# Patient Record
Sex: Female | Born: 1935 | Race: White | Hispanic: No | Smoking: Never smoker
Health system: Southern US, Community
[De-identification: ages and names within clinical notes are randomized; demographics above are authoritative.]

## PROBLEM LIST (undated history)

## (undated) DIAGNOSIS — R1310 Dysphagia, unspecified: Secondary | ICD-10-CM

## (undated) DIAGNOSIS — I10 Essential (primary) hypertension: Secondary | ICD-10-CM

## (undated) DIAGNOSIS — E785 Hyperlipidemia, unspecified: Secondary | ICD-10-CM

## (undated) DIAGNOSIS — M1990 Unspecified osteoarthritis, unspecified site: Secondary | ICD-10-CM

## (undated) DIAGNOSIS — I2510 Atherosclerotic heart disease of native coronary artery without angina pectoris: Secondary | ICD-10-CM

## (undated) DIAGNOSIS — I517 Cardiomegaly: Secondary | ICD-10-CM

## (undated) DIAGNOSIS — T7840XA Allergy, unspecified, initial encounter: Secondary | ICD-10-CM

## (undated) DIAGNOSIS — R0600 Dyspnea, unspecified: Secondary | ICD-10-CM

## (undated) DIAGNOSIS — D72820 Lymphocytosis (symptomatic): Secondary | ICD-10-CM

## (undated) DIAGNOSIS — I272 Pulmonary hypertension, unspecified: Secondary | ICD-10-CM

## (undated) DIAGNOSIS — E871 Hypo-osmolality and hyponatremia: Secondary | ICD-10-CM

## (undated) DIAGNOSIS — M810 Age-related osteoporosis without current pathological fracture: Secondary | ICD-10-CM

## (undated) DIAGNOSIS — E781 Pure hyperglyceridemia: Secondary | ICD-10-CM

## (undated) DIAGNOSIS — N289 Disorder of kidney and ureter, unspecified: Secondary | ICD-10-CM

## (undated) DIAGNOSIS — R809 Proteinuria, unspecified: Secondary | ICD-10-CM

## (undated) DIAGNOSIS — F419 Anxiety disorder, unspecified: Secondary | ICD-10-CM

## (undated) DIAGNOSIS — D72821 Monocytosis (symptomatic): Secondary | ICD-10-CM

## (undated) DIAGNOSIS — I509 Heart failure, unspecified: Secondary | ICD-10-CM

## (undated) DIAGNOSIS — J449 Chronic obstructive pulmonary disease, unspecified: Secondary | ICD-10-CM

## (undated) HISTORY — DX: Atherosclerotic heart disease of native coronary artery without angina pectoris: I25.10

## (undated) HISTORY — DX: Dysphagia, unspecified: R13.10

## (undated) HISTORY — PX: TUBAL LIGATION: SHX77

## (undated) HISTORY — DX: Pulmonary hypertension, unspecified: I27.20

## (undated) HISTORY — PX: OTHER SURGICAL HISTORY: SHX169

## (undated) HISTORY — DX: Allergy, unspecified, initial encounter: T78.40XA

## (undated) HISTORY — DX: Anxiety disorder, unspecified: F41.9

## (undated) HISTORY — DX: Pure hyperglyceridemia: E78.1

## (undated) HISTORY — DX: Chronic obstructive pulmonary disease, unspecified: J44.9

## (undated) HISTORY — DX: Lymphocytosis (symptomatic): D72.820

## (undated) HISTORY — DX: Hypo-osmolality and hyponatremia: E87.1

## (undated) HISTORY — PX: APPENDECTOMY: SHX54

## (undated) HISTORY — DX: Proteinuria, unspecified: R80.9

## (undated) HISTORY — PX: TONSILLECTOMY: SUR1361

## (undated) HISTORY — DX: Age-related osteoporosis without current pathological fracture: M81.0

## (undated) HISTORY — DX: Dyspnea, unspecified: R06.00

## (undated) HISTORY — DX: Monocytosis (symptomatic): D72.821

## (undated) HISTORY — DX: Hyperlipidemia, unspecified: E78.5

## (undated) HISTORY — DX: Cardiomegaly: I51.7

---

## 2003-06-06 ENCOUNTER — Inpatient Hospital Stay (HOSPITAL_COMMUNITY): Admission: EM | Admit: 2003-06-06 | Discharge: 2003-06-07 | Payer: Self-pay | Admitting: Cardiovascular Disease

## 2004-04-18 ENCOUNTER — Other Ambulatory Visit: Payer: Self-pay

## 2004-04-22 ENCOUNTER — Other Ambulatory Visit: Payer: Self-pay

## 2004-04-23 ENCOUNTER — Other Ambulatory Visit: Payer: Self-pay

## 2004-05-09 ENCOUNTER — Other Ambulatory Visit: Payer: Self-pay

## 2004-05-10 ENCOUNTER — Other Ambulatory Visit: Payer: Self-pay

## 2005-08-27 ENCOUNTER — Ambulatory Visit: Payer: Self-pay | Admitting: Cardiovascular Disease

## 2005-09-01 ENCOUNTER — Ambulatory Visit: Payer: Self-pay | Admitting: Cardiovascular Disease

## 2006-05-18 ENCOUNTER — Ambulatory Visit: Payer: Self-pay | Admitting: Ophthalmology

## 2006-05-25 ENCOUNTER — Ambulatory Visit: Payer: Self-pay | Admitting: Ophthalmology

## 2007-08-18 ENCOUNTER — Ambulatory Visit: Payer: Self-pay | Admitting: Specialist

## 2007-11-07 ENCOUNTER — Inpatient Hospital Stay: Payer: Self-pay | Admitting: Cardiovascular Disease

## 2007-11-07 ENCOUNTER — Other Ambulatory Visit: Payer: Self-pay

## 2008-01-31 ENCOUNTER — Ambulatory Visit: Payer: Self-pay | Admitting: Gastroenterology

## 2009-05-14 ENCOUNTER — Ambulatory Visit: Payer: Self-pay | Admitting: Gastroenterology

## 2009-05-26 ENCOUNTER — Ambulatory Visit: Payer: Self-pay | Admitting: Gastroenterology

## 2009-09-11 ENCOUNTER — Inpatient Hospital Stay: Payer: Self-pay | Admitting: Internal Medicine

## 2009-09-11 ENCOUNTER — Ambulatory Visit: Payer: Self-pay | Admitting: Internal Medicine

## 2010-03-05 ENCOUNTER — Emergency Department: Payer: Self-pay | Admitting: Emergency Medicine

## 2010-06-28 ENCOUNTER — Emergency Department: Payer: Self-pay | Admitting: Emergency Medicine

## 2010-12-14 ENCOUNTER — Ambulatory Visit: Payer: Self-pay

## 2011-02-08 ENCOUNTER — Ambulatory Visit: Payer: Self-pay | Admitting: Nephrology

## 2011-04-28 ENCOUNTER — Ambulatory Visit: Payer: Self-pay | Admitting: Cardiovascular Disease

## 2011-10-07 DIAGNOSIS — R0789 Other chest pain: Secondary | ICD-10-CM | POA: Diagnosis not present

## 2011-10-07 DIAGNOSIS — I2510 Atherosclerotic heart disease of native coronary artery without angina pectoris: Secondary | ICD-10-CM | POA: Diagnosis not present

## 2011-10-07 DIAGNOSIS — E785 Hyperlipidemia, unspecified: Secondary | ICD-10-CM | POA: Diagnosis not present

## 2011-10-07 DIAGNOSIS — I10 Essential (primary) hypertension: Secondary | ICD-10-CM | POA: Diagnosis not present

## 2011-12-06 DIAGNOSIS — J019 Acute sinusitis, unspecified: Secondary | ICD-10-CM | POA: Diagnosis not present

## 2011-12-23 DIAGNOSIS — R079 Chest pain, unspecified: Secondary | ICD-10-CM | POA: Diagnosis not present

## 2011-12-23 DIAGNOSIS — E785 Hyperlipidemia, unspecified: Secondary | ICD-10-CM | POA: Diagnosis not present

## 2011-12-23 DIAGNOSIS — I2510 Atherosclerotic heart disease of native coronary artery without angina pectoris: Secondary | ICD-10-CM | POA: Diagnosis not present

## 2011-12-23 DIAGNOSIS — I10 Essential (primary) hypertension: Secondary | ICD-10-CM | POA: Diagnosis not present

## 2011-12-28 DIAGNOSIS — R55 Syncope and collapse: Secondary | ICD-10-CM | POA: Diagnosis not present

## 2012-01-06 DIAGNOSIS — I359 Nonrheumatic aortic valve disorder, unspecified: Secondary | ICD-10-CM | POA: Diagnosis not present

## 2012-01-06 DIAGNOSIS — N179 Acute kidney failure, unspecified: Secondary | ICD-10-CM | POA: Diagnosis not present

## 2012-01-12 DIAGNOSIS — I2510 Atherosclerotic heart disease of native coronary artery without angina pectoris: Secondary | ICD-10-CM | POA: Diagnosis not present

## 2012-01-12 DIAGNOSIS — I10 Essential (primary) hypertension: Secondary | ICD-10-CM | POA: Diagnosis not present

## 2012-01-12 DIAGNOSIS — E785 Hyperlipidemia, unspecified: Secondary | ICD-10-CM | POA: Diagnosis not present

## 2012-01-12 DIAGNOSIS — R5383 Other fatigue: Secondary | ICD-10-CM | POA: Diagnosis not present

## 2012-01-12 DIAGNOSIS — Z0000 Encounter for general adult medical examination without abnormal findings: Secondary | ICD-10-CM | POA: Diagnosis not present

## 2012-01-12 DIAGNOSIS — R5381 Other malaise: Secondary | ICD-10-CM | POA: Diagnosis not present

## 2012-01-12 DIAGNOSIS — H6120 Impacted cerumen, unspecified ear: Secondary | ICD-10-CM | POA: Diagnosis not present

## 2012-02-10 DIAGNOSIS — I2510 Atherosclerotic heart disease of native coronary artery without angina pectoris: Secondary | ICD-10-CM | POA: Diagnosis not present

## 2012-02-10 DIAGNOSIS — R011 Cardiac murmur, unspecified: Secondary | ICD-10-CM | POA: Diagnosis not present

## 2012-02-10 DIAGNOSIS — I10 Essential (primary) hypertension: Secondary | ICD-10-CM | POA: Diagnosis not present

## 2012-02-10 DIAGNOSIS — E785 Hyperlipidemia, unspecified: Secondary | ICD-10-CM | POA: Diagnosis not present

## 2012-02-10 DIAGNOSIS — R9431 Abnormal electrocardiogram [ECG] [EKG]: Secondary | ICD-10-CM | POA: Diagnosis not present

## 2012-02-17 DIAGNOSIS — R002 Palpitations: Secondary | ICD-10-CM | POA: Diagnosis not present

## 2012-02-17 DIAGNOSIS — R42 Dizziness and giddiness: Secondary | ICD-10-CM | POA: Diagnosis not present

## 2012-02-24 ENCOUNTER — Ambulatory Visit: Payer: Self-pay | Admitting: Family Medicine

## 2012-02-24 DIAGNOSIS — I10 Essential (primary) hypertension: Secondary | ICD-10-CM | POA: Diagnosis not present

## 2012-02-24 DIAGNOSIS — R609 Edema, unspecified: Secondary | ICD-10-CM | POA: Diagnosis not present

## 2012-02-24 DIAGNOSIS — M7989 Other specified soft tissue disorders: Secondary | ICD-10-CM | POA: Diagnosis not present

## 2012-02-24 DIAGNOSIS — M259 Joint disorder, unspecified: Secondary | ICD-10-CM | POA: Diagnosis not present

## 2012-02-24 DIAGNOSIS — M25579 Pain in unspecified ankle and joints of unspecified foot: Secondary | ICD-10-CM | POA: Diagnosis not present

## 2012-02-24 DIAGNOSIS — G8929 Other chronic pain: Secondary | ICD-10-CM | POA: Diagnosis not present

## 2012-02-24 DIAGNOSIS — R937 Abnormal findings on diagnostic imaging of other parts of musculoskeletal system: Secondary | ICD-10-CM | POA: Diagnosis not present

## 2012-03-17 DIAGNOSIS — R002 Palpitations: Secondary | ICD-10-CM | POA: Diagnosis not present

## 2012-03-17 DIAGNOSIS — R0602 Shortness of breath: Secondary | ICD-10-CM | POA: Diagnosis not present

## 2012-03-17 DIAGNOSIS — I2510 Atherosclerotic heart disease of native coronary artery without angina pectoris: Secondary | ICD-10-CM | POA: Diagnosis not present

## 2012-03-17 DIAGNOSIS — I10 Essential (primary) hypertension: Secondary | ICD-10-CM | POA: Diagnosis not present

## 2012-03-20 DIAGNOSIS — R002 Palpitations: Secondary | ICD-10-CM | POA: Diagnosis not present

## 2012-03-28 DIAGNOSIS — I10 Essential (primary) hypertension: Secondary | ICD-10-CM | POA: Diagnosis not present

## 2012-03-28 DIAGNOSIS — E785 Hyperlipidemia, unspecified: Secondary | ICD-10-CM | POA: Diagnosis not present

## 2012-03-28 DIAGNOSIS — I2510 Atherosclerotic heart disease of native coronary artery without angina pectoris: Secondary | ICD-10-CM | POA: Diagnosis not present

## 2012-03-29 DIAGNOSIS — R05 Cough: Secondary | ICD-10-CM | POA: Diagnosis not present

## 2012-04-27 DIAGNOSIS — R5381 Other malaise: Secondary | ICD-10-CM | POA: Diagnosis not present

## 2012-04-27 DIAGNOSIS — E785 Hyperlipidemia, unspecified: Secondary | ICD-10-CM | POA: Diagnosis not present

## 2012-04-27 DIAGNOSIS — I2510 Atherosclerotic heart disease of native coronary artery without angina pectoris: Secondary | ICD-10-CM | POA: Diagnosis not present

## 2012-04-27 DIAGNOSIS — I10 Essential (primary) hypertension: Secondary | ICD-10-CM | POA: Diagnosis not present

## 2012-04-27 DIAGNOSIS — IMO0001 Reserved for inherently not codable concepts without codable children: Secondary | ICD-10-CM | POA: Diagnosis not present

## 2012-05-25 DIAGNOSIS — G4710 Hypersomnia, unspecified: Secondary | ICD-10-CM | POA: Diagnosis not present

## 2012-07-04 DIAGNOSIS — I10 Essential (primary) hypertension: Secondary | ICD-10-CM | POA: Diagnosis not present

## 2012-07-04 DIAGNOSIS — E785 Hyperlipidemia, unspecified: Secondary | ICD-10-CM | POA: Diagnosis not present

## 2012-07-06 DIAGNOSIS — E785 Hyperlipidemia, unspecified: Secondary | ICD-10-CM | POA: Diagnosis not present

## 2012-07-06 DIAGNOSIS — I10 Essential (primary) hypertension: Secondary | ICD-10-CM | POA: Diagnosis not present

## 2012-07-06 DIAGNOSIS — I2510 Atherosclerotic heart disease of native coronary artery without angina pectoris: Secondary | ICD-10-CM | POA: Diagnosis not present

## 2012-07-06 DIAGNOSIS — Z23 Encounter for immunization: Secondary | ICD-10-CM | POA: Diagnosis not present

## 2012-07-20 DIAGNOSIS — G4761 Periodic limb movement disorder: Secondary | ICD-10-CM | POA: Diagnosis not present

## 2012-07-20 DIAGNOSIS — G4733 Obstructive sleep apnea (adult) (pediatric): Secondary | ICD-10-CM | POA: Diagnosis not present

## 2012-07-31 DIAGNOSIS — D239 Other benign neoplasm of skin, unspecified: Secondary | ICD-10-CM | POA: Diagnosis not present

## 2012-07-31 DIAGNOSIS — L821 Other seborrheic keratosis: Secondary | ICD-10-CM | POA: Diagnosis not present

## 2012-07-31 DIAGNOSIS — L570 Actinic keratosis: Secondary | ICD-10-CM | POA: Diagnosis not present

## 2012-07-31 DIAGNOSIS — D692 Other nonthrombocytopenic purpura: Secondary | ICD-10-CM | POA: Diagnosis not present

## 2012-07-31 DIAGNOSIS — D1800 Hemangioma unspecified site: Secondary | ICD-10-CM | POA: Diagnosis not present

## 2012-07-31 DIAGNOSIS — L723 Sebaceous cyst: Secondary | ICD-10-CM | POA: Diagnosis not present

## 2012-07-31 DIAGNOSIS — L820 Inflamed seborrheic keratosis: Secondary | ICD-10-CM | POA: Diagnosis not present

## 2012-07-31 DIAGNOSIS — I8310 Varicose veins of unspecified lower extremity with inflammation: Secondary | ICD-10-CM | POA: Diagnosis not present

## 2012-08-10 DIAGNOSIS — J019 Acute sinusitis, unspecified: Secondary | ICD-10-CM | POA: Diagnosis not present

## 2012-08-11 ENCOUNTER — Ambulatory Visit: Payer: Self-pay | Admitting: Family Medicine

## 2012-08-11 DIAGNOSIS — J3489 Other specified disorders of nose and nasal sinuses: Secondary | ICD-10-CM | POA: Diagnosis not present

## 2012-08-11 DIAGNOSIS — R059 Cough, unspecified: Secondary | ICD-10-CM | POA: Diagnosis not present

## 2012-08-11 DIAGNOSIS — R05 Cough: Secondary | ICD-10-CM | POA: Diagnosis not present

## 2012-08-11 DIAGNOSIS — R0989 Other specified symptoms and signs involving the circulatory and respiratory systems: Secondary | ICD-10-CM | POA: Diagnosis not present

## 2012-08-11 DIAGNOSIS — R5381 Other malaise: Secondary | ICD-10-CM | POA: Diagnosis not present

## 2012-08-16 DIAGNOSIS — G4733 Obstructive sleep apnea (adult) (pediatric): Secondary | ICD-10-CM | POA: Diagnosis not present

## 2012-08-18 DIAGNOSIS — H04129 Dry eye syndrome of unspecified lacrimal gland: Secondary | ICD-10-CM | POA: Diagnosis not present

## 2012-08-24 DIAGNOSIS — I2510 Atherosclerotic heart disease of native coronary artery without angina pectoris: Secondary | ICD-10-CM | POA: Diagnosis not present

## 2012-08-24 DIAGNOSIS — E785 Hyperlipidemia, unspecified: Secondary | ICD-10-CM | POA: Diagnosis not present

## 2012-08-24 DIAGNOSIS — I10 Essential (primary) hypertension: Secondary | ICD-10-CM | POA: Diagnosis not present

## 2012-09-08 DIAGNOSIS — G4700 Insomnia, unspecified: Secondary | ICD-10-CM | POA: Diagnosis not present

## 2012-09-08 DIAGNOSIS — J019 Acute sinusitis, unspecified: Secondary | ICD-10-CM | POA: Diagnosis not present

## 2012-09-26 DIAGNOSIS — J019 Acute sinusitis, unspecified: Secondary | ICD-10-CM | POA: Diagnosis not present

## 2012-09-26 DIAGNOSIS — R5383 Other fatigue: Secondary | ICD-10-CM | POA: Diagnosis not present

## 2012-09-26 DIAGNOSIS — R5381 Other malaise: Secondary | ICD-10-CM | POA: Diagnosis not present

## 2012-10-11 DIAGNOSIS — R209 Unspecified disturbances of skin sensation: Secondary | ICD-10-CM | POA: Diagnosis not present

## 2012-10-11 DIAGNOSIS — I2510 Atherosclerotic heart disease of native coronary artery without angina pectoris: Secondary | ICD-10-CM | POA: Diagnosis not present

## 2012-10-11 DIAGNOSIS — R002 Palpitations: Secondary | ICD-10-CM | POA: Diagnosis not present

## 2012-10-11 DIAGNOSIS — I10 Essential (primary) hypertension: Secondary | ICD-10-CM | POA: Diagnosis not present

## 2012-10-12 DIAGNOSIS — R51 Headache: Secondary | ICD-10-CM | POA: Diagnosis not present

## 2012-10-12 DIAGNOSIS — J329 Chronic sinusitis, unspecified: Secondary | ICD-10-CM | POA: Diagnosis not present

## 2012-10-13 DIAGNOSIS — E785 Hyperlipidemia, unspecified: Secondary | ICD-10-CM | POA: Diagnosis not present

## 2012-10-13 DIAGNOSIS — G4730 Sleep apnea, unspecified: Secondary | ICD-10-CM | POA: Diagnosis not present

## 2012-10-13 DIAGNOSIS — I10 Essential (primary) hypertension: Secondary | ICD-10-CM | POA: Diagnosis not present

## 2012-10-13 DIAGNOSIS — I2510 Atherosclerotic heart disease of native coronary artery without angina pectoris: Secondary | ICD-10-CM | POA: Diagnosis not present

## 2012-10-17 DIAGNOSIS — G4733 Obstructive sleep apnea (adult) (pediatric): Secondary | ICD-10-CM | POA: Diagnosis not present

## 2012-10-17 DIAGNOSIS — R0982 Postnasal drip: Secondary | ICD-10-CM | POA: Diagnosis not present

## 2012-10-17 DIAGNOSIS — H6120 Impacted cerumen, unspecified ear: Secondary | ICD-10-CM | POA: Diagnosis not present

## 2012-10-17 DIAGNOSIS — J310 Chronic rhinitis: Secondary | ICD-10-CM | POA: Diagnosis not present

## 2012-10-30 DIAGNOSIS — I2510 Atherosclerotic heart disease of native coronary artery without angina pectoris: Secondary | ICD-10-CM | POA: Diagnosis not present

## 2012-10-30 DIAGNOSIS — G4730 Sleep apnea, unspecified: Secondary | ICD-10-CM | POA: Diagnosis not present

## 2012-10-30 DIAGNOSIS — I10 Essential (primary) hypertension: Secondary | ICD-10-CM | POA: Diagnosis not present

## 2012-10-30 DIAGNOSIS — E785 Hyperlipidemia, unspecified: Secondary | ICD-10-CM | POA: Diagnosis not present

## 2012-12-12 DIAGNOSIS — G4730 Sleep apnea, unspecified: Secondary | ICD-10-CM | POA: Diagnosis not present

## 2012-12-12 DIAGNOSIS — I2510 Atherosclerotic heart disease of native coronary artery without angina pectoris: Secondary | ICD-10-CM | POA: Diagnosis not present

## 2012-12-12 DIAGNOSIS — I10 Essential (primary) hypertension: Secondary | ICD-10-CM | POA: Diagnosis not present

## 2012-12-12 DIAGNOSIS — Z79899 Other long term (current) drug therapy: Secondary | ICD-10-CM | POA: Diagnosis not present

## 2012-12-12 DIAGNOSIS — E785 Hyperlipidemia, unspecified: Secondary | ICD-10-CM | POA: Diagnosis not present

## 2012-12-12 DIAGNOSIS — R0602 Shortness of breath: Secondary | ICD-10-CM | POA: Diagnosis not present

## 2012-12-21 DIAGNOSIS — R072 Precordial pain: Secondary | ICD-10-CM | POA: Diagnosis not present

## 2012-12-28 DIAGNOSIS — I2510 Atherosclerotic heart disease of native coronary artery without angina pectoris: Secondary | ICD-10-CM | POA: Diagnosis not present

## 2012-12-28 DIAGNOSIS — G4730 Sleep apnea, unspecified: Secondary | ICD-10-CM | POA: Diagnosis not present

## 2012-12-28 DIAGNOSIS — E785 Hyperlipidemia, unspecified: Secondary | ICD-10-CM | POA: Diagnosis not present

## 2012-12-28 DIAGNOSIS — I10 Essential (primary) hypertension: Secondary | ICD-10-CM | POA: Diagnosis not present

## 2013-01-29 DIAGNOSIS — L820 Inflamed seborrheic keratosis: Secondary | ICD-10-CM | POA: Diagnosis not present

## 2013-01-29 DIAGNOSIS — I8310 Varicose veins of unspecified lower extremity with inflammation: Secondary | ICD-10-CM | POA: Diagnosis not present

## 2013-01-29 DIAGNOSIS — D239 Other benign neoplasm of skin, unspecified: Secondary | ICD-10-CM | POA: Diagnosis not present

## 2013-01-29 DIAGNOSIS — L570 Actinic keratosis: Secondary | ICD-10-CM | POA: Diagnosis not present

## 2013-01-29 DIAGNOSIS — L821 Other seborrheic keratosis: Secondary | ICD-10-CM | POA: Diagnosis not present

## 2013-01-29 DIAGNOSIS — Z85828 Personal history of other malignant neoplasm of skin: Secondary | ICD-10-CM | POA: Diagnosis not present

## 2013-01-30 DIAGNOSIS — G4730 Sleep apnea, unspecified: Secondary | ICD-10-CM | POA: Diagnosis not present

## 2013-01-30 DIAGNOSIS — I2510 Atherosclerotic heart disease of native coronary artery without angina pectoris: Secondary | ICD-10-CM | POA: Diagnosis not present

## 2013-01-30 DIAGNOSIS — I10 Essential (primary) hypertension: Secondary | ICD-10-CM | POA: Diagnosis not present

## 2013-01-30 DIAGNOSIS — E785 Hyperlipidemia, unspecified: Secondary | ICD-10-CM | POA: Diagnosis not present

## 2013-02-02 DIAGNOSIS — R9430 Abnormal result of cardiovascular function study, unspecified: Secondary | ICD-10-CM | POA: Diagnosis not present

## 2013-02-08 DIAGNOSIS — I2510 Atherosclerotic heart disease of native coronary artery without angina pectoris: Secondary | ICD-10-CM | POA: Diagnosis not present

## 2013-02-08 DIAGNOSIS — I10 Essential (primary) hypertension: Secondary | ICD-10-CM | POA: Diagnosis not present

## 2013-02-08 DIAGNOSIS — E785 Hyperlipidemia, unspecified: Secondary | ICD-10-CM | POA: Diagnosis not present

## 2013-02-08 DIAGNOSIS — G4730 Sleep apnea, unspecified: Secondary | ICD-10-CM | POA: Diagnosis not present

## 2013-02-12 DIAGNOSIS — C8177 Other classical Hodgkin lymphoma, spleen: Secondary | ICD-10-CM | POA: Diagnosis not present

## 2013-05-14 DIAGNOSIS — R0602 Shortness of breath: Secondary | ICD-10-CM | POA: Diagnosis not present

## 2013-05-14 DIAGNOSIS — E785 Hyperlipidemia, unspecified: Secondary | ICD-10-CM | POA: Diagnosis not present

## 2013-05-14 DIAGNOSIS — I2510 Atherosclerotic heart disease of native coronary artery without angina pectoris: Secondary | ICD-10-CM | POA: Diagnosis not present

## 2013-05-14 DIAGNOSIS — I10 Essential (primary) hypertension: Secondary | ICD-10-CM | POA: Diagnosis not present

## 2013-06-08 DIAGNOSIS — Z0000 Encounter for general adult medical examination without abnormal findings: Secondary | ICD-10-CM | POA: Diagnosis not present

## 2013-06-08 DIAGNOSIS — M810 Age-related osteoporosis without current pathological fracture: Secondary | ICD-10-CM | POA: Diagnosis not present

## 2013-06-08 DIAGNOSIS — I10 Essential (primary) hypertension: Secondary | ICD-10-CM | POA: Diagnosis not present

## 2013-06-08 DIAGNOSIS — I2510 Atherosclerotic heart disease of native coronary artery without angina pectoris: Secondary | ICD-10-CM | POA: Diagnosis not present

## 2013-06-08 DIAGNOSIS — E785 Hyperlipidemia, unspecified: Secondary | ICD-10-CM | POA: Diagnosis not present

## 2013-06-08 DIAGNOSIS — F411 Generalized anxiety disorder: Secondary | ICD-10-CM | POA: Diagnosis not present

## 2013-06-12 DIAGNOSIS — Z23 Encounter for immunization: Secondary | ICD-10-CM | POA: Diagnosis not present

## 2013-06-12 DIAGNOSIS — I2510 Atherosclerotic heart disease of native coronary artery without angina pectoris: Secondary | ICD-10-CM | POA: Diagnosis not present

## 2013-06-12 DIAGNOSIS — Z0000 Encounter for general adult medical examination without abnormal findings: Secondary | ICD-10-CM | POA: Diagnosis not present

## 2013-06-21 ENCOUNTER — Ambulatory Visit: Payer: Self-pay | Admitting: Family Medicine

## 2013-06-21 DIAGNOSIS — M810 Age-related osteoporosis without current pathological fracture: Secondary | ICD-10-CM | POA: Diagnosis not present

## 2013-07-13 DIAGNOSIS — B9789 Other viral agents as the cause of diseases classified elsewhere: Secondary | ICD-10-CM | POA: Diagnosis not present

## 2013-07-13 DIAGNOSIS — H6120 Impacted cerumen, unspecified ear: Secondary | ICD-10-CM | POA: Diagnosis not present

## 2013-07-18 DIAGNOSIS — J069 Acute upper respiratory infection, unspecified: Secondary | ICD-10-CM | POA: Diagnosis not present

## 2013-07-18 DIAGNOSIS — R0602 Shortness of breath: Secondary | ICD-10-CM | POA: Diagnosis not present

## 2013-07-31 DIAGNOSIS — D485 Neoplasm of uncertain behavior of skin: Secondary | ICD-10-CM | POA: Diagnosis not present

## 2013-07-31 DIAGNOSIS — C44519 Basal cell carcinoma of skin of other part of trunk: Secondary | ICD-10-CM | POA: Diagnosis not present

## 2013-07-31 DIAGNOSIS — L570 Actinic keratosis: Secondary | ICD-10-CM | POA: Diagnosis not present

## 2013-07-31 DIAGNOSIS — L821 Other seborrheic keratosis: Secondary | ICD-10-CM | POA: Diagnosis not present

## 2013-07-31 DIAGNOSIS — Z85828 Personal history of other malignant neoplasm of skin: Secondary | ICD-10-CM | POA: Diagnosis not present

## 2013-07-31 DIAGNOSIS — D239 Other benign neoplasm of skin, unspecified: Secondary | ICD-10-CM | POA: Diagnosis not present

## 2013-09-10 DIAGNOSIS — D239 Other benign neoplasm of skin, unspecified: Secondary | ICD-10-CM | POA: Diagnosis not present

## 2013-09-10 DIAGNOSIS — C44319 Basal cell carcinoma of skin of other parts of face: Secondary | ICD-10-CM | POA: Diagnosis not present

## 2013-09-10 DIAGNOSIS — Z85828 Personal history of other malignant neoplasm of skin: Secondary | ICD-10-CM | POA: Diagnosis not present

## 2013-09-13 DIAGNOSIS — E785 Hyperlipidemia, unspecified: Secondary | ICD-10-CM | POA: Diagnosis not present

## 2013-09-13 DIAGNOSIS — I2510 Atherosclerotic heart disease of native coronary artery without angina pectoris: Secondary | ICD-10-CM | POA: Diagnosis not present

## 2013-09-13 DIAGNOSIS — I10 Essential (primary) hypertension: Secondary | ICD-10-CM | POA: Diagnosis not present

## 2013-09-13 DIAGNOSIS — K3189 Other diseases of stomach and duodenum: Secondary | ICD-10-CM | POA: Diagnosis not present

## 2013-09-13 DIAGNOSIS — R072 Precordial pain: Secondary | ICD-10-CM | POA: Diagnosis not present

## 2013-10-16 DIAGNOSIS — J449 Chronic obstructive pulmonary disease, unspecified: Secondary | ICD-10-CM | POA: Diagnosis not present

## 2013-10-22 DIAGNOSIS — J449 Chronic obstructive pulmonary disease, unspecified: Secondary | ICD-10-CM | POA: Diagnosis not present

## 2013-10-22 DIAGNOSIS — I10 Essential (primary) hypertension: Secondary | ICD-10-CM | POA: Diagnosis not present

## 2013-10-22 DIAGNOSIS — E559 Vitamin D deficiency, unspecified: Secondary | ICD-10-CM | POA: Diagnosis not present

## 2013-10-22 DIAGNOSIS — R0602 Shortness of breath: Secondary | ICD-10-CM | POA: Diagnosis not present

## 2013-10-22 DIAGNOSIS — E785 Hyperlipidemia, unspecified: Secondary | ICD-10-CM | POA: Diagnosis not present

## 2013-10-22 DIAGNOSIS — D518 Other vitamin B12 deficiency anemias: Secondary | ICD-10-CM | POA: Diagnosis not present

## 2013-10-25 ENCOUNTER — Ambulatory Visit: Payer: Self-pay | Admitting: Family Medicine

## 2013-10-25 DIAGNOSIS — K449 Diaphragmatic hernia without obstruction or gangrene: Secondary | ICD-10-CM | POA: Diagnosis not present

## 2013-10-25 DIAGNOSIS — R0609 Other forms of dyspnea: Secondary | ICD-10-CM | POA: Diagnosis not present

## 2013-10-25 DIAGNOSIS — E785 Hyperlipidemia, unspecified: Secondary | ICD-10-CM | POA: Diagnosis not present

## 2013-10-25 DIAGNOSIS — Q619 Cystic kidney disease, unspecified: Secondary | ICD-10-CM | POA: Diagnosis not present

## 2013-10-25 DIAGNOSIS — G4730 Sleep apnea, unspecified: Secondary | ICD-10-CM | POA: Diagnosis not present

## 2013-10-25 DIAGNOSIS — K7689 Other specified diseases of liver: Secondary | ICD-10-CM | POA: Diagnosis not present

## 2013-10-25 DIAGNOSIS — I10 Essential (primary) hypertension: Secondary | ICD-10-CM | POA: Diagnosis not present

## 2013-10-25 DIAGNOSIS — I2510 Atherosclerotic heart disease of native coronary artery without angina pectoris: Secondary | ICD-10-CM | POA: Diagnosis not present

## 2013-10-25 DIAGNOSIS — R0602 Shortness of breath: Secondary | ICD-10-CM | POA: Diagnosis not present

## 2013-10-25 DIAGNOSIS — R1310 Dysphagia, unspecified: Secondary | ICD-10-CM | POA: Diagnosis not present

## 2013-10-25 DIAGNOSIS — I517 Cardiomegaly: Secondary | ICD-10-CM | POA: Diagnosis not present

## 2013-10-29 DIAGNOSIS — E236 Other disorders of pituitary gland: Secondary | ICD-10-CM | POA: Diagnosis not present

## 2013-10-29 DIAGNOSIS — R0609 Other forms of dyspnea: Secondary | ICD-10-CM | POA: Diagnosis not present

## 2013-10-29 DIAGNOSIS — I10 Essential (primary) hypertension: Secondary | ICD-10-CM | POA: Diagnosis not present

## 2013-10-29 DIAGNOSIS — E871 Hypo-osmolality and hyponatremia: Secondary | ICD-10-CM | POA: Diagnosis not present

## 2013-10-29 DIAGNOSIS — R809 Proteinuria, unspecified: Secondary | ICD-10-CM | POA: Diagnosis not present

## 2013-10-30 DIAGNOSIS — R5381 Other malaise: Secondary | ICD-10-CM | POA: Diagnosis not present

## 2013-10-30 DIAGNOSIS — J449 Chronic obstructive pulmonary disease, unspecified: Secondary | ICD-10-CM | POA: Diagnosis not present

## 2013-10-30 DIAGNOSIS — R5383 Other fatigue: Secondary | ICD-10-CM | POA: Diagnosis not present

## 2013-10-30 DIAGNOSIS — G4733 Obstructive sleep apnea (adult) (pediatric): Secondary | ICD-10-CM | POA: Diagnosis not present

## 2013-10-30 DIAGNOSIS — R0609 Other forms of dyspnea: Secondary | ICD-10-CM | POA: Diagnosis not present

## 2013-11-01 DIAGNOSIS — R809 Proteinuria, unspecified: Secondary | ICD-10-CM | POA: Diagnosis not present

## 2013-11-01 DIAGNOSIS — R0609 Other forms of dyspnea: Secondary | ICD-10-CM | POA: Diagnosis not present

## 2013-11-01 DIAGNOSIS — I10 Essential (primary) hypertension: Secondary | ICD-10-CM | POA: Diagnosis not present

## 2013-11-06 DIAGNOSIS — J449 Chronic obstructive pulmonary disease, unspecified: Secondary | ICD-10-CM | POA: Diagnosis not present

## 2013-11-06 DIAGNOSIS — R809 Proteinuria, unspecified: Secondary | ICD-10-CM | POA: Diagnosis not present

## 2013-11-06 DIAGNOSIS — D72820 Lymphocytosis (symptomatic): Secondary | ICD-10-CM | POA: Diagnosis not present

## 2013-11-14 ENCOUNTER — Ambulatory Visit: Payer: Self-pay | Admitting: Hematology and Oncology

## 2013-11-14 DIAGNOSIS — J449 Chronic obstructive pulmonary disease, unspecified: Secondary | ICD-10-CM | POA: Diagnosis not present

## 2013-11-14 DIAGNOSIS — Z79899 Other long term (current) drug therapy: Secondary | ICD-10-CM | POA: Diagnosis not present

## 2013-11-14 DIAGNOSIS — Z7982 Long term (current) use of aspirin: Secondary | ICD-10-CM | POA: Diagnosis not present

## 2013-11-14 DIAGNOSIS — Z7902 Long term (current) use of antithrombotics/antiplatelets: Secondary | ICD-10-CM | POA: Diagnosis not present

## 2013-11-14 DIAGNOSIS — E119 Type 2 diabetes mellitus without complications: Secondary | ICD-10-CM | POA: Diagnosis not present

## 2013-11-14 DIAGNOSIS — I10 Essential (primary) hypertension: Secondary | ICD-10-CM | POA: Diagnosis not present

## 2013-11-14 DIAGNOSIS — G2581 Restless legs syndrome: Secondary | ICD-10-CM | POA: Diagnosis not present

## 2013-11-14 DIAGNOSIS — D72829 Elevated white blood cell count, unspecified: Secondary | ICD-10-CM | POA: Diagnosis not present

## 2013-11-15 DIAGNOSIS — D72829 Elevated white blood cell count, unspecified: Secondary | ICD-10-CM | POA: Diagnosis not present

## 2013-11-15 LAB — CBC CANCER CENTER
BASOS ABS: 0.1 x10 3/mm (ref 0.0–0.1)
BASOS PCT: 0.5 %
EOS ABS: 0.1 x10 3/mm (ref 0.0–0.7)
Eosinophil %: 0.7 %
HCT: 35.9 % (ref 35.0–47.0)
HGB: 11.9 g/dL — ABNORMAL LOW (ref 12.0–16.0)
Lymphocyte #: 3.7 x10 3/mm — ABNORMAL HIGH (ref 1.0–3.6)
Lymphocyte %: 35.8 %
MCH: 30.1 pg (ref 26.0–34.0)
MCHC: 33 g/dL (ref 32.0–36.0)
MCV: 91 fL (ref 80–100)
Monocyte #: 0.9 x10 3/mm (ref 0.2–0.9)
Monocyte %: 8.7 %
Neutrophil #: 5.6 x10 3/mm (ref 1.4–6.5)
Neutrophil %: 54.3 %
Platelet: 269 x10 3/mm (ref 150–440)
RBC: 3.95 10*6/uL (ref 3.80–5.20)
RDW: 13.4 % (ref 11.5–14.5)
WBC: 10.3 x10 3/mm (ref 3.6–11.0)

## 2013-12-02 ENCOUNTER — Ambulatory Visit: Payer: Self-pay | Admitting: Hematology and Oncology

## 2013-12-03 DIAGNOSIS — R809 Proteinuria, unspecified: Secondary | ICD-10-CM | POA: Diagnosis not present

## 2013-12-03 DIAGNOSIS — E871 Hypo-osmolality and hyponatremia: Secondary | ICD-10-CM | POA: Diagnosis not present

## 2013-12-03 DIAGNOSIS — I10 Essential (primary) hypertension: Secondary | ICD-10-CM | POA: Diagnosis not present

## 2013-12-07 DIAGNOSIS — J449 Chronic obstructive pulmonary disease, unspecified: Secondary | ICD-10-CM | POA: Diagnosis not present

## 2013-12-07 DIAGNOSIS — G4733 Obstructive sleep apnea (adult) (pediatric): Secondary | ICD-10-CM | POA: Diagnosis not present

## 2013-12-14 DIAGNOSIS — J040 Acute laryngitis: Secondary | ICD-10-CM | POA: Diagnosis not present

## 2013-12-14 DIAGNOSIS — J069 Acute upper respiratory infection, unspecified: Secondary | ICD-10-CM | POA: Diagnosis not present

## 2013-12-24 DIAGNOSIS — E785 Hyperlipidemia, unspecified: Secondary | ICD-10-CM | POA: Diagnosis not present

## 2013-12-24 DIAGNOSIS — I2510 Atherosclerotic heart disease of native coronary artery without angina pectoris: Secondary | ICD-10-CM | POA: Diagnosis not present

## 2013-12-24 DIAGNOSIS — G4730 Sleep apnea, unspecified: Secondary | ICD-10-CM | POA: Diagnosis not present

## 2013-12-24 DIAGNOSIS — I10 Essential (primary) hypertension: Secondary | ICD-10-CM | POA: Diagnosis not present

## 2013-12-24 DIAGNOSIS — J449 Chronic obstructive pulmonary disease, unspecified: Secondary | ICD-10-CM | POA: Diagnosis not present

## 2014-01-29 DIAGNOSIS — L723 Sebaceous cyst: Secondary | ICD-10-CM | POA: Diagnosis not present

## 2014-01-29 DIAGNOSIS — L820 Inflamed seborrheic keratosis: Secondary | ICD-10-CM | POA: Diagnosis not present

## 2014-01-29 DIAGNOSIS — D239 Other benign neoplasm of skin, unspecified: Secondary | ICD-10-CM | POA: Diagnosis not present

## 2014-01-29 DIAGNOSIS — L821 Other seborrheic keratosis: Secondary | ICD-10-CM | POA: Diagnosis not present

## 2014-01-29 DIAGNOSIS — Z85828 Personal history of other malignant neoplasm of skin: Secondary | ICD-10-CM | POA: Diagnosis not present

## 2014-02-06 DIAGNOSIS — J069 Acute upper respiratory infection, unspecified: Secondary | ICD-10-CM | POA: Diagnosis not present

## 2014-02-06 DIAGNOSIS — J040 Acute laryngitis: Secondary | ICD-10-CM | POA: Diagnosis not present

## 2014-02-28 DIAGNOSIS — H04129 Dry eye syndrome of unspecified lacrimal gland: Secondary | ICD-10-CM | POA: Diagnosis not present

## 2014-03-12 DIAGNOSIS — J449 Chronic obstructive pulmonary disease, unspecified: Secondary | ICD-10-CM | POA: Diagnosis not present

## 2014-03-12 DIAGNOSIS — G4733 Obstructive sleep apnea (adult) (pediatric): Secondary | ICD-10-CM | POA: Diagnosis not present

## 2014-03-12 DIAGNOSIS — J471 Bronchiectasis with (acute) exacerbation: Secondary | ICD-10-CM | POA: Diagnosis not present

## 2014-03-15 DIAGNOSIS — L678 Other hair color and hair shaft abnormalities: Secondary | ICD-10-CM | POA: Diagnosis not present

## 2014-03-15 DIAGNOSIS — L738 Other specified follicular disorders: Secondary | ICD-10-CM | POA: Diagnosis not present

## 2014-03-19 DIAGNOSIS — B029 Zoster without complications: Secondary | ICD-10-CM | POA: Diagnosis not present

## 2014-03-19 DIAGNOSIS — M810 Age-related osteoporosis without current pathological fracture: Secondary | ICD-10-CM | POA: Diagnosis not present

## 2014-03-26 DIAGNOSIS — E785 Hyperlipidemia, unspecified: Secondary | ICD-10-CM | POA: Diagnosis not present

## 2014-03-26 DIAGNOSIS — J449 Chronic obstructive pulmonary disease, unspecified: Secondary | ICD-10-CM | POA: Diagnosis not present

## 2014-03-26 DIAGNOSIS — I059 Rheumatic mitral valve disease, unspecified: Secondary | ICD-10-CM | POA: Diagnosis not present

## 2014-03-26 DIAGNOSIS — I10 Essential (primary) hypertension: Secondary | ICD-10-CM | POA: Diagnosis not present

## 2014-03-26 DIAGNOSIS — I2510 Atherosclerotic heart disease of native coronary artery without angina pectoris: Secondary | ICD-10-CM | POA: Diagnosis not present

## 2014-03-26 DIAGNOSIS — G4730 Sleep apnea, unspecified: Secondary | ICD-10-CM | POA: Diagnosis not present

## 2014-05-15 DIAGNOSIS — I2510 Atherosclerotic heart disease of native coronary artery without angina pectoris: Secondary | ICD-10-CM | POA: Diagnosis not present

## 2014-05-15 DIAGNOSIS — M810 Age-related osteoporosis without current pathological fracture: Secondary | ICD-10-CM | POA: Diagnosis not present

## 2014-05-15 DIAGNOSIS — D72820 Lymphocytosis (symptomatic): Secondary | ICD-10-CM | POA: Diagnosis not present

## 2014-05-15 DIAGNOSIS — E785 Hyperlipidemia, unspecified: Secondary | ICD-10-CM | POA: Diagnosis not present

## 2014-05-21 DIAGNOSIS — R269 Unspecified abnormalities of gait and mobility: Secondary | ICD-10-CM | POA: Diagnosis not present

## 2014-05-21 DIAGNOSIS — Z9181 History of falling: Secondary | ICD-10-CM | POA: Diagnosis not present

## 2014-05-24 DIAGNOSIS — R609 Edema, unspecified: Secondary | ICD-10-CM | POA: Diagnosis not present

## 2014-05-24 DIAGNOSIS — R809 Proteinuria, unspecified: Secondary | ICD-10-CM | POA: Diagnosis not present

## 2014-05-24 DIAGNOSIS — Z9181 History of falling: Secondary | ICD-10-CM | POA: Diagnosis not present

## 2014-05-24 DIAGNOSIS — I10 Essential (primary) hypertension: Secondary | ICD-10-CM | POA: Diagnosis not present

## 2014-05-24 DIAGNOSIS — R7309 Other abnormal glucose: Secondary | ICD-10-CM | POA: Diagnosis not present

## 2014-05-24 DIAGNOSIS — E1129 Type 2 diabetes mellitus with other diabetic kidney complication: Secondary | ICD-10-CM | POA: Diagnosis not present

## 2014-05-24 DIAGNOSIS — R269 Unspecified abnormalities of gait and mobility: Secondary | ICD-10-CM | POA: Diagnosis not present

## 2014-05-28 DIAGNOSIS — R269 Unspecified abnormalities of gait and mobility: Secondary | ICD-10-CM | POA: Diagnosis not present

## 2014-05-28 DIAGNOSIS — Z9181 History of falling: Secondary | ICD-10-CM | POA: Diagnosis not present

## 2014-05-31 DIAGNOSIS — Z9181 History of falling: Secondary | ICD-10-CM | POA: Diagnosis not present

## 2014-05-31 DIAGNOSIS — R269 Unspecified abnormalities of gait and mobility: Secondary | ICD-10-CM | POA: Diagnosis not present

## 2014-06-04 DIAGNOSIS — Z9181 History of falling: Secondary | ICD-10-CM | POA: Diagnosis not present

## 2014-06-04 DIAGNOSIS — R269 Unspecified abnormalities of gait and mobility: Secondary | ICD-10-CM | POA: Diagnosis not present

## 2014-06-21 DIAGNOSIS — I2510 Atherosclerotic heart disease of native coronary artery without angina pectoris: Secondary | ICD-10-CM | POA: Diagnosis not present

## 2014-06-21 DIAGNOSIS — R0789 Other chest pain: Secondary | ICD-10-CM | POA: Diagnosis not present

## 2014-06-21 DIAGNOSIS — R51 Headache: Secondary | ICD-10-CM | POA: Diagnosis not present

## 2014-06-21 DIAGNOSIS — I10 Essential (primary) hypertension: Secondary | ICD-10-CM | POA: Diagnosis not present

## 2014-06-21 DIAGNOSIS — G4733 Obstructive sleep apnea (adult) (pediatric): Secondary | ICD-10-CM | POA: Diagnosis not present

## 2014-06-21 DIAGNOSIS — E785 Hyperlipidemia, unspecified: Secondary | ICD-10-CM | POA: Diagnosis not present

## 2014-06-21 DIAGNOSIS — I059 Rheumatic mitral valve disease, unspecified: Secondary | ICD-10-CM | POA: Diagnosis not present

## 2014-06-21 DIAGNOSIS — R0602 Shortness of breath: Secondary | ICD-10-CM | POA: Diagnosis not present

## 2014-06-24 DIAGNOSIS — I10 Essential (primary) hypertension: Secondary | ICD-10-CM | POA: Diagnosis not present

## 2014-06-24 DIAGNOSIS — E785 Hyperlipidemia, unspecified: Secondary | ICD-10-CM | POA: Diagnosis not present

## 2014-06-24 DIAGNOSIS — R0602 Shortness of breath: Secondary | ICD-10-CM | POA: Diagnosis not present

## 2014-06-24 DIAGNOSIS — G4733 Obstructive sleep apnea (adult) (pediatric): Secondary | ICD-10-CM | POA: Diagnosis not present

## 2014-06-24 DIAGNOSIS — I2510 Atherosclerotic heart disease of native coronary artery without angina pectoris: Secondary | ICD-10-CM | POA: Diagnosis not present

## 2014-06-24 DIAGNOSIS — R51 Headache: Secondary | ICD-10-CM | POA: Diagnosis not present

## 2014-06-24 DIAGNOSIS — R0789 Other chest pain: Secondary | ICD-10-CM | POA: Diagnosis not present

## 2014-06-24 DIAGNOSIS — I059 Rheumatic mitral valve disease, unspecified: Secondary | ICD-10-CM | POA: Diagnosis not present

## 2014-06-28 DIAGNOSIS — I2510 Atherosclerotic heart disease of native coronary artery without angina pectoris: Secondary | ICD-10-CM | POA: Diagnosis not present

## 2014-06-28 DIAGNOSIS — E785 Hyperlipidemia, unspecified: Secondary | ICD-10-CM | POA: Diagnosis not present

## 2014-06-28 DIAGNOSIS — R0602 Shortness of breath: Secondary | ICD-10-CM | POA: Diagnosis not present

## 2014-06-28 DIAGNOSIS — I10 Essential (primary) hypertension: Secondary | ICD-10-CM | POA: Diagnosis not present

## 2014-06-28 DIAGNOSIS — G4733 Obstructive sleep apnea (adult) (pediatric): Secondary | ICD-10-CM | POA: Diagnosis not present

## 2014-06-28 DIAGNOSIS — R55 Syncope and collapse: Secondary | ICD-10-CM | POA: Diagnosis not present

## 2014-06-28 DIAGNOSIS — I059 Rheumatic mitral valve disease, unspecified: Secondary | ICD-10-CM | POA: Diagnosis not present

## 2014-07-04 DIAGNOSIS — Z9181 History of falling: Secondary | ICD-10-CM | POA: Diagnosis not present

## 2014-07-04 DIAGNOSIS — R262 Difficulty in walking, not elsewhere classified: Secondary | ICD-10-CM | POA: Diagnosis not present

## 2014-07-09 DIAGNOSIS — Z9181 History of falling: Secondary | ICD-10-CM | POA: Diagnosis not present

## 2014-07-09 DIAGNOSIS — R262 Difficulty in walking, not elsewhere classified: Secondary | ICD-10-CM | POA: Diagnosis not present

## 2014-07-18 ENCOUNTER — Ambulatory Visit: Payer: Self-pay | Admitting: Family Medicine

## 2014-07-18 DIAGNOSIS — R079 Chest pain, unspecified: Secondary | ICD-10-CM | POA: Diagnosis not present

## 2014-07-18 DIAGNOSIS — J180 Bronchopneumonia, unspecified organism: Secondary | ICD-10-CM | POA: Diagnosis not present

## 2014-07-18 DIAGNOSIS — R05 Cough: Secondary | ICD-10-CM | POA: Diagnosis not present

## 2014-07-23 DIAGNOSIS — R262 Difficulty in walking, not elsewhere classified: Secondary | ICD-10-CM | POA: Diagnosis not present

## 2014-07-23 DIAGNOSIS — Z9181 History of falling: Secondary | ICD-10-CM | POA: Diagnosis not present

## 2014-07-25 DIAGNOSIS — Z9181 History of falling: Secondary | ICD-10-CM | POA: Diagnosis not present

## 2014-07-25 DIAGNOSIS — R262 Difficulty in walking, not elsewhere classified: Secondary | ICD-10-CM | POA: Diagnosis not present

## 2014-07-30 DIAGNOSIS — D229 Melanocytic nevi, unspecified: Secondary | ICD-10-CM | POA: Diagnosis not present

## 2014-07-30 DIAGNOSIS — Z9181 History of falling: Secondary | ICD-10-CM | POA: Diagnosis not present

## 2014-07-30 DIAGNOSIS — R262 Difficulty in walking, not elsewhere classified: Secondary | ICD-10-CM | POA: Diagnosis not present

## 2014-07-30 DIAGNOSIS — D1800 Hemangioma unspecified site: Secondary | ICD-10-CM | POA: Diagnosis not present

## 2014-07-30 DIAGNOSIS — L570 Actinic keratosis: Secondary | ICD-10-CM | POA: Diagnosis not present

## 2014-07-30 DIAGNOSIS — L720 Epidermal cyst: Secondary | ICD-10-CM | POA: Diagnosis not present

## 2014-07-30 DIAGNOSIS — L814 Other melanin hyperpigmentation: Secondary | ICD-10-CM | POA: Diagnosis not present

## 2014-07-30 DIAGNOSIS — L820 Inflamed seborrheic keratosis: Secondary | ICD-10-CM | POA: Diagnosis not present

## 2014-07-30 DIAGNOSIS — Z1283 Encounter for screening for malignant neoplasm of skin: Secondary | ICD-10-CM | POA: Diagnosis not present

## 2014-07-30 DIAGNOSIS — Z85828 Personal history of other malignant neoplasm of skin: Secondary | ICD-10-CM | POA: Diagnosis not present

## 2014-08-01 DIAGNOSIS — R262 Difficulty in walking, not elsewhere classified: Secondary | ICD-10-CM | POA: Diagnosis not present

## 2014-08-01 DIAGNOSIS — Z9181 History of falling: Secondary | ICD-10-CM | POA: Diagnosis not present

## 2014-08-04 DIAGNOSIS — Z9181 History of falling: Secondary | ICD-10-CM | POA: Diagnosis not present

## 2014-08-04 DIAGNOSIS — R262 Difficulty in walking, not elsewhere classified: Secondary | ICD-10-CM | POA: Diagnosis not present

## 2014-08-14 DIAGNOSIS — Z23 Encounter for immunization: Secondary | ICD-10-CM | POA: Diagnosis not present

## 2014-08-22 DIAGNOSIS — I10 Essential (primary) hypertension: Secondary | ICD-10-CM | POA: Diagnosis not present

## 2014-08-22 DIAGNOSIS — R809 Proteinuria, unspecified: Secondary | ICD-10-CM | POA: Diagnosis not present

## 2014-08-22 DIAGNOSIS — R609 Edema, unspecified: Secondary | ICD-10-CM | POA: Diagnosis not present

## 2014-08-22 DIAGNOSIS — N183 Chronic kidney disease, stage 3 (moderate): Secondary | ICD-10-CM | POA: Diagnosis not present

## 2014-09-10 DIAGNOSIS — J449 Chronic obstructive pulmonary disease, unspecified: Secondary | ICD-10-CM | POA: Diagnosis not present

## 2014-09-10 DIAGNOSIS — R0602 Shortness of breath: Secondary | ICD-10-CM | POA: Diagnosis not present

## 2014-09-10 DIAGNOSIS — E6609 Other obesity due to excess calories: Secondary | ICD-10-CM | POA: Diagnosis not present

## 2014-09-10 DIAGNOSIS — J479 Bronchiectasis, uncomplicated: Secondary | ICD-10-CM | POA: Diagnosis not present

## 2014-11-05 DIAGNOSIS — E785 Hyperlipidemia, unspecified: Secondary | ICD-10-CM | POA: Diagnosis not present

## 2014-11-05 DIAGNOSIS — G4733 Obstructive sleep apnea (adult) (pediatric): Secondary | ICD-10-CM | POA: Diagnosis not present

## 2014-11-05 DIAGNOSIS — I340 Nonrheumatic mitral (valve) insufficiency: Secondary | ICD-10-CM | POA: Diagnosis not present

## 2014-11-05 DIAGNOSIS — I2510 Atherosclerotic heart disease of native coronary artery without angina pectoris: Secondary | ICD-10-CM | POA: Diagnosis not present

## 2014-11-05 DIAGNOSIS — I10 Essential (primary) hypertension: Secondary | ICD-10-CM | POA: Diagnosis not present

## 2014-11-11 DIAGNOSIS — I10 Essential (primary) hypertension: Secondary | ICD-10-CM | POA: Diagnosis not present

## 2014-11-11 DIAGNOSIS — D72820 Lymphocytosis (symptomatic): Secondary | ICD-10-CM | POA: Diagnosis not present

## 2014-11-11 DIAGNOSIS — I2510 Atherosclerotic heart disease of native coronary artery without angina pectoris: Secondary | ICD-10-CM | POA: Diagnosis not present

## 2014-11-11 DIAGNOSIS — E781 Pure hyperglyceridemia: Secondary | ICD-10-CM | POA: Diagnosis not present

## 2014-11-11 DIAGNOSIS — J449 Chronic obstructive pulmonary disease, unspecified: Secondary | ICD-10-CM | POA: Diagnosis not present

## 2014-11-11 DIAGNOSIS — D72821 Monocytosis (symptomatic): Secondary | ICD-10-CM | POA: Diagnosis not present

## 2014-11-21 DIAGNOSIS — I10 Essential (primary) hypertension: Secondary | ICD-10-CM | POA: Diagnosis not present

## 2014-11-21 DIAGNOSIS — N183 Chronic kidney disease, stage 3 (moderate): Secondary | ICD-10-CM | POA: Diagnosis not present

## 2014-11-21 DIAGNOSIS — R809 Proteinuria, unspecified: Secondary | ICD-10-CM | POA: Diagnosis not present

## 2014-11-29 DIAGNOSIS — I10 Essential (primary) hypertension: Secondary | ICD-10-CM | POA: Diagnosis not present

## 2014-11-29 DIAGNOSIS — J441 Chronic obstructive pulmonary disease with (acute) exacerbation: Secondary | ICD-10-CM | POA: Diagnosis not present

## 2014-11-29 DIAGNOSIS — R05 Cough: Secondary | ICD-10-CM | POA: Diagnosis not present

## 2015-01-22 ENCOUNTER — Ambulatory Visit
Admit: 2015-01-22 | Disposition: A | Discharge status: Home / Self Care | Payer: Self-pay | Attending: Nephrology | Admitting: Nephrology

## 2015-01-22 DIAGNOSIS — Z1231 Encounter for screening mammogram for malignant neoplasm of breast: Secondary | ICD-10-CM | POA: Diagnosis not present

## 2015-02-20 DIAGNOSIS — I10 Essential (primary) hypertension: Secondary | ICD-10-CM | POA: Diagnosis not present

## 2015-02-20 DIAGNOSIS — R809 Proteinuria, unspecified: Secondary | ICD-10-CM | POA: Diagnosis not present

## 2015-03-07 DIAGNOSIS — L570 Actinic keratosis: Secondary | ICD-10-CM | POA: Diagnosis not present

## 2015-03-07 DIAGNOSIS — Z85828 Personal history of other malignant neoplasm of skin: Secondary | ICD-10-CM | POA: Diagnosis not present

## 2015-03-07 DIAGNOSIS — L853 Xerosis cutis: Secondary | ICD-10-CM | POA: Diagnosis not present

## 2015-03-12 DIAGNOSIS — J479 Bronchiectasis, uncomplicated: Secondary | ICD-10-CM | POA: Diagnosis not present

## 2015-03-12 DIAGNOSIS — R0902 Hypoxemia: Secondary | ICD-10-CM | POA: Diagnosis not present

## 2015-03-12 DIAGNOSIS — J449 Chronic obstructive pulmonary disease, unspecified: Secondary | ICD-10-CM | POA: Diagnosis not present

## 2015-03-18 ENCOUNTER — Other Ambulatory Visit: Payer: Self-pay

## 2015-03-18 MED ORDER — MECLIZINE HCL 25 MG PO TABS: 25.0000 mg | ORAL_TABLET | Freq: Every day | ORAL | Status: DC | PRN

## 2015-03-18 NOTE — Telephone Encounter (Signed)
We got a refill request from the pharmacy for her Meclizine. I checked PP and it had just been filled on 01/21/15 for 90 pills. I called the patient and she states that she is not out of it yet, but they are getting her enrolled in a "ready fill" program and need a new refill. They will not ship it until she is due for her next refill in July.

## 2015-03-25 DIAGNOSIS — H3531 Nonexudative age-related macular degeneration: Secondary | ICD-10-CM | POA: Diagnosis not present

## 2015-03-31 DIAGNOSIS — I2510 Atherosclerotic heart disease of native coronary artery without angina pectoris: Secondary | ICD-10-CM | POA: Diagnosis not present

## 2015-03-31 DIAGNOSIS — G4733 Obstructive sleep apnea (adult) (pediatric): Secondary | ICD-10-CM | POA: Diagnosis not present

## 2015-03-31 DIAGNOSIS — I340 Nonrheumatic mitral (valve) insufficiency: Secondary | ICD-10-CM | POA: Diagnosis not present

## 2015-03-31 DIAGNOSIS — I10 Essential (primary) hypertension: Secondary | ICD-10-CM | POA: Diagnosis not present

## 2015-03-31 DIAGNOSIS — E785 Hyperlipidemia, unspecified: Secondary | ICD-10-CM | POA: Diagnosis not present

## 2015-03-31 DIAGNOSIS — R0602 Shortness of breath: Secondary | ICD-10-CM | POA: Diagnosis not present

## 2015-04-02 DIAGNOSIS — R079 Chest pain, unspecified: Secondary | ICD-10-CM | POA: Diagnosis not present

## 2015-04-03 IMAGING — CR DG CHEST 2V
1 series · 2 of 2 positions shown · non-contrast
Comparison: CT chest 10/25/2013 and chest radiograph 08/11/2012.

CLINICAL DATA: Productive cough for 2 weeks with associated upper
chest pain and shoulder pain.

EXAM:
CHEST  2 VIEW

[Series 1: pa · 0.17mm/px · 2 of 2 slices shown]
[im 1/2]
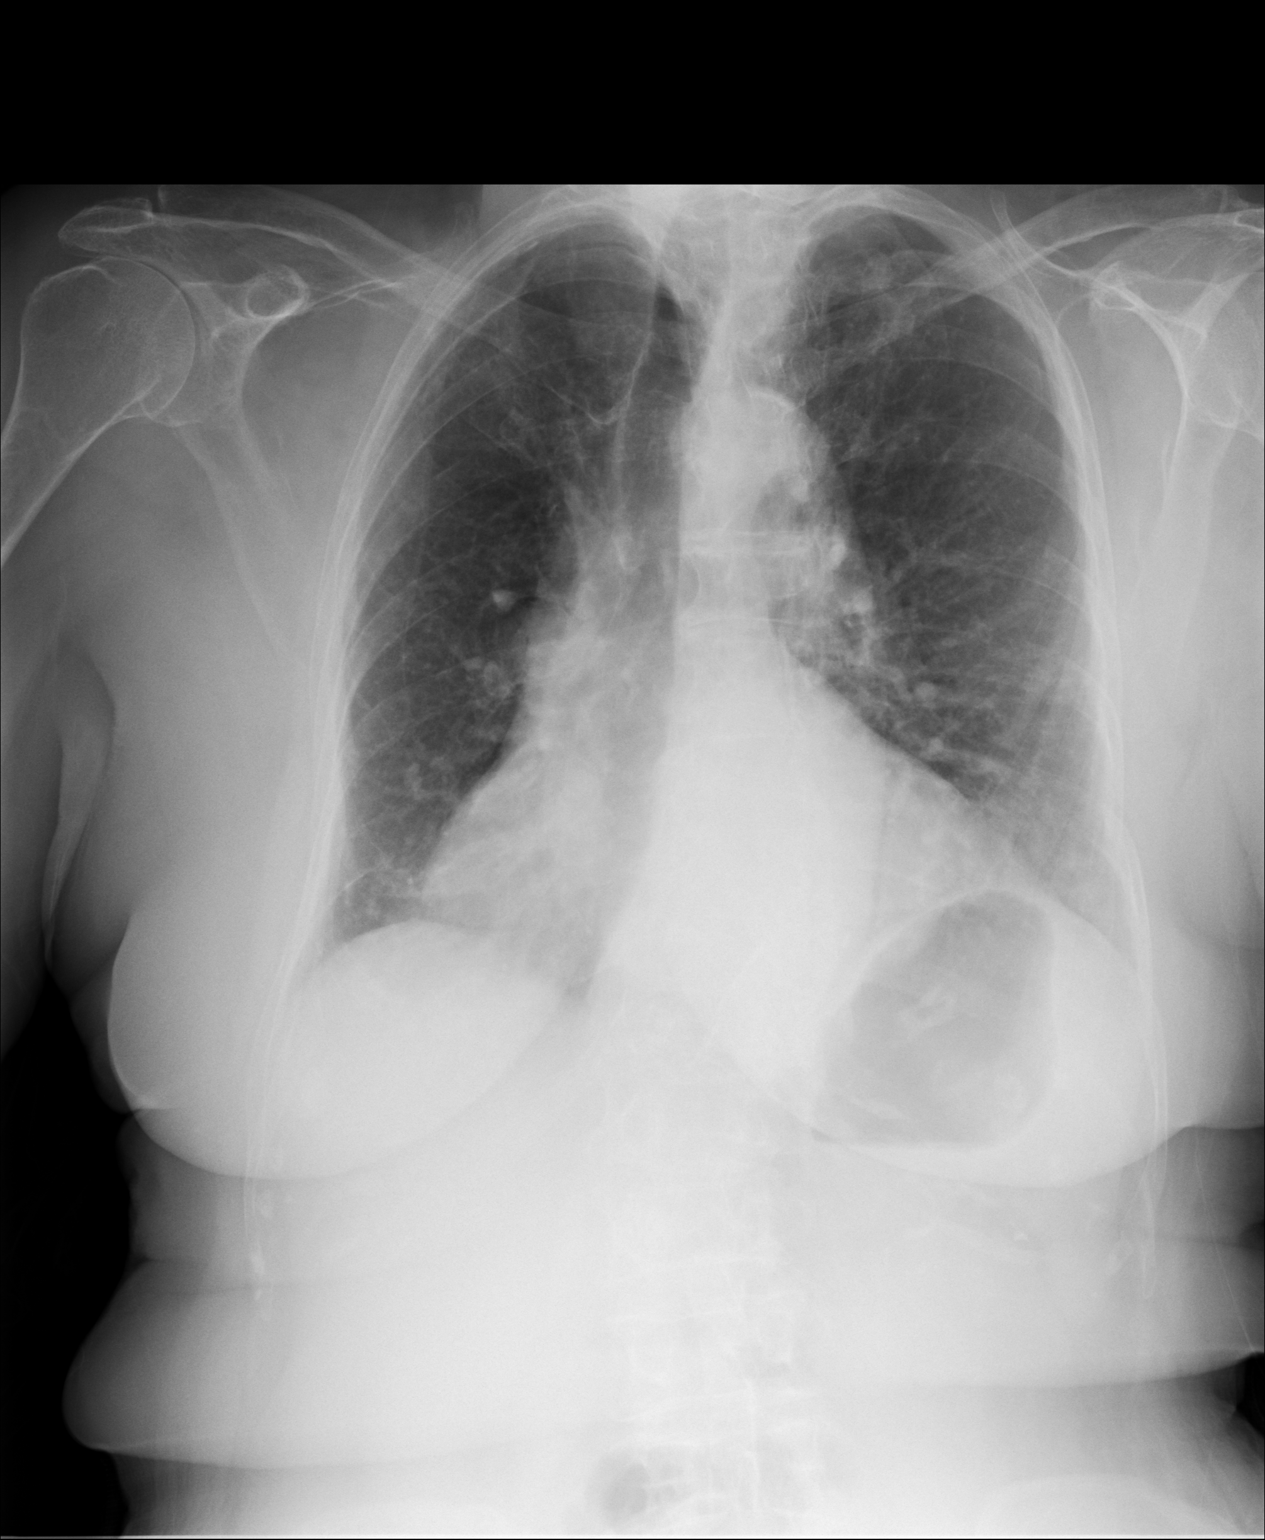
[im 2/2]
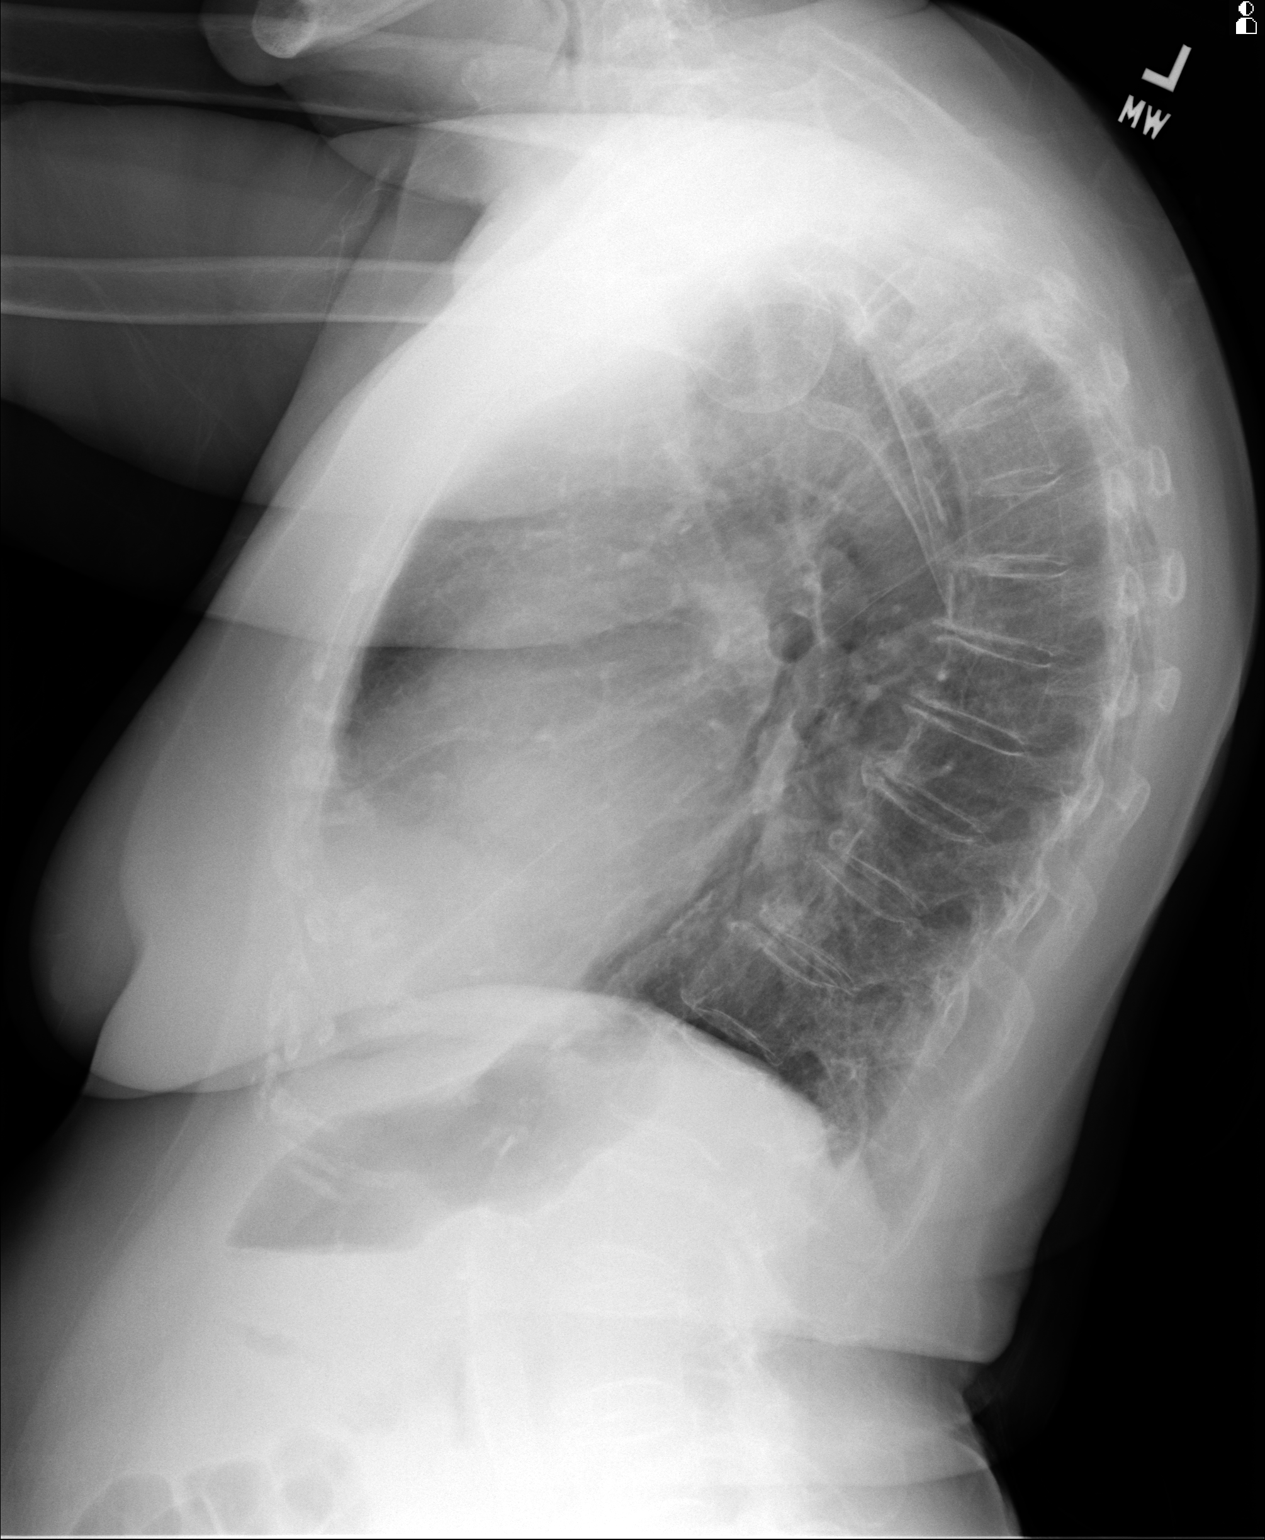

[2 of 2 positions shown; findings below may reference images not displayed]

FINDINGS: Trachea is midline, given patient rotation. Heart is enlarged,
stable. Thoracic aorta is calcified. Lungs are clear.
IMPRESSION: No acute findings.

## 2015-04-07 ENCOUNTER — Other Ambulatory Visit: Payer: Self-pay

## 2015-04-07 ENCOUNTER — Emergency Department
Admission: EM | Admit: 2015-04-07 | Discharge: 2015-04-07 | Disposition: A | Discharge status: Home / Self Care | Payer: Medicare Other | Attending: Emergency Medicine | Admitting: Emergency Medicine

## 2015-04-07 DIAGNOSIS — Z7982 Long term (current) use of aspirin: Secondary | ICD-10-CM | POA: Diagnosis not present

## 2015-04-07 DIAGNOSIS — I10 Essential (primary) hypertension: Secondary | ICD-10-CM | POA: Diagnosis not present

## 2015-04-07 DIAGNOSIS — I479 Paroxysmal tachycardia, unspecified: Secondary | ICD-10-CM | POA: Diagnosis not present

## 2015-04-07 DIAGNOSIS — I499 Cardiac arrhythmia, unspecified: Secondary | ICD-10-CM | POA: Diagnosis not present

## 2015-04-07 DIAGNOSIS — R000 Tachycardia, unspecified: Secondary | ICD-10-CM

## 2015-04-07 DIAGNOSIS — Z79899 Other long term (current) drug therapy: Secondary | ICD-10-CM | POA: Insufficient documentation

## 2015-04-07 HISTORY — DX: Heart failure, unspecified: I50.9

## 2015-04-07 HISTORY — DX: Essential (primary) hypertension: I10

## 2015-04-07 HISTORY — DX: Unspecified osteoarthritis, unspecified site: M19.90

## 2015-04-07 HISTORY — DX: Disorder of kidney and ureter, unspecified: N28.9

## 2015-04-07 LAB — URINALYSIS COMPLETE WITH MICROSCOPIC (ARMC ONLY)
Bacteria, UA: NONE SEEN
Bilirubin Urine: NEGATIVE
GLUCOSE, UA: NEGATIVE mg/dL
Hgb urine dipstick: NEGATIVE
Ketones, ur: NEGATIVE mg/dL
LEUKOCYTES UA: NEGATIVE
Nitrite: NEGATIVE
Protein, ur: 100 mg/dL — AB
RBC / HPF: NONE SEEN RBC/hpf (ref 0–5)
SPECIFIC GRAVITY, URINE: 1.006 (ref 1.005–1.030)
Squamous Epithelial / LPF: NONE SEEN
pH: 8 (ref 5.0–8.0)

## 2015-04-07 LAB — TROPONIN I

## 2015-04-07 LAB — BASIC METABOLIC PANEL
Anion gap: 11 (ref 5–15)
BUN: 26 mg/dL — ABNORMAL HIGH (ref 6–20)
CALCIUM: 9.1 mg/dL (ref 8.9–10.3)
CO2: 25 mmol/L (ref 22–32)
Chloride: 97 mmol/L — ABNORMAL LOW (ref 101–111)
Creatinine, Ser: 0.98 mg/dL (ref 0.44–1.00)
GFR calc Af Amer: 57 mL/min — ABNORMAL LOW (ref >60)
GFR calc non Af Amer: 49 mL/min — ABNORMAL LOW (ref >60)
Glucose, Bld: 128 mg/dL — ABNORMAL HIGH (ref 65–99)
Potassium: 3.9 mmol/L (ref 3.5–5.1)
Sodium: 133 mmol/L — ABNORMAL LOW (ref 135–145)

## 2015-04-07 LAB — CBC
HEMATOCRIT: 35.6 % (ref 35.0–47.0)
HEMOGLOBIN: 12.3 g/dL (ref 12.0–16.0)
MCH: 31 pg (ref 26.0–34.0)
MCHC: 34.6 g/dL (ref 32.0–36.0)
MCV: 89.6 fL (ref 80.0–100.0)
Platelets: 188 10*3/uL (ref 150–440)
RBC: 3.97 MIL/uL (ref 3.80–5.20)
RDW: 12.9 % (ref 11.5–14.5)
WBC: 10 10*3/uL (ref 3.6–11.0)

## 2015-04-07 NOTE — Discharge Instructions (Signed)
See a cardiologist tomorrow morning for the echocardiogram is planned. Return to the emergency department if you have any further episodes of a fast heart rate, if you have chest pain, shortness of breath, or if you have other urgent concerns.  Nonspecific Tachycardia Tachycardia is a faster than normal heartbeat (more than 100 beats per minute). In adults, the heart normally beats between 60 and 100 times a minute. A fast heartbeat may be a normal response to exercise or stress. It does not necessarily mean that something is wrong. However, sometimes when your heart beats too fast it may not be able to pump enough blood to the rest of your body. This can result in chest pain, shortness of breath, dizziness, and even fainting. Nonspecific tachycardia means that the specific cause or pattern of your tachycardia is unknown. CAUSES  Tachycardia may be harmless or it may be due to a more serious underlying cause. Possible causes of tachycardia include:  Exercise or exertion.  Fever.  Pain or injury.  Infection.  Loss of body fluids (dehydration).  Overactive thyroid.  Lack of red blood cells (anemia).  Anxiety and stress.  Alcohol.  Caffeine.  Tobacco products.  Diet pills.  Illegal drugs.  Heart disease. SYMPTOMS  Rapid or irregular heartbeat (palpitations).  Suddenly feeling your heart beating (cardiac awareness).  Dizziness.  Tiredness (fatigue).  Shortness of breath.  Chest pain.  Nausea.  Fainting. DIAGNOSIS  Your caregiver will perform a physical exam and take your medical history. In some cases, a heart specialist (cardiologist) may be consulted. Your caregiver may also order:  Blood tests.  Electrocardiography. This test records the electrical activity of your heart.  A heart monitoring test. TREATMENT  Treatment will depend on the likely cause of your tachycardia. The goal is to treat the underlying cause of your tachycardia. Treatment methods may  include:  Replacement of fluids or blood through an intravenous (IV) tube for moderate to severe dehydration or anemia.  New medicines or changes in your current medicines.  Diet and lifestyle changes.  Treatment for certain infections.  Stress relief or relaxation methods. HOME CARE INSTRUCTIONS   Rest.  Drink enough fluids to keep your urine clear or pale yellow.  Do not smoke.  Avoid:  Caffeine.  Tobacco.  Alcohol.  Chocolate.  Stimulants such as over-the-counter diet pills or pills that help you stay awake.  Situations that cause anxiety or stress.  Illegal drugs such as marijuana, phencyclidine (PCP), and cocaine.  Only take medicine as directed by your caregiver.  Keep all follow-up appointments as directed by your caregiver. SEEK IMMEDIATE MEDICAL CARE IF:   You have pain in your chest, upper arms, jaw, or neck.  You become weak, dizzy, or feel faint.  You have palpitations that will not go away.  You vomit, have diarrhea, or pass blood in your stool.  Your skin is cool, pale, and wet.  You have a fever that will not go away with rest, fluids, and medicine. MAKE SURE YOU:   Understand these instructions.  Will watch your condition.  Will get help right away if you are not doing well or get worse. Document Released: 10/28/2004 Document Revised: 12/13/2011 Document Reviewed: 08/31/2011 [facility] Patient Information 2015 [place], [place]. This information is not intended to replace advice given to you by your health care provider. Make sure you discuss any questions you have with your health care provider.

## 2015-04-07 NOTE — ED Provider Notes (Signed)
[facility] Emergency Department Provider Note  ____________________________________________  Time seen: 41  I have reviewed the triage vital signs and the nursing notes.   HISTORY  Chief Complaint Tachycardia palpitations    HPI Marisa Hicks is a 81 y.o. female with COPD. She has a pulse oximeter meter at home which also measured her pulse. Today when she use that she noted that the heart rate was 150 and above. This occurred this afternoon, approximately 1 to 1-1/2 hours prior to arrival. She took her usual dose of carvedilol and called 911. EMS arrived and found the patient's heart rate had improved. The patient reports that it eased down from 150 into the 140s and then back into the 80s.  The patient reports that her heart rate is usually in the 60s and 70s.  The patient reports she has been feeling a little bit weak lately, which she clarifies this and says she always feels weak. She felt weak this morning in her usual manner with no acute distress.   When the palpitations began, they were mild. The rate was mostly notable through her pulse oximeter meter. She does report she felt the pulse up into her head to some degree. She denies any chest pain or shortness of breath.  She has now improved and feels better but not perfect.   Past Medical History  Diagnosis Date  . Renal disorder   . Hypertension   . Arthritis   . Osteoarthritis   . CHF (congestive heart failure)     There are no active problems to display for this patient.   Past Surgical History  Procedure Laterality Date  . Tubal ligation    . Tonsillectomy    . Right knee surgery      Current Outpatient Rx  Name  Route  Sig  Dispense  Refill  . ALPRAZolam (XANAX) 0.25 MG tablet   Oral   Take 0.25 mg by mouth 2 (two) times daily.         Marland Kitchen aspirin EC 81 MG tablet   Oral   Take 81 mg by mouth daily.         . carvedilol (COREG) 6.25 MG tablet   Oral   Take 6.25 mg by  mouth 2 (two) times daily with a meal.         . meclizine (ANTIVERT) 25 MG tablet   Oral   Take 1 tablet (25 mg total) by mouth daily as needed for dizziness.   90 tablet   1     Allergies Ace inhibitors; Altace; Hydralazine hcl; Labetalol hcl; and Tribenzor  History reviewed. No pertinent family history.  Social History History  Substance Use Topics  . Smoking status: Never Smoker   . Smokeless tobacco: Not on file  . Alcohol Use: No    Review of Systems  Constitutional: Negative for fever. ENT: Negative for sore throat. Cardiovascular: positive for palpitations and tachycardia. See history of present illness. Respiratory: Negative for shortness of breath. Gastrointestinal: Negative for abdominal pain, vomiting and diarrhea. Genitourinary: Negative for dysuria. Musculoskeletal: No myalgias or injuries. Skin: Negative for rash. Neurological: Negative for headaches   10-point ROS otherwise negative.  ____________________________________________   PHYSICAL EXAM:  VITAL SIGNS: ED Triage Vitals  Enc Vitals Group     BP 04/07/15 1913 145/82 mmHg     Pulse Rate 04/07/15 1913 88     Resp 04/07/15 1913 17     Temp --  Temp src --      SpO2 04/07/15 1913 97 %     Weight 04/07/15 1913 129 lb (58.514 kg)     Height 04/07/15 1913 4\' 11"  (1.499 m)     Head Cir --      Peak Flow --      Pain Score --      Pain Loc --      Pain Edu? --      Excl. in [place]? --     Constitutional: Alert and oriented. Well appearing and in no distress. ENT   Head: Normocephalic and atraumatic.   Nose: No congestion/rhinnorhea. Cardiovascular: Normal rate at 86, regular rhythm, there is a mild 1 to 2/6 systolic ejection murmur Respiratory:  Normal respiratory effort, no tachypnea.    Breath sounds are clear and equal bilaterally.  Gastrointestinal: Soft and nontender. No distention.  Back: No muscle spasm, no tenderness, no CVA tenderness. Musculoskeletal: No deformity  noted. Nontender with normal range of motion in all extremities.  No noted edema. Neurologic:  Normal speech and language. No gross focal neurologic deficits are appreciated.  Skin:  Skin is warm, dry. No rash noted. Psychiatric: Mood and affect are normal. Speech and behavior are normal.  ____________________________________________    LABS (pertinent positives/negatives)  Labs Reviewed  BASIC METABOLIC PANEL - Abnormal; Notable for the following:    Sodium 133 (*)    Chloride 97 (*)    Glucose, Bld 128 (*)    BUN 26 (*)    GFR calc non Af Amer 49 (*)    GFR calc Af Amer 57 (*)    All other components within normal limits  URINALYSIS COMPLETEWITH MICROSCOPIC ([facility] ONLY) - Abnormal; Notable for the following:    Color, Urine STRAW (*)    APPearance CLEAR (*)    Protein, ur 100 (*)    All other components within normal limits  CBC  TROPONIN I     ____________________________________________   EKG  ED ECG REPORT I, Dayshaun Whobrey W, the attending physician, personally viewed and interpreted this ECG.   Date: 04/07/2015  EKG Time: 1910  Rate: 88  Rhythm:  sinus rhythm with first-degree block  Axis: Normal  Intervals: PR of 232, QTc 467  ST&T Change: None noted  No comparison available ____________________________________________   ____________________________________________   INITIAL IMPRESSION / ASSESSMENT AND PLAN / ED COURSE  Pertinent labs & imaging results that were available during my care of the patient were reviewed by me and considered in my medical decision making (see chart for details).  On initial evaluation, this pleasant 81 year old female appears to be in no acute distress with normal heart rate and a EKG which shows sinus rhythm with a first-degree block.  ----------------------------------------- 8:54 PM on 04/07/2015 -----------------------------------------  The patient's son and her daughter-in-law are now in the room. On reexamination,  the patient's heart rate is in the 70s. She feels comfortable and is in no acute distress.  Upon further discussion, she reports that she is under ongoing evaluation by Dr. Stark Klein. She had a stress test on Thursday and she has an echocardiogram scheduled for tomorrow morning.   ----------------------------------------- 10:19 PM on 04/07/2015 -----------------------------------------  Urinalysis is negative. We'll discharge patient home. She is a follow-up appointment tomorrow morning with cardiology for an echocardiogram.  ____________________________________________   FINAL CLINICAL IMPRESSION(S) / ED DIAGNOSES  Final diagnoses:  Tachycardia  Paroxysmal tachycardia      Ahmed Prima, MD 04/07/15 2221

## 2015-04-07 NOTE — ED Notes (Signed)
Pt to ED via [facility] from home d/t rapid HR. Pt owns a pulse ox and found her HR to be in the 140's. Pt reports taking 6.25 mg of Carvedilol PO prior to transport. Pt reports feeling better upon arrival to the ED. Pt uses home O2 PRN. Pt is A&O, in NAD, speaking clearly and in complete sentences.

## 2015-04-08 DIAGNOSIS — R002 Palpitations: Secondary | ICD-10-CM | POA: Diagnosis not present

## 2015-04-08 DIAGNOSIS — I2510 Atherosclerotic heart disease of native coronary artery without angina pectoris: Secondary | ICD-10-CM | POA: Diagnosis not present

## 2015-04-08 DIAGNOSIS — G4733 Obstructive sleep apnea (adult) (pediatric): Secondary | ICD-10-CM | POA: Diagnosis not present

## 2015-04-08 DIAGNOSIS — I340 Nonrheumatic mitral (valve) insufficiency: Secondary | ICD-10-CM | POA: Diagnosis not present

## 2015-04-08 DIAGNOSIS — I10 Essential (primary) hypertension: Secondary | ICD-10-CM | POA: Diagnosis not present

## 2015-05-02 DIAGNOSIS — R9430 Abnormal result of cardiovascular function study, unspecified: Secondary | ICD-10-CM | POA: Diagnosis not present

## 2015-05-02 DIAGNOSIS — I2510 Atherosclerotic heart disease of native coronary artery without angina pectoris: Secondary | ICD-10-CM | POA: Diagnosis not present

## 2015-05-05 DIAGNOSIS — E785 Hyperlipidemia, unspecified: Secondary | ICD-10-CM | POA: Diagnosis not present

## 2015-05-05 DIAGNOSIS — I10 Essential (primary) hypertension: Secondary | ICD-10-CM | POA: Diagnosis not present

## 2015-05-05 DIAGNOSIS — I2510 Atherosclerotic heart disease of native coronary artery without angina pectoris: Secondary | ICD-10-CM | POA: Diagnosis not present

## 2015-05-05 DIAGNOSIS — I471 Supraventricular tachycardia: Secondary | ICD-10-CM | POA: Diagnosis not present

## 2015-05-09 ENCOUNTER — Other Ambulatory Visit: Payer: Self-pay | Admitting: Family Medicine

## 2015-05-09 NOTE — Telephone Encounter (Signed)
Routing to provider  

## 2015-05-14 ENCOUNTER — Encounter: Payer: Self-pay | Admitting: Family Medicine

## 2015-05-14 ENCOUNTER — Telehealth: Payer: Self-pay

## 2015-05-14 ENCOUNTER — Ambulatory Visit (INDEPENDENT_AMBULATORY_CARE_PROVIDER_SITE_OTHER): Payer: Medicare Other | Admitting: Family Medicine

## 2015-05-14 VITALS — BP 128/69 | HR 79 | Temp 100.2°F | Wt 128.0 lb

## 2015-05-14 DIAGNOSIS — E785 Hyperlipidemia, unspecified: Secondary | ICD-10-CM | POA: Insufficient documentation

## 2015-05-14 DIAGNOSIS — R0600 Dyspnea, unspecified: Secondary | ICD-10-CM | POA: Insufficient documentation

## 2015-05-14 DIAGNOSIS — J189 Pneumonia, unspecified organism: Secondary | ICD-10-CM | POA: Diagnosis not present

## 2015-05-14 DIAGNOSIS — I517 Cardiomegaly: Secondary | ICD-10-CM | POA: Insufficient documentation

## 2015-05-14 DIAGNOSIS — D72820 Lymphocytosis (symptomatic): Secondary | ICD-10-CM | POA: Insufficient documentation

## 2015-05-14 DIAGNOSIS — M810 Age-related osteoporosis without current pathological fracture: Secondary | ICD-10-CM | POA: Insufficient documentation

## 2015-05-14 DIAGNOSIS — H6123 Impacted cerumen, bilateral: Secondary | ICD-10-CM

## 2015-05-14 DIAGNOSIS — D72821 Monocytosis (symptomatic): Secondary | ICD-10-CM | POA: Insufficient documentation

## 2015-05-14 DIAGNOSIS — R059 Cough, unspecified: Secondary | ICD-10-CM

## 2015-05-14 DIAGNOSIS — R1310 Dysphagia, unspecified: Secondary | ICD-10-CM | POA: Insufficient documentation

## 2015-05-14 DIAGNOSIS — E781 Pure hyperglyceridemia: Secondary | ICD-10-CM | POA: Insufficient documentation

## 2015-05-14 DIAGNOSIS — I10 Essential (primary) hypertension: Secondary | ICD-10-CM | POA: Insufficient documentation

## 2015-05-14 DIAGNOSIS — R05 Cough: Secondary | ICD-10-CM | POA: Diagnosis not present

## 2015-05-14 DIAGNOSIS — I272 Pulmonary hypertension, unspecified: Secondary | ICD-10-CM | POA: Insufficient documentation

## 2015-05-14 DIAGNOSIS — F419 Anxiety disorder, unspecified: Secondary | ICD-10-CM | POA: Insufficient documentation

## 2015-05-14 DIAGNOSIS — R809 Proteinuria, unspecified: Secondary | ICD-10-CM | POA: Insufficient documentation

## 2015-05-14 DIAGNOSIS — T7840XA Allergy, unspecified, initial encounter: Secondary | ICD-10-CM | POA: Insufficient documentation

## 2015-05-14 DIAGNOSIS — I2510 Atherosclerotic heart disease of native coronary artery without angina pectoris: Secondary | ICD-10-CM | POA: Insufficient documentation

## 2015-05-14 DIAGNOSIS — J449 Chronic obstructive pulmonary disease, unspecified: Secondary | ICD-10-CM | POA: Insufficient documentation

## 2015-05-14 DIAGNOSIS — E871 Hypo-osmolality and hyponatremia: Secondary | ICD-10-CM | POA: Insufficient documentation

## 2015-05-14 LAB — CBC WITH DIFFERENTIAL/PLATELET
Hematocrit: 33 % — ABNORMAL LOW (ref 34.0–46.6)
Hemoglobin: 11.1 g/dL (ref 11.1–15.9)
LYMPHS ABS: 3.1 10*3/uL (ref 0.7–3.1)
LYMPHS: 30 %
MCH: 31.2 pg (ref 26.6–33.0)
MCHC: 33.6 g/dL (ref 31.5–35.7)
MCV: 93 fL (ref 79–97)
MID (Absolute): 1.4 10*3/uL (ref 0.1–1.6)
MID: 14 %
Neutrophils Absolute: 5.9 10*3/uL (ref 1.4–7.0)
Neutrophils: 56 %
Platelets: 186 10*3/uL (ref 150–379)
RBC: 3.56 x10E6/uL — ABNORMAL LOW (ref 3.77–5.28)
RDW: 13 % (ref 12.3–15.4)
WBC: 10.4 10*3/uL (ref 3.4–10.8)

## 2015-05-14 MED ORDER — DOXYCYCLINE HYCLATE 100 MG PO TABS: 100.0000 mg | ORAL_TABLET | Freq: Two times a day (BID) | ORAL | Status: DC

## 2015-05-14 NOTE — Telephone Encounter (Signed)
Yes, please do work her in, okay to double book at 1:00 pm today

## 2015-05-14 NOTE — Assessment & Plan Note (Signed)
As for pneumonia; she has underlying COPD, suspect this is bacterial; appreciate assistance of pulmonary colleague, Dr. Raul Del

## 2015-05-14 NOTE — Telephone Encounter (Signed)
She called with complaints of a cold and cough x 3 days. States she is coughing up yellow stuff. No fever. We have no openings today but wants to know if you could call her in something or work her in. She is worried that the coughing will cause her heart palpitations to be worse.

## 2015-05-14 NOTE — Progress Notes (Signed)
BP 128/69 mmHg  Pulse 79  Temp(Src) 100.2 F (37.9 C)  Wt 128 lb (58.06 kg)  SpO2 95%   Subjective:    Patient ID: Marisa Hicks, female    DOB: May 18, 1922, 81 y.o.   MRN: [id number]  HPI: Marisa Hicks is a 81 y.o. female  Chief Complaint  Patient presents with  . URI    x 3 days   She started to get sick on Sunday Getting worse over the last 2-3 days Her daughter had come home from visiting family who were sick and she thinks she picked it up from her She is coughing, rattling, coughing up phlegm, yellow in color Now up in her sinuses, all stopped up in her head She doesn't feel feverish; no shaking chills Having shortness of breath, not new; sees pulmonologist No body aches Allergic to HCTZ, multiple other drugs; list reviewed  Sees Dr. Humphrey Rolls for new heart problem; heart speeds up really fast, was in the ER with a heart rate of 140 in July; went up at home to 160 bpm and rescue took her to the ER; had another spell of heart rate of 140 bpm on Monday I reviewed her EKG; PR interval 232, QTc 467 from [facility] ER records; I was not able to access any of Dr. Laurelyn Sickle records through Epic  Relevant past medical, surgical, family and social history reviewed and updated as indicated. Interim medical history since our last visit reviewed. Allergies and medications reviewed and updated.  Review of Systems Per HPI unless specifically indicated above     Objective:    BP 128/69 mmHg  Pulse 79  Temp(Src) 100.2 F (37.9 C)  Wt 128 lb (58.06 kg)  SpO2 95%  Wt Readings from Last 3 Encounters:  05/14/15 128 lb (58.06 kg)  04/07/15 129 lb (58.514 kg)    Physical Exam  Constitutional: She appears well-developed and well-nourished. No distress.  Appears rather tired, elderly, but nontoxic  HENT:  Head: Normocephalic and atraumatic.  Right Ear: Tympanic membrane is not injected, not erythematous, not retracted and not bulging. A middle ear effusion (scant) is present.  Left Ear:  Tympanic membrane is not injected, not erythematous, not retracted and not bulging.  Nose: Septal deviation present.  Mouth/Throat: Mucous membranes are not pale and dry (with slightly scabbing of lower lip). No posterior oropharyngeal edema or posterior oropharyngeal erythema.  Slight irritation and hyperemia along left side septum (septum deviates to left), suggestive of irritation from nasal cannula; no frank bleeding or purulenct Bilateral cerumen; removed in toto with curette by MD from left and right canals, patient tolerated well  Eyes: EOM are normal. No scleral icterus.  Neck: No thyromegaly present.  Cardiovascular: Normal rate, regular rhythm and normal heart sounds.   No murmur heard. Pulmonary/Chest: Effort normal. No accessory muscle usage. No tachypnea. No respiratory distress (no distress, no tachypnea, rate about 20). She has decreased breath sounds in the right middle field and the left upper field. She has no wheezes. She has no rhonchi. She has no rales.  Abdominal: Soft. Bowel sounds are normal. She exhibits no distension.  Musculoskeletal: Normal range of motion. She exhibits no edema.  Lymphadenopathy:    She has no cervical adenopathy.       Right: No supraclavicular adenopathy present.       Left: No supraclavicular adenopathy present.  Neurological: She is alert. She exhibits normal muscle tone.  Skin: Skin is warm and dry. No ecchymosis and no rash  noted. She is not diaphoretic. No pallor.  Psychiatric: She has a normal mood and affect. Her behavior is normal. Judgment and thought content normal.      Assessment & Plan:   Problem List Items Addressed This Visit      Respiratory   Pneumonia, organism unspecified    Suspect pneumonia clinically; she would not agree to getting a chest xray; CBC done here in house, WBC only 10.4 without left shift (normal diff); I called her pulmonologist, as I am concerned about using levaquin or azithro plus ceph given her recent  cardiac events; he agrees with doxy, Rx for 100 mg BID x 10 days with f/u CXR; patient was instructed to rest, stay hydrated, take vitamin C; pneumonia can be dangerous, so go to the hospital if getting worse, don't let this get out of hand, etc.; she agrees; she has oxygen; will see her back in 2 days for f/u; I am the doctor on-call this week and she was encouraged to call me if any needs or concerns      Relevant Medications   fexofenadine (ALLEGRA) 180 MG tablet   Umeclidinium-Vilanterol (ANORO ELLIPTA) 62.5-25 MCG/INH AEPB   mometasone (NASONEX) 50 MCG/ACT nasal spray   doxycycline (VIBRA-TABS) 100 MG tablet     Other   Cough - Primary    As for pneumonia; she has underlying COPD, suspect this is bacterial; appreciate assistance of pulmonary colleague, Dr. Raul Del      Relevant Orders   CBC With Differential/Platelet    Other Visit Diagnoses    Impacted cerumen, bilateral        both ear drums cleaned of impacted cerumen manually by MD with curette under direct visualization       Follow up plan: Return in about 2 days (around 05/16/2015) for pneumonia.  An after-visit summary was printed and given to the patient at [place].  Please see the patient instructions which may contain other information and recommendations beyond what is mentioned above in the assessment and plan.

## 2015-05-14 NOTE — Telephone Encounter (Signed)
Patient came in for appointment.  

## 2015-05-14 NOTE — Patient Instructions (Addendum)
Try vitamin C (orange juice if not diabetic or vitamin C tablets) and drink green tea to help your immune system during your illness Get plenty of rest and hydration Start the antibiotic right away and continue every 12 hours for 10 days Return in 2 days for follow-up If you get worse, please do go to the hospital; there are resistant bugs and infections can become overwhelming if they get out of hand, so don't wait too long if you are getting worse If you need to talk to me, call the main number here at [facility] after hours and the nurse can have me page [phone]

## 2015-05-14 NOTE — Assessment & Plan Note (Signed)
Suspect pneumonia clinically; she would not agree to getting a chest xray; CBC done here in house, WBC only 10.4 without left shift (normal diff); I called her pulmonologist, as I am concerned about using levaquin or azithro plus ceph given her recent cardiac events; he agrees with doxy, Rx for 100 mg BID x 10 days with f/u CXR; patient was instructed to rest, stay hydrated, take vitamin C; pneumonia can be dangerous, so go to the hospital if getting worse, don't let this get out of hand, etc.; she agrees; she has oxygen; will see her back in 2 days for f/u; I am the doctor on-call this week and she was encouraged to call me if any needs or concerns

## 2015-05-15 ENCOUNTER — Ambulatory Visit
Admission: RE | Admit: 2015-05-15 | Discharge: 2015-05-15 | Disposition: A | Discharge status: Home / Self Care | Payer: Medicare Other | Source: Ambulatory Visit | Attending: Family Medicine | Admitting: Family Medicine

## 2015-05-15 ENCOUNTER — Telehealth: Payer: Self-pay

## 2015-05-15 DIAGNOSIS — J42 Unspecified chronic bronchitis: Secondary | ICD-10-CM

## 2015-05-15 DIAGNOSIS — J189 Pneumonia, unspecified organism: Secondary | ICD-10-CM

## 2015-05-15 DIAGNOSIS — I517 Cardiomegaly: Secondary | ICD-10-CM | POA: Insufficient documentation

## 2015-05-15 DIAGNOSIS — R05 Cough: Secondary | ICD-10-CM | POA: Diagnosis not present

## 2015-05-15 NOTE — Telephone Encounter (Signed)
She states that she couldn't go for the chest xray yesterday because she had no one to take her. She has someone that can drive her today and wants to know if you'd like for her to still get it done?

## 2015-05-15 NOTE — Telephone Encounter (Signed)
Yes, yes, please do have her go I entered "future" order for chest xray, expected today for [employer] location

## 2015-05-15 NOTE — Assessment & Plan Note (Signed)
Pt able to go for CXR today; order entered

## 2015-05-15 NOTE — Telephone Encounter (Signed)
Patent notified !

## 2015-05-16 ENCOUNTER — Encounter: Payer: Self-pay | Admitting: Family Medicine

## 2015-05-16 ENCOUNTER — Ambulatory Visit (INDEPENDENT_AMBULATORY_CARE_PROVIDER_SITE_OTHER): Payer: Medicare Other | Admitting: Family Medicine

## 2015-05-16 VITALS — BP 150/73 | HR 84 | Temp 98.1°F | Wt 129.2 lb

## 2015-05-16 DIAGNOSIS — J209 Acute bronchitis, unspecified: Secondary | ICD-10-CM

## 2015-05-16 DIAGNOSIS — R059 Cough, unspecified: Secondary | ICD-10-CM

## 2015-05-16 DIAGNOSIS — R05 Cough: Secondary | ICD-10-CM | POA: Diagnosis not present

## 2015-05-16 DIAGNOSIS — J441 Chronic obstructive pulmonary disease with (acute) exacerbation: Secondary | ICD-10-CM

## 2015-05-16 MED ORDER — PREDNISONE 10 MG PO TABS: ORAL_TABLET | ORAL | Status: DC

## 2015-05-16 MED ORDER — ALBUTEROL SULFATE (2.5 MG/3ML) 0.083% IN NEBU: 2.5000 mg | INHALATION_SOLUTION | RESPIRATORY_TRACT | Status: DC | PRN

## 2015-05-16 MED ORDER — HYDROCOD POLST-CPM POLST ER 10-8 MG/5ML PO SUER: 5.0000 mL | Freq: Two times a day (BID) | ORAL | Status: DC | PRN

## 2015-05-16 NOTE — Progress Notes (Signed)
BP 150/73 mmHg  Pulse 84  Temp(Src) 98.1 F (36.7 C)  Wt 129 lb 3.2 oz (58.605 kg)  SpO2 96%   Subjective:    Patient ID: Marisa Hicks, female    DOB: 1922-09-21, 81 y.o.   MRN: [id number]  HPI: Marisa Hicks is a 81 y.o. female  Chief Complaint  Patient presents with  . Pneumonia   She was seen two days ago Had CXR and it did not show pneumonia Coughs an awful lot though, bringing up yellow stuff Tossed and turned the other night, just coughed and coughed She last used her rescue inhaler about 6 hours ago; only has the hand-held inhaler; does not have a nebulizer She went by the drugstore and forgot to get mucinex Using rescue inhaler 2-3 x a day  She says that Dr. Humphrey Rolls found something on her spleen and wants her primary to take over; I have not received any note about this and cannot access any imaging through Epic to support this  Relevant past medical, surgical, family and social history reviewed and updated as indicated. Interim medical history since our last visit reviewed. Allergies and medications reviewed and updated.  Review of Systems   Per HPI unless specifically indicated above     Objective:    BP 150/73 mmHg  Pulse 84  Temp(Src) 98.1 F (36.7 C)  Wt 129 lb 3.2 oz (58.605 kg)  SpO2 96%  Wt Readings from Last 3 Encounters:  05/16/15 129 lb 3.2 oz (58.605 kg)  05/14/15 128 lb (58.06 kg)  04/07/15 129 lb (58.514 kg)    Physical Exam  Constitutional: She appears well-developed and well-nourished. No distress.  HENT:  Head: Normocephalic and atraumatic.  Eyes: EOM are normal. No scleral icterus.  Cardiovascular: Normal rate, regular rhythm and normal heart sounds.   No murmur heard. Pulmonary/Chest: Effort normal. No respiratory distress. She has wheezes. She has no rales.  Diffuse wheezing throughout; nebulizer treatment given (albuterol) with a little more wheezing and better airflow appreciated after the treatment  Abdominal: Soft. She  exhibits no distension.  Musculoskeletal: Normal range of motion. She exhibits no edema.  Lymphadenopathy:    She has no cervical adenopathy.  Neurological: She is alert. She exhibits normal muscle tone.  Skin: Skin is warm and dry. She is not diaphoretic. No pallor.  Psychiatric: She has a normal mood and affect. Her behavior is normal. Judgment and thought content normal.    Results for orders placed or performed in visit on 05/14/15  CBC With Differential/Platelet  Result Value Ref Range   WBC 10.4 3.4 - 10.8 x10E3/uL   RBC 3.56 (L) 3.77 - 5.28 x10E6/uL   Hemoglobin 11.1 11.1 - 15.9 g/dL   Hematocrit 33.0 (L) 34.0 - 46.6 %   MCV 93 79 - 97 fL   MCH 31.2 26.6 - 33.0 pg   MCHC 33.6 31.5 - 35.7 g/dL   RDW 13.0 12.3 - 15.4 %   Platelets 186 150 - 379 x10E3/uL   Neutrophils 56 %   Lymphs 30 %   MID 14 %   Neutrophils Absolute 5.9 1.4 - 7.0 x10E3/uL   Lymphocytes Absolute 3.1 0.7 - 3.1 x10E3/uL   MID (Absolute) 1.4 0.1 - 1.6 X10E3/uL      Assessment & Plan:   Problem List Items Addressed This Visit      Respiratory   COPD exacerbation - Primary   Relevant Medications   predniSONE (DELTASONE) 10 MG tablet   albuterol (PROVENTIL) (  2.5 MG/3ML) 0.083% nebulizer solution   chlorpheniramine-HYDROcodone (TUSSIONEX PENNKINETIC ER) 10-8 MG/5ML SUER   Other Relevant Orders   DME Nebulizer machine   Bronchitis, acute, with bronchospasm     Other   Cough    She has used tussionex in the past; briefly discussed, do not mix with alcohol         Follow up plan: Return if symptoms worsen or fail to improve.  Orders Placed This Encounter  Procedures  . DME Nebulizer machine   Meds ordered this encounter  Medications  . predniSONE (DELTASONE) 10 MG tablet    Sig: Five pills by mouth today, then 4 on Sat, 3 on Sun, 2 on Monday, and 1 on Tuesday    Dispense:  15 tablet    Refill:  0  . albuterol (PROVENTIL) (2.5 MG/3ML) 0.083% nebulizer solution    Sig: Take 3 mLs (2.5 mg  total) by nebulization every 4 (four) hours as needed for wheezing or shortness of breath. Do not use with your rescue inhaler; just one or the other    Dispense:  50 mL    Refill:  1  . chlorpheniramine-HYDROcodone (TUSSIONEX PENNKINETIC ER) 10-8 MG/5ML SUER    Sig: Take 5 mLs by mouth every 12 (twelve) hours as needed for cough.    Dispense:  115 mL    Refill:  0

## 2015-05-16 NOTE — Assessment & Plan Note (Signed)
She has used tussionex in the past; briefly discussed, do not mix with alcohol

## 2015-05-16 NOTE — Patient Instructions (Addendum)
Continue your doxycycline Start prednisone taper today; take with food If you develop any abdominal pain or blood in your stool or dark stools, go immediately to the emergency department Try vitamin C (orange juice if not diabetic or vitamin C tablets) and drink green tea to help your immune system during your illness Get plenty of rest and hydration Use the albuterol nebulizer if you feel tight, have wheezing, or increased cough; do NOT use the albuterol nebulizer PLUS the albuterol inhaler; just use one or the other Call over the weekend if needed [phone] If you get worse over the weekend, go to the emergency department

## 2015-05-18 ENCOUNTER — Encounter: Payer: Self-pay | Admitting: Emergency Medicine

## 2015-05-18 ENCOUNTER — Telehealth: Payer: Self-pay | Admitting: Family Medicine

## 2015-05-18 ENCOUNTER — Emergency Department: Payer: Medicare Other

## 2015-05-18 ENCOUNTER — Inpatient Hospital Stay
Admission: EM | Admit: 2015-05-18 | Discharge: 2015-05-23 | DRG: 189 | Disposition: A | Discharge status: Home / Self Care | Payer: Medicare Other | Attending: Internal Medicine | Admitting: Internal Medicine

## 2015-05-18 DIAGNOSIS — I5032 Chronic diastolic (congestive) heart failure: Secondary | ICD-10-CM | POA: Diagnosis not present

## 2015-05-18 DIAGNOSIS — Z9981 Dependence on supplemental oxygen: Secondary | ICD-10-CM

## 2015-05-18 DIAGNOSIS — Z9049 Acquired absence of other specified parts of digestive tract: Secondary | ICD-10-CM | POA: Diagnosis present

## 2015-05-18 DIAGNOSIS — J42 Unspecified chronic bronchitis: Secondary | ICD-10-CM

## 2015-05-18 DIAGNOSIS — J9620 Acute and chronic respiratory failure, unspecified whether with hypoxia or hypercapnia: Principal | ICD-10-CM | POA: Diagnosis present

## 2015-05-18 DIAGNOSIS — Z823 Family history of stroke: Secondary | ICD-10-CM

## 2015-05-18 DIAGNOSIS — J40 Bronchitis, not specified as acute or chronic: Secondary | ICD-10-CM | POA: Diagnosis not present

## 2015-05-18 DIAGNOSIS — Z79899 Other long term (current) drug therapy: Secondary | ICD-10-CM

## 2015-05-18 DIAGNOSIS — Z825 Family history of asthma and other chronic lower respiratory diseases: Secondary | ICD-10-CM

## 2015-05-18 DIAGNOSIS — J441 Chronic obstructive pulmonary disease with (acute) exacerbation: Secondary | ICD-10-CM | POA: Diagnosis present

## 2015-05-18 DIAGNOSIS — J449 Chronic obstructive pulmonary disease, unspecified: Secondary | ICD-10-CM | POA: Diagnosis not present

## 2015-05-18 DIAGNOSIS — I10 Essential (primary) hypertension: Secondary | ICD-10-CM | POA: Diagnosis present

## 2015-05-18 DIAGNOSIS — Z888 Allergy status to other drugs, medicaments and biological substances status: Secondary | ICD-10-CM

## 2015-05-18 DIAGNOSIS — J479 Bronchiectasis, uncomplicated: Secondary | ICD-10-CM | POA: Diagnosis present

## 2015-05-18 DIAGNOSIS — Z7951 Long term (current) use of inhaled steroids: Secondary | ICD-10-CM

## 2015-05-18 DIAGNOSIS — E785 Hyperlipidemia, unspecified: Secondary | ICD-10-CM | POA: Diagnosis not present

## 2015-05-18 DIAGNOSIS — Z9851 Tubal ligation status: Secondary | ICD-10-CM

## 2015-05-18 DIAGNOSIS — I2510 Atherosclerotic heart disease of native coronary artery without angina pectoris: Secondary | ICD-10-CM | POA: Diagnosis present

## 2015-05-18 DIAGNOSIS — Z809 Family history of malignant neoplasm, unspecified: Secondary | ICD-10-CM

## 2015-05-18 DIAGNOSIS — M1990 Unspecified osteoarthritis, unspecified site: Secondary | ICD-10-CM | POA: Diagnosis present

## 2015-05-18 DIAGNOSIS — R0602 Shortness of breath: Secondary | ICD-10-CM | POA: Diagnosis not present

## 2015-05-18 DIAGNOSIS — R062 Wheezing: Secondary | ICD-10-CM

## 2015-05-18 DIAGNOSIS — Z955 Presence of coronary angioplasty implant and graft: Secondary | ICD-10-CM

## 2015-05-18 DIAGNOSIS — G4733 Obstructive sleep apnea (adult) (pediatric): Secondary | ICD-10-CM | POA: Diagnosis present

## 2015-05-18 DIAGNOSIS — I471 Supraventricular tachycardia: Secondary | ICD-10-CM | POA: Diagnosis present

## 2015-05-18 DIAGNOSIS — Z8249 Family history of ischemic heart disease and other diseases of the circulatory system: Secondary | ICD-10-CM

## 2015-05-18 DIAGNOSIS — Z9889 Other specified postprocedural states: Secondary | ICD-10-CM

## 2015-05-18 LAB — CBC
HEMATOCRIT: 34.4 % — AB (ref 35.0–47.0)
Hemoglobin: 11.5 g/dL — ABNORMAL LOW (ref 12.0–16.0)
MCH: 30.4 pg (ref 26.0–34.0)
MCHC: 33.5 g/dL (ref 32.0–36.0)
MCV: 90.9 fL (ref 80.0–100.0)
Platelets: 268 10*3/uL (ref 150–440)
RBC: 3.78 MIL/uL — AB (ref 3.80–5.20)
RDW: 12.9 % (ref 11.5–14.5)
WBC: 17.8 10*3/uL — AB (ref 3.6–11.0)

## 2015-05-18 LAB — BASIC METABOLIC PANEL
Anion gap: 10 (ref 5–15)
BUN: 38 mg/dL — ABNORMAL HIGH (ref 6–20)
CALCIUM: 9.2 mg/dL (ref 8.9–10.3)
CO2: 23 mmol/L (ref 22–32)
Chloride: 104 mmol/L (ref 101–111)
Creatinine, Ser: 1.12 mg/dL — ABNORMAL HIGH (ref 0.44–1.00)
GFR calc Af Amer: 48 mL/min — ABNORMAL LOW (ref >60)
GFR calc non Af Amer: 42 mL/min — ABNORMAL LOW (ref >60)
Glucose, Bld: 180 mg/dL — ABNORMAL HIGH (ref 65–99)
POTASSIUM: 4.4 mmol/L (ref 3.5–5.1)
SODIUM: 137 mmol/L (ref 135–145)

## 2015-05-18 LAB — TROPONIN I: Troponin I: <0.03 ng/mL (ref <0.031)

## 2015-05-18 NOTE — ED Notes (Signed)
Pt. States difficulty breathing for 1 week.  Pt. States she is prn 3L O2 at home.  Pt. States using more this week.  Pt. States hx of COPD and bronchitis.

## 2015-05-18 NOTE — Telephone Encounter (Signed)
On-call triage nurse contacted me Patient short of breath; was told to go to ER I called patient, she had a few questions to ask me; asked if okay to wait to go tomorrow morning She sounded quite SHOB, I instructed her to go now for likely admission, do not wait until morning, and I'll give report to [facility] ER I gave report to [place] in ER, explained CXR, doxy, steroids, albuterol, failing outpatient treatment Dr. Raul Del is her pulmonologist

## 2015-05-18 NOTE — ED Notes (Signed)
Patient has been diagnosed with bronchitis and has been seen by her pcp twice this week. Patient with increase in shortness of breath that started today. Patient with a history of copd.

## 2015-05-19 ENCOUNTER — Inpatient Hospital Stay
Admit: 2015-05-19 | Discharge: 2015-05-19 | Disposition: A | Discharge status: Home / Self Care | Payer: Medicare Other | Attending: Cardiovascular Disease | Admitting: Cardiovascular Disease

## 2015-05-19 ENCOUNTER — Encounter: Payer: Self-pay | Admitting: Nurse Practitioner

## 2015-05-19 DIAGNOSIS — Z79899 Other long term (current) drug therapy: Secondary | ICD-10-CM | POA: Diagnosis not present

## 2015-05-19 DIAGNOSIS — J441 Chronic obstructive pulmonary disease with (acute) exacerbation: Secondary | ICD-10-CM | POA: Diagnosis present

## 2015-05-19 DIAGNOSIS — Z809 Family history of malignant neoplasm, unspecified: Secondary | ICD-10-CM | POA: Diagnosis not present

## 2015-05-19 DIAGNOSIS — E785 Hyperlipidemia, unspecified: Secondary | ICD-10-CM | POA: Diagnosis present

## 2015-05-19 DIAGNOSIS — G4733 Obstructive sleep apnea (adult) (pediatric): Secondary | ICD-10-CM | POA: Diagnosis present

## 2015-05-19 DIAGNOSIS — M1990 Unspecified osteoarthritis, unspecified site: Secondary | ICD-10-CM | POA: Diagnosis present

## 2015-05-19 DIAGNOSIS — J9620 Acute and chronic respiratory failure, unspecified whether with hypoxia or hypercapnia: Secondary | ICD-10-CM | POA: Diagnosis present

## 2015-05-19 DIAGNOSIS — J479 Bronchiectasis, uncomplicated: Secondary | ICD-10-CM | POA: Diagnosis present

## 2015-05-19 DIAGNOSIS — Z9981 Dependence on supplemental oxygen: Secondary | ICD-10-CM | POA: Diagnosis not present

## 2015-05-19 DIAGNOSIS — I471 Supraventricular tachycardia: Secondary | ICD-10-CM | POA: Diagnosis present

## 2015-05-19 DIAGNOSIS — Z9851 Tubal ligation status: Secondary | ICD-10-CM | POA: Diagnosis not present

## 2015-05-19 DIAGNOSIS — J449 Chronic obstructive pulmonary disease, unspecified: Secondary | ICD-10-CM | POA: Diagnosis not present

## 2015-05-19 DIAGNOSIS — Z7951 Long term (current) use of inhaled steroids: Secondary | ICD-10-CM | POA: Diagnosis not present

## 2015-05-19 DIAGNOSIS — J9621 Acute and chronic respiratory failure with hypoxia: Secondary | ICD-10-CM | POA: Diagnosis not present

## 2015-05-19 DIAGNOSIS — Z955 Presence of coronary angioplasty implant and graft: Secondary | ICD-10-CM | POA: Diagnosis not present

## 2015-05-19 DIAGNOSIS — Z9889 Other specified postprocedural states: Secondary | ICD-10-CM | POA: Diagnosis not present

## 2015-05-19 DIAGNOSIS — I2510 Atherosclerotic heart disease of native coronary artery without angina pectoris: Secondary | ICD-10-CM | POA: Diagnosis present

## 2015-05-19 DIAGNOSIS — J40 Bronchitis, not specified as acute or chronic: Secondary | ICD-10-CM | POA: Diagnosis not present

## 2015-05-19 DIAGNOSIS — I499 Cardiac arrhythmia, unspecified: Secondary | ICD-10-CM | POA: Diagnosis not present

## 2015-05-19 DIAGNOSIS — Z8249 Family history of ischemic heart disease and other diseases of the circulatory system: Secondary | ICD-10-CM | POA: Diagnosis not present

## 2015-05-19 DIAGNOSIS — Z888 Allergy status to other drugs, medicaments and biological substances status: Secondary | ICD-10-CM | POA: Diagnosis not present

## 2015-05-19 DIAGNOSIS — Z825 Family history of asthma and other chronic lower respiratory diseases: Secondary | ICD-10-CM | POA: Diagnosis not present

## 2015-05-19 DIAGNOSIS — I5032 Chronic diastolic (congestive) heart failure: Secondary | ICD-10-CM | POA: Diagnosis present

## 2015-05-19 DIAGNOSIS — Z823 Family history of stroke: Secondary | ICD-10-CM | POA: Diagnosis not present

## 2015-05-19 DIAGNOSIS — R0902 Hypoxemia: Secondary | ICD-10-CM | POA: Diagnosis not present

## 2015-05-19 DIAGNOSIS — I10 Essential (primary) hypertension: Secondary | ICD-10-CM | POA: Diagnosis present

## 2015-05-19 DIAGNOSIS — Z9049 Acquired absence of other specified parts of digestive tract: Secondary | ICD-10-CM | POA: Diagnosis present

## 2015-05-19 LAB — HEMOGLOBIN A1C: Hgb A1c MFr Bld: 6 % (ref 4.0–6.0)

## 2015-05-19 LAB — TSH: TSH: 1.09 u[IU]/mL (ref 0.350–4.500)

## 2015-05-19 MED ORDER — METHYLPREDNISOLONE SODIUM SUCC 125 MG IJ SOLR
60.0000 mg | Freq: Four times a day (QID) | INTRAMUSCULAR | Status: DC
  Administered 2015-05-19 – 2015-05-21 (×8): 60 mg via INTRAVENOUS
  Administered 2015-05-21: 10:00:00 via INTRAVENOUS
  Administered 2015-05-21 – 2015-05-22 (×3): 60 mg via INTRAVENOUS
  Filled 2015-05-19 (×12): qty 2

## 2015-05-19 MED ORDER — AMLODIPINE BESYLATE 10 MG PO TABS
10.0000 mg | ORAL_TABLET | Freq: Every day | ORAL | Status: DC
  Administered 2015-05-19 – 2015-05-23 (×5): 10 mg via ORAL
  Filled 2015-05-19 (×5): qty 1

## 2015-05-19 MED ORDER — ADULT MULTIVITAMIN W/MINERALS CH
1.0000 | ORAL_TABLET | Freq: Every day | ORAL | Status: DC
  Administered 2015-05-19 – 2015-05-23 (×5): 1 via ORAL
  Filled 2015-05-19 (×9): qty 1

## 2015-05-19 MED ORDER — PREDNISONE 10 MG PO TABS: 10.0000 mg | ORAL_TABLET | Freq: Every day | ORAL | Status: DC

## 2015-05-19 MED ORDER — PREDNISONE 20 MG PO TABS: 20.0000 mg | ORAL_TABLET | Freq: Every day | ORAL | Status: DC

## 2015-05-19 MED ORDER — RALOXIFENE HCL 60 MG PO TABS
60.0000 mg | ORAL_TABLET | Freq: Every day | ORAL | Status: DC
  Administered 2015-05-19 – 2015-05-23 (×5): 60 mg via ORAL
  Filled 2015-05-19 (×5): qty 1

## 2015-05-19 MED ORDER — ALPRAZOLAM 0.25 MG PO TABS
0.2500 mg | ORAL_TABLET | Freq: Two times a day (BID) | ORAL | Status: DC
  Administered 2015-05-19 – 2015-05-23 (×9): 0.25 mg via ORAL
  Filled 2015-05-19 (×9): qty 1

## 2015-05-19 MED ORDER — CARVEDILOL 12.5 MG PO TABS
12.5000 mg | ORAL_TABLET | Freq: Two times a day (BID) | ORAL | Status: DC
  Administered 2015-05-19 (×2): 12.5 mg via ORAL
  Filled 2015-05-19 (×2): qty 1

## 2015-05-19 MED ORDER — PREDNISONE 50 MG PO TABS
50.0000 mg | ORAL_TABLET | Freq: Every day | ORAL | Status: AC
  Administered 2015-05-19: 50 mg via ORAL
  Filled 2015-05-19: qty 1

## 2015-05-19 MED ORDER — HYDROCOD POLST-CPM POLST ER 10-8 MG/5ML PO SUER: 5.0000 mL | Freq: Two times a day (BID) | ORAL | Status: DC | PRN

## 2015-05-19 MED ORDER — AZITHROMYCIN 250 MG PO TABS
500.0000 mg | ORAL_TABLET | Freq: Every day | ORAL | Status: DC
  Administered 2015-05-20: 500 mg via ORAL
  Filled 2015-05-19: qty 2

## 2015-05-19 MED ORDER — ADENOSINE 6 MG/2ML IV SOLN
6.0000 mg | INTRAVENOUS | Status: AC
  Filled 2015-05-19: qty 2

## 2015-05-19 MED ORDER — HEPARIN SODIUM (PORCINE) 5000 UNIT/ML IJ SOLN
5000.0000 [IU] | Freq: Three times a day (TID) | INTRAMUSCULAR | Status: DC
  Administered 2015-05-19 – 2015-05-22 (×11): 5000 [IU] via SUBCUTANEOUS
  Filled 2015-05-19 (×12): qty 1

## 2015-05-19 MED ORDER — PREDNISONE 20 MG PO TABS: 40.0000 mg | ORAL_TABLET | Freq: Every day | ORAL | Status: DC

## 2015-05-19 MED ORDER — LORATADINE 10 MG PO TABS
10.0000 mg | ORAL_TABLET | Freq: Every day | ORAL | Status: DC
  Administered 2015-05-19 – 2015-05-23 (×5): 10 mg via ORAL
  Filled 2015-05-19 (×5): qty 1

## 2015-05-19 MED ORDER — ACETAMINOPHEN 325 MG PO TABS: 650.0000 mg | ORAL_TABLET | Freq: Four times a day (QID) | ORAL | Status: DC | PRN

## 2015-05-19 MED ORDER — UMECLIDINIUM-VILANTEROL 62.5-25 MCG/INH IN AEPB
1.0000 | INHALATION_SPRAY | Freq: Every day | RESPIRATORY_TRACT | Status: DC
  Administered 2015-05-20 – 2015-05-23 (×4): 1 via RESPIRATORY_TRACT
  Filled 2015-05-19: qty 60

## 2015-05-19 MED ORDER — MAGNESIUM SULFATE 2 GM/50ML IV SOLN
2.0000 g | Freq: Once | INTRAVENOUS | Status: AC
Start: 2015-05-19 — End: 2015-05-19
  Administered 2015-05-19: 2 g via INTRAVENOUS
  Filled 2015-05-19: qty 50

## 2015-05-19 MED ORDER — FLUTICASONE PROPIONATE 50 MCG/ACT NA SUSP
1.0000 | Freq: Every day | NASAL | Status: DC
  Administered 2015-05-19 – 2015-05-23 (×5): 1 via NASAL
  Filled 2015-05-19: qty 16

## 2015-05-19 MED ORDER — ASPIRIN EC 81 MG PO TBEC
81.0000 mg | DELAYED_RELEASE_TABLET | Freq: Every day | ORAL | Status: DC
  Administered 2015-05-19 – 2015-05-23 (×5): 81 mg via ORAL
  Filled 2015-05-19 (×5): qty 1

## 2015-05-19 MED ORDER — METHYLPREDNISOLONE SODIUM SUCC 125 MG IJ SOLR
125.0000 mg | Freq: Once | INTRAMUSCULAR | Status: AC
  Administered 2015-05-19: 125 mg via INTRAVENOUS
  Filled 2015-05-19: qty 2

## 2015-05-19 MED ORDER — CLOPIDOGREL BISULFATE 75 MG PO TABS
75.0000 mg | ORAL_TABLET | Freq: Every day | ORAL | Status: DC
  Administered 2015-05-19 – 2015-05-23 (×4): 75 mg via ORAL
  Filled 2015-05-19 (×5): qty 1

## 2015-05-19 MED ORDER — ALBUTEROL SULFATE (2.5 MG/3ML) 0.083% IN NEBU
2.5000 mg | INHALATION_SOLUTION | Freq: Four times a day (QID) | RESPIRATORY_TRACT | Status: DC
  Administered 2015-05-19 – 2015-05-23 (×14): 2.5 mg via RESPIRATORY_TRACT
  Filled 2015-05-19 (×13): qty 3

## 2015-05-19 MED ORDER — CALCIUM CARBONATE-VITAMIN D 500-200 MG-UNIT PO TABS
1.0000 | ORAL_TABLET | Freq: Two times a day (BID) | ORAL | Status: DC
  Administered 2015-05-19 – 2015-05-23 (×9): 1 via ORAL
  Filled 2015-05-19 (×9): qty 1

## 2015-05-19 MED ORDER — SODIUM CHLORIDE 0.9 % IV SOLN
INTRAVENOUS | Status: DC
  Administered 2015-05-19 – 2015-05-20 (×2): via INTRAVENOUS
  Administered 2015-05-21: 75 mL/h via INTRAVENOUS
  Administered 2015-05-21 – 2015-05-22 (×2): via INTRAVENOUS

## 2015-05-19 MED ORDER — RISAQUAD PO CAPS
1.0000 | ORAL_CAPSULE | Freq: Every day | ORAL | Status: DC
  Administered 2015-05-19 – 2015-05-23 (×5): 1 via ORAL
  Filled 2015-05-19 (×5): qty 1

## 2015-05-19 MED ORDER — IPRATROPIUM-ALBUTEROL 0.5-2.5 (3) MG/3ML IN SOLN
3.0000 mL | Freq: Once | RESPIRATORY_TRACT | Status: AC
  Administered 2015-05-19: 3 mL via RESPIRATORY_TRACT
  Filled 2015-05-19: qty 3

## 2015-05-19 MED ORDER — PREDNISONE 20 MG PO TABS: 30.0000 mg | ORAL_TABLET | Freq: Every day | ORAL | Status: DC

## 2015-05-19 MED ORDER — MORPHINE SULFATE 2 MG/ML IJ SOLN: 1.0000 mg | INTRAMUSCULAR | Status: DC | PRN

## 2015-05-19 MED ORDER — ACETAMINOPHEN 650 MG RE SUPP: 650.0000 mg | Freq: Four times a day (QID) | RECTAL | Status: DC | PRN

## 2015-05-19 MED ORDER — VITAMIN D 1000 UNITS PO TABS
2000.0000 [IU] | ORAL_TABLET | Freq: Every day | ORAL | Status: DC
  Administered 2015-05-19 – 2015-05-23 (×5): 2000 [IU] via ORAL
  Filled 2015-05-19 (×5): qty 2

## 2015-05-19 MED ORDER — ALBUTEROL SULFATE (2.5 MG/3ML) 0.083% IN NEBU
2.5000 mg | INHALATION_SOLUTION | RESPIRATORY_TRACT | Status: DC | PRN
  Administered 2015-05-19 (×2): 2.5 mg via RESPIRATORY_TRACT
  Filled 2015-05-19 (×3): qty 3

## 2015-05-19 MED ORDER — PREDNISONE 2.5 MG PO TABS: 5.0000 mg | ORAL_TABLET | Freq: Every day | ORAL | Status: DC

## 2015-05-19 MED ORDER — OMEGA-3-ACID ETHYL ESTERS 1 G PO CAPS
1.0000 g | ORAL_CAPSULE | Freq: Every day | ORAL | Status: DC
  Administered 2015-05-19 – 2015-05-23 (×5): 1 g via ORAL
  Filled 2015-05-19 (×5): qty 1

## 2015-05-19 MED ORDER — AZITHROMYCIN 500 MG IV SOLR
500.0000 mg | Freq: Once | INTRAVENOUS | Status: AC
  Administered 2015-05-19: 500 mg via INTRAVENOUS
  Filled 2015-05-19: qty 500

## 2015-05-19 MED ORDER — DOCUSATE SODIUM 100 MG PO CAPS
100.0000 mg | ORAL_CAPSULE | Freq: Two times a day (BID) | ORAL | Status: DC
  Administered 2015-05-19 – 2015-05-23 (×9): 100 mg via ORAL
  Filled 2015-05-19 (×9): qty 1

## 2015-05-19 NOTE — ED Notes (Signed)
Pt. Finished duoneb and was given solumedrol, pt. States some improvement, but still feels tight in chest.

## 2015-05-19 NOTE — ED Notes (Signed)
Helped pt. Use bathroom.  Pt. Uses cane.

## 2015-05-19 NOTE — Progress Notes (Signed)
Date: 05/19/2015,   MRN# [id number] FINLAY MILLS 09/29/1922 Code Status:     Code Status Orders        Start     Ordered   05/19/15 0624  Full code   Continuous     05/19/15 0623     Hosp day:@LENGTHOFSTAYDAYS @ Referring MD: @ATDPROV @     PCP:      AdmissionWeight: 129 lb (58.514 kg)                 CurrentWeight: 133 lb 1.6 oz (60.374 kg)   CC: Copd exacerbation  HPI: This is a 81 year old lady, here with sob, cough, wheezing, low sats, (on home o2 ) fever earlier, clear chest xray. Being treated for copd exacerbation. Asked to see. Presently not having chest pain, hemoptysis, calf pain, ectopy or syncope.     PMHX:   Past Medical History  Diagnosis Date  . Renal disorder   . Arthritis   . Osteoarthritis   . CHF (congestive heart failure)   . Dyspnea   . CAD (coronary artery disease)   . COPD (chronic obstructive pulmonary disease)   . Hypertension   . Hyperlipidemia   . Anxiety   . Allergy   . Osteoporosis   . Proteinuria   . Lymphocytosis   . Dysphagia   . Monocytosis   . Hyponatremia   . Pulmonary hypertension   . Left ventricular hypertrophy   . Hypertriglyceridemia    Surgical Hx:  Past Surgical History  Procedure Laterality Date  . Tubal ligation    . Tonsillectomy    . Right knee surgery    . Appendectomy     Family Hx:  Family History  Problem Relation Age of Onset  . Hypertension Father   . Emphysema Father   . Cancer Sister     "female" then stomach  . COPD Sister   . Stroke Brother   . COPD Daughter   . Heart disease Paternal Grandfather   . COPD Maternal Grandfather     esophageal   Social Hx:   Social History  Substance Use Topics  . Smoking status: Never Smoker   . Smokeless tobacco: Never Used  . Alcohol Use: No   Medication:    Home Medication:  No current outpatient prescriptions on file.  Current Medication: @CURMEDTAB @   Allergies:  Ace inhibitors; Aliskiren-hydrochlorothiazide; Altace; Atorvastatin; Hydralazine  hcl; Hydrochlorothiazide; Irbesartan-hydrochlorothiazide; Labetalol hcl; Rosuvastatin; and Tribenzor  Review of Systems: Gen:  Denies  fever, sweats, chills HEENT: Denies blurred vision, double vision, ear pain, eye pain, hearing loss, nose bleeds, sore throat Cvc:  No dizziness, chest pain or heaviness Resp:  Sob, no pleurisy, hemoptysis, there is cough and wheezing  Gi: Denies swallowing difficulty, stomach pain, nausea or vomiting, diarrhea, constipation, bowel incontinence Gu:  Denies bladder incontinence, burning urine Ext:   No Joint pain, stiffness or swelling Skin: No skin rash, easy bruising or bleeding or hives Endoc:  No polyuria, polydipsia , polyphagia or weight change Psych: No depression, insomnia or hallucinations  Other:  All other systems negative  Physical Examination:   VS: BP 142/68 mmHg  Pulse 79  Temp(Src) 98.8 F (37.1 C) (Oral)  Resp 18  Ht 4\' 11"  (1.499 m)  Wt 133 lb 1.6 oz (60.374 kg)  BMI 26.87 kg/m2  SpO2 99%  General Appearance: No distress  Neurophych without focal findings, mental status, speech normal, alert and oriented, cranial nerves 2-12 intact, reflexes normal and symmetric, sensation grossly normal  HEENT: PERRLA, EOM intact, no ptosis, no other lesions noticed, Mallampati: Pulmonary:.rare  wheezing, No rales  :   Cardiovascular:  Normal S1,S2.  No m/r/g.  Abdominal aorta pulsation normal.    Abdomen:Benign, Soft, non-tender, No masses, hepatosplenomegaly, No lymphadenopathy Endoc: No evident thyromegaly, no signs of acromegaly or Cushing features Skin:   warm, no rashes, no ecchymosis  Extremities: normal, no cyanosis, clubbing, no edema, warm with normal capillary refill. Other findings:   Labs results:   Recent Labs     05/18/15  2216  HGB  11.5*  HCT  34.4*  MCV  90.9  WBC  17.8*  BUN  38*  CREATININE  1.12*  GLUCOSE  180*  CALCIUM  9.2  ,    Rad results:   Dg Chest 2 View  05/18/2015   CLINICAL DATA:  Shortness of  breath  EXAM: CHEST  2 VIEW  COMPARISON:  05/15/2015  FINDINGS: Chronic hyperinflation in this patient with COPD. Chronic cardiomegaly and aortic tortuosity which is stable. Extensive coronary atherosclerotic calcification is noted. There is no edema, consolidation, effusion, or pneumothorax. No acute osseous findings.  IMPRESSION: 1. Stable exam.  No evidence of acute cardiopulmonary disease. 2. COPD and chronic cardiomegaly.   Electronically Signed   By: Monte Fantasia M.D.   On: 05/18/2015 23:07      Assessment and Plan: Copd (moderate) here with exacerbation. Fever earlier,  no pneumonia, ? Viral Elevated WBC, 2/2 steroids, ? Infection Obstructive sleep apnea (adult) tolerating her her cpap well   Bronchiectasis    Oxygen desaturation secondary to the above. On 0xygen, (inogen system) at home DOE secondary to the above as well as her weight  Plan: -resume anoro, albuterol -solumedrol 50 mg tid -emperic antibiotics -reasonable for her to bring her cpap here -dvt prophylaxis -reassess in am     I have personally obtained a history, examined the patient, evaluated laboratory and imaging results, formulated the assessment and plan and placed orders.  The Patient requires high complexity decision making for assessment and support, frequent evaluation and titration of therapies, application of advanced monitoring technologies and extensive interpretation of multiple databases.   Totiana Everson,M.D. Pulmonary & Critical care Medicine [facility]

## 2015-05-19 NOTE — H&P (Signed)
Marisa Hicks is an 81 y.o. female.   Chief Complaint: Shortness of breath HPI: Patient presents emergency department complaining of cough and shortness of breath. She states that this is been getting progressively worse over the last 5 days. She had seen her primary care doctor who prescribed an biotics and steroids but she has not improved. After starting antibiotics she developed a fever to a temperature of 101F. She feels more tired than usual with very little activity and admits to some nausea but denies vomiting. She states that she is able to produce sputum approximately one time per day but is annoyed that she is unable to clear her chest. She also admits that coughing hurts her side but she denies chest pain. Urgency department she was found to have a continuous supplemental oxygen need (uses oxygen only intermittently at home) which prompted the emergency department to call for admission.  Past Medical History  Diagnosis Date  . Renal disorder   . Arthritis   . Osteoarthritis   . CHF (congestive heart failure)   . Dyspnea   . CAD (coronary artery disease)   . COPD (chronic obstructive pulmonary disease)   . Hypertension   . Hyperlipidemia   . Anxiety   . Allergy   . Osteoporosis   . Proteinuria   . Lymphocytosis   . Dysphagia   . Monocytosis   . Hyponatremia   . Pulmonary hypertension   . Left ventricular hypertrophy   . Hypertriglyceridemia     Past Surgical History  Procedure Laterality Date  . Tubal ligation    . Tonsillectomy    . Right knee surgery    . Appendectomy      Family History  Problem Relation Age of Onset  . Hypertension Father   . Emphysema Father   . Cancer Sister     "female" then stomach  . COPD Sister   . Stroke Brother   . COPD Daughter   . Heart disease Paternal Grandfather   . COPD Maternal Grandfather     esophageal   Social History:  reports that she has never smoked. She has never used smokeless tobacco. She reports that she does  not drink alcohol or use illicit drugs.  Allergies:  Allergies  Allergen Reactions  . Ace Inhibitors   . Aliskiren-Hydrochlorothiazide Other (See Comments)  . Altace [Ramipril] Other (See Comments)    angioedema  . Atorvastatin Other (See Comments)  . Hydralazine Hcl   . Hydrochlorothiazide Other (See Comments)    hyponatremia  . Irbesartan-Hydrochlorothiazide Other (See Comments)  . Labetalol Hcl   . Rosuvastatin Other (See Comments)  . Tribenzor [Olmesartan-Amlodipine-Hctz] Other (See Comments)    Low sodium    Prior to Admission medications   Medication Sig Start Date End Date Taking? Authorizing Provider  acidophilus (RISAQUAD) CAPS capsule Take 1 capsule by mouth daily.   Yes Historical Provider, MD  albuterol (PROVENTIL) (2.5 MG/3ML) 0.083% nebulizer solution Take 3 mLs (2.5 mg total) by nebulization every 4 (four) hours as needed for wheezing or shortness of breath. Do not use with your rescue inhaler; just one or the other 05/16/15  Yes Arnetha Courser, MD  ALPRAZolam Duanne Moron) 0.25 MG tablet Take 0.25 mg by mouth 2 (two) times daily.   Yes Historical Provider, MD  amLODipine (NORVASC) 10 MG tablet Take 10 mg by mouth daily.   Yes Historical Provider, MD  aspirin EC 81 MG tablet Take 81 mg by mouth daily.   Yes Historical Provider, MD  calcium-vitamin D (OSCAL WITH D) 500-200 MG-UNIT per tablet Take 1 tablet by mouth 2 (two) times daily.   Yes Historical Provider, MD  carvedilol (COREG) 12.5 MG tablet Take 12.5 mg by mouth 2 (two) times daily.   Yes Historical Provider, MD  chlorpheniramine-HYDROcodone (TUSSIONEX PENNKINETIC ER) 10-8 MG/5ML SUER Take 5 mLs by mouth every 12 (twelve) hours as needed for cough. 05/16/15  Yes Arnetha Courser, MD  cholecalciferol (VITAMIN D) 1000 UNITS tablet Take 2,000 Units by mouth daily.    Yes Historical Provider, MD  clopidogrel (PLAVIX) 75 MG tablet Take 75 mg by mouth daily.   Yes Historical Provider, MD  doxycycline (VIBRA-TABS) 100 MG tablet  Take 1 tablet (100 mg total) by mouth 2 (two) times daily. 05/14/15  Yes Arnetha Courser, MD  fexofenadine (ALLEGRA) 180 MG tablet Take 180 mg by mouth daily.   Yes Historical Provider, MD  mometasone (NASONEX) 50 MCG/ACT nasal spray Place 2 sprays into the nose daily.   Yes Historical Provider, MD  Multiple Vitamins-Minerals (MULTIVITAMIN WITH MINERALS) tablet Take 1 tablet by mouth daily.   Yes Historical Provider, MD  olmesartan (BENICAR) 40 MG tablet Take 40 mg by mouth daily.   Yes Historical Provider, MD  Omega-3 Fatty Acids (FISH OIL) 1000 MG CAPS Take by mouth daily.    Yes Historical Provider, MD  raloxifene (EVISTA) 60 MG tablet Take 60 mg by mouth daily.   Yes Historical Provider, MD  simvastatin (ZOCOR) 40 MG tablet Take 40 mg by mouth daily.   Yes Historical Provider, MD  Umeclidinium-Vilanterol (ANORO ELLIPTA) 62.5-25 MCG/INH AEPB Inhale 1 puff into the lungs daily.   Yes Historical Provider, MD  meclizine (ANTIVERT) 25 MG tablet Take 1 tablet (25 mg total) by mouth daily as needed for dizziness. Patient not taking: Reported on 05/19/2015 03/18/15   Arnetha Courser, MD  predniSONE (DELTASONE) 10 MG tablet Five pills by mouth today, then 4 on Sat, 3 on Sun, 2 on Monday, and 1 on Tuesday Patient not taking: Reported on 05/19/2015 05/16/15   Arnetha Courser, MD  PROVENTIL HFA 108 (90 BASE) MCG/ACT inhaler INHALE 2 PUFFS AS DIRECTED Patient not taking: Reported on 05/19/2015 05/09/15   Valerie Roys, DO     Results for orders placed or performed during the hospital encounter of 05/18/15 (from the past 48 hour(s))  Basic metabolic panel     Status: Abnormal   Collection Time: 05/18/15 10:16 PM  Result Value Ref Range   Sodium 137 135 - 145 mmol/L   Potassium 4.4 3.5 - 5.1 mmol/L   Chloride 104 101 - 111 mmol/L   CO2 23 22 - 32 mmol/L   Glucose, Bld 180 (H) 65 - 99 mg/dL   BUN 38 (H) 6 - 20 mg/dL   Creatinine, Ser 1.12 (H) 0.44 - 1.00 mg/dL   Calcium 9.2 8.9 - 10.3 mg/dL   GFR calc non  Af Amer 42 (L) >60 mL/min   GFR calc Af Amer 48 (L) >60 mL/min    Comment: (NOTE) The eGFR has been calculated using the CKD EPI equation. This calculation has not been validated in all clinical situations. eGFR's persistently <60 mL/min signify possible Chronic Kidney Disease.    Anion gap 10 5 - 15  CBC     Status: Abnormal   Collection Time: 05/18/15 10:16 PM  Result Value Ref Range   WBC 17.8 (H) 3.6 - 11.0 K/uL   RBC 3.78 (L) 3.80 - 5.20 MIL/uL  Hemoglobin 11.5 (L) 12.0 - 16.0 g/dL   HCT 34.4 (L) 35.0 - 47.0 %   MCV 90.9 80.0 - 100.0 fL   MCH 30.4 26.0 - 34.0 pg   MCHC 33.5 32.0 - 36.0 g/dL   RDW 12.9 11.5 - 14.5 %   Platelets 268 150 - 440 K/uL  Troponin I     Status: None   Collection Time: 05/18/15 10:16 PM  Result Value Ref Range   Troponin I <0.03 <0.031 ng/mL    Comment:        NO INDICATION OF MYOCARDIAL INJURY.    Dg Chest 2 View  05/18/2015   CLINICAL DATA:  Shortness of breath  EXAM: CHEST  2 VIEW  COMPARISON:  05/15/2015  FINDINGS: Chronic hyperinflation in this patient with COPD. Chronic cardiomegaly and aortic tortuosity which is stable. Extensive coronary atherosclerotic calcification is noted. There is no edema, consolidation, effusion, or pneumothorax. No acute osseous findings.  IMPRESSION: 1. Stable exam.  No evidence of acute cardiopulmonary disease. 2. COPD and chronic cardiomegaly.   Electronically Signed   By: Monte Fantasia M.D.   On: 05/18/2015 23:07    Review of Systems  Constitutional: Negative for fever and chills.  HENT: Negative for sore throat and tinnitus.   Eyes: Negative for blurred vision and redness.  Respiratory: Positive for cough and shortness of breath.   Cardiovascular: Negative for chest pain, palpitations, orthopnea and PND.  Gastrointestinal: Negative for nausea, vomiting, abdominal pain and diarrhea.  Genitourinary: Negative for dysuria, urgency and frequency.  Musculoskeletal: Negative for myalgias and joint pain.  Skin:  Negative for rash.       No lesions  Neurological: Negative for speech change, focal weakness and weakness.  Endo/Heme/Allergies: Does not bruise/bleed easily.       No temperature intolerance  Psychiatric/Behavioral: Negative for depression and suicidal ideas.    Blood pressure 164/59, pulse 74, temperature 98.1 F (36.7 C), temperature source Oral, resp. rate 24, height $RemoveBe'4\' 11"'FFkTsunNc$  (1.499 m), weight 58.514 kg (129 lb), SpO2 98 %. Physical Exam  Nursing note and vitals reviewed. Constitutional: She is oriented to person, place, and time. She appears well-developed and well-nourished.  HENT:  Head: Normocephalic and atraumatic.  Mouth/Throat: Oropharynx is clear and moist.  Eyes: Conjunctivae and EOM are normal. Pupils are equal, round, and reactive to light.  Neck: Normal range of motion. Neck supple. No JVD present. No tracheal deviation present. No thyromegaly present.  Cardiovascular: Normal rate, regular rhythm and normal heart sounds.  Exam reveals no gallop and no friction rub.   No murmur heard. Respiratory: Effort normal. She has wheezes (Bilaterally).  GI: Soft. Bowel sounds are normal. She exhibits no distension. There is no tenderness.  Genitourinary:  Deferred  Lymphadenopathy:    She has no cervical adenopathy.  Neurological: She is alert and oriented to person, place, and time. No cranial nerve deficit. She exhibits normal muscle tone.  Skin: Skin is warm and dry. No rash noted. No erythema.  Psychiatric: She has a normal mood and affect. Her behavior is normal. Judgment and thought content normal.     Assessment/Plan 81 year old Caucasian female admitted for acute on chronic respiratory failure. 1. Acute on chronic respiratory failure: Secondary to COPD. The patient is being given methylprednisone in the emergency department and I will continue his oral steroid taper. We will also continue IV antibiotics and breathing treatments as necessary. Continue inhaled  corticosteroid 2. Hypertension: continue carvedilol, amlodipine and ARB 3. CAD: Stable, continue Plavix and  aspirin 4. Congestive heart failure: Diastolic, chronic. Stable 5. DVT prophylaxis: Heparin 6. GI prophylaxis: None The patient is a full code. Time spent on admission orders and patient care approximately 35 minutes  Harrie Foreman 05/19/2015, 6:11 AM

## 2015-05-19 NOTE — Progress Notes (Signed)
    Patient is established with Dr. Humphrey Rolls, MD since 2004. Consult should go to him. Reviewed patient's telemetry she is in NSR in the 80's upon me attempting to see her and stable. Notified 2C to change consult and page Dr. Humphrey Rolls, MD. They agreed to do so.    Christell Faith, PA-C 05/19/2015 3:00 PM

## 2015-05-19 NOTE — Progress Notes (Signed)
[place] [facility] NAME: Marisa Hicks    MR#:  [id number]  DATE OF BIRTH:  1921-10-31  SUBJECTIVE:  CHIEF COMPLAINT:   Chief Complaint  Patient presents with  . Shortness of Breath  struggling at times, mainly on exertion.  Daughter at bedside, having trouble expectorating. REVIEW OF SYSTEMS:  Review of Systems  Constitutional: Negative for fever, weight loss, malaise/fatigue and diaphoresis.  HENT: Negative for ear discharge, ear pain, hearing loss, nosebleeds, sore throat and tinnitus.   Eyes: Negative for blurred vision and pain.  Respiratory: Positive for cough, sputum production, shortness of breath and wheezing. Negative for hemoptysis.   Cardiovascular: Negative for chest pain, palpitations, orthopnea and leg swelling.  Gastrointestinal: Negative for heartburn, nausea, vomiting, abdominal pain, diarrhea, constipation and blood in stool.  Genitourinary: Negative for dysuria, urgency and frequency.  Musculoskeletal: Negative for myalgias and back pain.  Skin: Negative for itching and rash.  Neurological: Negative for dizziness, tingling, tremors, focal weakness, seizures, weakness and headaches.  Psychiatric/Behavioral: Negative for depression. The patient is not nervous/anxious.    DRUG ALLERGIES:   Allergies  Allergen Reactions  . Ace Inhibitors   . Aliskiren-Hydrochlorothiazide Other (See Comments)  . Altace [Ramipril] Other (See Comments)    angioedema  . Atorvastatin Other (See Comments)  . Hydralazine Hcl   . Hydrochlorothiazide Other (See Comments)    hyponatremia  . Irbesartan-Hydrochlorothiazide Other (See Comments)  . Labetalol Hcl   . Rosuvastatin Other (See Comments)  . Tribenzor [Olmesartan-Amlodipine-Hctz] Other (See Comments)    Low sodium   VITALS:  Blood pressure 142/68, pulse 79, temperature 98.8 F (37.1 C), temperature source Oral, resp. rate 18, height 4\' 11"  (1.499 m), weight 60.374 kg (133 lb  1.6 oz), SpO2 99 %. PHYSICAL EXAMINATION:  Physical Exam  Constitutional: She is oriented to person, place, and time and well-developed, well-nourished, and in no distress.  HENT:  Head: Normocephalic and atraumatic.  Eyes: Conjunctivae and EOM are normal. Pupils are equal, round, and reactive to light.  Neck: Normal range of motion. Neck supple. No tracheal deviation present. No thyromegaly present.  Cardiovascular: Normal rate, regular rhythm and normal heart sounds.   Pulmonary/Chest: She is in respiratory distress. She has wheezes. She has rales. She exhibits no tenderness.  Abdominal: Soft. Bowel sounds are normal. She exhibits no distension. There is no tenderness.  Musculoskeletal: Normal range of motion.  Neurological: She is alert and oriented to person, place, and time. No cranial nerve deficit.  Skin: Skin is warm and dry. No rash noted.  Psychiatric: Mood and affect normal.   LABORATORY PANEL:   CBC  Recent Labs Lab 05/18/15 2216  WBC 17.8*  HGB 11.5*  HCT 34.4*  PLT 268   ------------------------------------------------------------------------------------------------------------------ Chemistries   Recent Labs Lab 05/18/15 2216  NA 137  K 4.4  CL 104  CO2 23  GLUCOSE 180*  BUN 38*  CREATININE 1.12*  CALCIUM 9.2   RADIOLOGY:  Dg Chest 2 View  05/18/2015   CLINICAL DATA:  Shortness of breath  EXAM: CHEST  2 VIEW  COMPARISON:  05/15/2015  FINDINGS: Chronic hyperinflation in this patient with COPD. Chronic cardiomegaly and aortic tortuosity which is stable. Extensive coronary atherosclerotic calcification is noted. There is no edema, consolidation, effusion, or pneumothorax. No acute osseous findings.  IMPRESSION: 1. Stable exam.  No evidence of acute cardiopulmonary disease. 2. COPD and chronic cardiomegaly.   Electronically Signed   By: Neva Seat.D.  On: 05/18/2015 23:07   ASSESSMENT AND PLAN:  81 year old Caucasian female admitted for acute on  chronic respiratory failure.  1. Acute on chronic respiratory failure: Secondary to COPD: Change her to IV steroids as she is actively wheezing.  Continue IV antibiotics and breathing treatments as necessary. Continue inhaled corticosteroid.  Pulmo rehabilitation after discharge 2. Hypertension: continue carvedilol, amlodipine and ARB 3. CAD: Stable, continue Plavix and aspirin 4. Congestive heart failure: Diastolic, chronic. Stable 5.  Supraventricular tachycardia: Confirmed on EKG, consult cardiology. It was transient and has been resolved now 6.DVT prophylaxis: Heparin 7. GI prophylaxis: None   All the records are reviewed and case discussed with Care Management/Social Worker. Management plans discussed with the patient, family and they are in agreement.  CODE STATUS: Full code  TOTAL TIME TAKING CARE OF THIS PATIENT: 35 minutes.   More than 50% of the time was spent in counseling/coordination of care: YES  POSSIBLE D/C IN 2-3 DAYS, DEPENDING ON CLINICAL CONDITION.   [facility], Silus Lanzo M.D on 05/19/2015 at 4:16 PM  Between 7am to 6pm - Pager - [phone]  After 6pm go to [email or web address] - password [email or web address] [place] [facility]  Office  [phone]  CC: Primary care physician; Enid Derry, MD

## 2015-05-19 NOTE — Progress Notes (Signed)
Primary Nurse was notified by telemetry of Pt in SVT HR 156. Dr. Manuella Ghazi notified notified and orders received for stat EKG.

## 2015-05-19 NOTE — ED Provider Notes (Signed)
[facility] Emergency Department Provider Note  ____________________________________________  Time seen: Approximately 0038 AM  I have reviewed the triage vital signs and the nursing notes.   HISTORY  Chief Complaint Shortness of Breath    HPI Marisa TORGESON is a 81 y.o. female who has bronchitis. The patient reports she's been having symptoms of cough and wheezing and shortness of breath for the past week. The patient reports that she's been to the doctor twice. Initially she was given an antibiotic, doxycycline and then when she was seen on Friday she was given prednisone and nebulizer and cough syrup. The patient reports that she is continued to have shortness of breath and wheezing as well as a cough. She reports that the symptoms have not been improving. Mid week the patient did have some fevers but has had none in the last 2 days. She occasionally wears oxygen but has had to wear a more often this week. She reports that her right side under her breast is sore from coughing. The patient denies any vomiting headache or vision changes. She does have some swelling of her ankles which she reports is normal for her. She is concerned just given her level of shortness of breath so she decided to come in for further evaluation.   Past Medical History  Diagnosis Date  . Renal disorder   . Arthritis   . Osteoarthritis   . CHF (congestive heart failure)   . Dyspnea   . CAD (coronary artery disease)   . COPD (chronic obstructive pulmonary disease)   . Hypertension   . Hyperlipidemia   . Anxiety   . Allergy   . Osteoporosis   . Proteinuria   . Lymphocytosis   . Dysphagia   . Monocytosis   . Hyponatremia   . Pulmonary hypertension   . Left ventricular hypertrophy   . Hypertriglyceridemia     Patient Active Problem List   Diagnosis Date Noted  . COPD exacerbation 05/16/2015  . Bronchitis, acute, with bronchospasm 05/16/2015  . Cough 05/14/2015  .  Pneumonia, organism unspecified 05/14/2015  . Dyspnea   . CAD (coronary artery disease)   . COPD (chronic obstructive pulmonary disease)   . Hypertension   . Hyperlipidemia   . Anxiety   . Allergy   . Osteoporosis   . Proteinuria   . Lymphocytosis   . Dysphagia   . Monocytosis   . Hyponatremia   . Pulmonary hypertension   . Left ventricular hypertrophy   . Hypertriglyceridemia     Past Surgical History  Procedure Laterality Date  . Tubal ligation    . Tonsillectomy    . Right knee surgery    . Appendectomy      Current Outpatient Rx  Name  Route  Sig  Dispense  Refill  . albuterol (PROVENTIL) (2.5 MG/3ML) 0.083% nebulizer solution   Nebulization   Take 3 mLs (2.5 mg total) by nebulization every 4 (four) hours as needed for wheezing or shortness of breath. Do not use with your rescue inhaler; just one or the other   50 mL   1   . ALPRAZolam (XANAX) 0.25 MG tablet   Oral   Take 0.25 mg by mouth 2 (two) times daily.         Marland Kitchen amLODipine (NORVASC) 10 MG tablet   Oral   Take 10 mg by mouth daily.         Marland Kitchen aspirin EC 81 MG tablet   Oral  Take 81 mg by mouth daily.         . calcium carbonate (OS-CAL - DOSED IN MG OF ELEMENTAL CALCIUM) 1250 (500 CA) MG tablet   Oral   Take by mouth 2 (two) times daily.         . carvedilol (COREG) 12.5 MG tablet   Oral   Take 12.5 mg by mouth 2 (two) times daily.      1   . chlorpheniramine-HYDROcodone (TUSSIONEX PENNKINETIC ER) 10-8 MG/5ML SUER   Oral   Take 5 mLs by mouth every 12 (twelve) hours as needed for cough.   115 mL   0   . cholecalciferol (VITAMIN D) 1000 UNITS tablet   Oral   Take 2,000 Units by mouth daily.          . clopidogrel (PLAVIX) 75 MG tablet   Oral   Take 75 mg by mouth daily.         Marland Kitchen doxycycline (VIBRA-TABS) 100 MG tablet   Oral   Take 1 tablet (100 mg total) by mouth 2 (two) times daily.   20 tablet   0   . fexofenadine (ALLEGRA) 180 MG tablet   Oral   Take 180 mg by  mouth daily.         . meclizine (ANTIVERT) 25 MG tablet   Oral   Take 1 tablet (25 mg total) by mouth daily as needed for dizziness.   90 tablet   1   . mometasone (NASONEX) 50 MCG/ACT nasal spray   Nasal   Place 2 sprays into the nose daily.          . Multiple Vitamin (MULTI-VITAMINS) TABS   Oral   Take 1 tablet by mouth daily.          Marland Kitchen olmesartan (BENICAR) 40 MG tablet   Oral   Take 40 mg by mouth daily.         . Omega-3 Fatty Acids (FISH OIL) 1000 MG CAPS   Oral   Take by mouth daily.          . predniSONE (DELTASONE) 10 MG tablet      Five pills by mouth today, then 4 on Sat, 3 on Sun, 2 on Monday, and 1 on Tuesday   15 tablet   0   . Probiotic Product (PROBIOTIC DAILY PO)   Oral   Take 1 tablet by mouth daily.          Marland Kitchen PROVENTIL HFA 108 (90 BASE) MCG/ACT inhaler      INHALE 2 PUFFS AS DIRECTED   13.4 Inhaler   1   . raloxifene (EVISTA) 60 MG tablet   Oral   Take 60 mg by mouth daily.         . simvastatin (ZOCOR) 40 MG tablet   Oral   Take 40 mg by mouth daily.         Marland Kitchen Umeclidinium-Vilanterol (ANORO ELLIPTA) 62.5-25 MCG/INH AEPB   Inhalation   Inhale into the lungs.           Allergies Ace inhibitors; Aliskiren-hydrochlorothiazide; Altace; Atorvastatin; Hydralazine hcl; Hydrochlorothiazide; Irbesartan-hydrochlorothiazide; Labetalol hcl; Rosuvastatin; and Tribenzor  Family History  Problem Relation Age of Onset  . Hypertension Father   . Emphysema Father   . Cancer Sister     "female" then stomach  . COPD Sister   . Stroke Brother   . COPD Daughter   . Heart disease Paternal Grandfather   . COPD Maternal  Grandfather     esophageal    Social History Social History  Substance Use Topics  . Smoking status: Never Smoker   . Smokeless tobacco: Never Used  . Alcohol Use: No    Review of Systems Constitutional: No fever/chills Eyes: No visual changes. ENT: No sore throat. Cardiovascular: Denies chest  pain. Respiratory:  shortness of breath cough and wheezing Gastrointestinal: No abdominal pain.  No nausea, no vomiting.  No diarrhea.  No constipation. Genitourinary: Negative for dysuria. Musculoskeletal: Negative for back pain. Skin: Negative for rash. Neurological: Negative for headaches, focal weakness or numbness.  10-point ROS otherwise negative.  ____________________________________________   PHYSICAL EXAM:  VITAL SIGNS: ED Triage Vitals  Enc Vitals Group     BP 05/18/15 2201 134/57 mmHg     Pulse Rate 05/18/15 2157 73     Resp 05/18/15 2157 24     Temp 05/18/15 2157 98.1 F (36.7 C)     Temp Source 05/18/15 2157 Oral     SpO2 05/18/15 2157 96 %     Weight 05/18/15 2157 129 lb (58.514 kg)     Height 05/18/15 2157 4\' 11"  (1.499 m)     Head Cir --      Peak Flow --      Pain Score --      Pain Loc --      Pain Edu? --      Excl. in [place]? --     Constitutional: Alert and oriented. Well appearing and in moderate distress. Eyes: Conjunctivae are normal. PERRL. EOMI. Head: Atraumatic. Nose: No congestion/rhinnorhea. Mouth/Throat: Mucous membranes are moist.  Oropharynx non-erythematous. Cardiovascular: Normal rate, regular rhythm. Grossly normal heart sounds.  Good peripheral circulation. Respiratory: Increased respiratory effort.  Diffuse expiratory wheezes throughout Gastrointestinal: Soft and nontender. No distention. Positive bowel sounds Musculoskeletal: Trace lower extremity tenderness nor edema.   Neurologic:  Normal speech and language.  Skin:  Skin is warm, dry and intact. No rash noted. Psychiatric: Mood and affect are normal.   ____________________________________________   LABS (all labs ordered are listed, but only abnormal results are displayed)  Labs Reviewed  BASIC METABOLIC PANEL - Abnormal; Notable for the following:    Glucose, Bld 180 (*)    BUN 38 (*)    Creatinine, Ser 1.12 (*)    GFR calc non Af Amer 42 (*)    GFR calc Af Amer 48 (*)     All other components within normal limits  CBC - Abnormal; Notable for the following:    WBC 17.8 (*)    RBC 3.78 (*)    Hemoglobin 11.5 (*)    HCT 34.4 (*)    All other components within normal limits  TROPONIN I   ____________________________________________  EKG  ED ECG REPORT I, Loney Hering, the attending physician, personally viewed and interpreted this ECG.   Date: 05/19/2015  EKG Time: 2209  Rate: 75  Rhythm: normal sinus rhythm  Axis: Normal  Intervals:none  ST&T Change: None  ____________________________________________  RADIOLOGY  Chest x-ray: Stable exam, no evidence of acute cardiopulmonary disease, COPD and chronic cardiomegaly ____________________________________________   PROCEDURES  Procedure(s) performed: None  Critical Care performed: Yes, see critical care note(s)   CRITICAL CARE Performed by: Charlesetta Ivory P   Total critical care time: 45 min  Critical care time was exclusive of separately billable procedures and treating other patients.  Critical care was necessary to treat or prevent imminent or life-threatening deterioration.  Critical care was  time spent personally by me on the following activities: development of treatment plan with patient and/or surrogate as well as nursing, discussions with consultants, evaluation of patient's response to treatment, examination of patient, obtaining history from patient or surrogate, ordering and performing treatments and interventions, ordering and review of laboratory studies, ordering and review of radiographic studies, pulse oximetry and re-evaluation of patient's condition.   ____________________________________________   INITIAL IMPRESSION / ASSESSMENT AND PLAN / ED COURSE  Pertinent labs & imaging results that were available during my care of the patient were reviewed by me and considered in my medical decision making (see chart for details).  This is a 81 year old female who  comes in today with some shortness of breath she's had for over a week. Patient has some significant diffuse wheezing throughout all lung fields on exam. I did write for the patient to receive a dose of Solu-Medrol 125 mg IV 1 and 2 DuoNeb's. I'll reassess the patient when she receives her medication. The patient does have white blood cell count of 17.8 but she has been on steroids for the last 3 days.  After the initial treatments the patient continued to have some wheezing and shortness of breath. I gave the patient a third DuoNeb as well as some magnesium sulfate. The patient also had some azithromycin ordered given her history of COPD. The patient continued to have some tachypnea and shortness of breath as well as continued scattered wheezes. The decision was made to admit the patient to the hospitalist service for COPD exacerbation and bronchitis. ____________________________________________   FINAL CLINICAL IMPRESSION(S) / ED DIAGNOSES  Final diagnoses:  Bronchitis  Chronic obstructive pulmonary disease with acute exacerbation      Loney Hering, MD 05/19/15 [phone]

## 2015-05-19 NOTE — Progress Notes (Signed)
Pt received discharge orders by Dr. Manuella Ghazi after stat EKG confirmed that pt was in SVT.  After orders was placed for transfer to 2A pt converterd bach to NSR HR 80's. Dr. Manuella Ghazi gave orders to cancel transfer and for pt to remain on 2C.

## 2015-05-19 NOTE — Progress Notes (Signed)
Marisa Hicks is a 81 y.o. female  [id number]  Primary Cardiologist: Neoma Laming Reason for Consultation: Supraventricular tachycardia  HPI: This is a 81 year old pleasant white female who presented to the emergency room with episode of palpitation and shortness of breath and was found to have a supraventricular tachycardia with heart rate 160-170. She was given adenosine and that converted to sinus rhythm with heart rate 75 bpm right now. Patient is still having intermittent fluttering in the chest associated with SVT. She been having low-grade fever with bronchitis-type of symptoms and has been placed on antibiotics and inhalers.   Review of Systems: Review of Systems  Constitutional: Positive for fever.  Respiratory: Positive for cough, sputum production, shortness of breath and wheezing.   Cardiovascular: Positive for chest pain, palpitations and orthopnea.  Neurological: Positive for headaches.  All other systems reviewed and are negative.     Past Medical History  Diagnosis Date  . Renal disorder   . Arthritis   . Osteoarthritis   . CHF (congestive heart failure)   . Dyspnea   . CAD (coronary artery disease)   . COPD (chronic obstructive pulmonary disease)   . Hypertension   . Hyperlipidemia   . Anxiety   . Allergy   . Osteoporosis   . Proteinuria   . Lymphocytosis   . Dysphagia   . Monocytosis   . Hyponatremia   . Pulmonary hypertension   . Left ventricular hypertrophy   . Hypertriglyceridemia     Medications Prior to Admission  Medication Sig Dispense Refill  . acidophilus (RISAQUAD) CAPS capsule Take 1 capsule by mouth daily.    Marland Kitchen albuterol (PROVENTIL) (2.5 MG/3ML) 0.083% nebulizer solution Take 3 mLs (2.5 mg total) by nebulization every 4 (four) hours as needed for wheezing or shortness of breath. Do not use with your rescue inhaler; just one or the other 50 mL 1  . ALPRAZolam (XANAX) 0.25 MG tablet Take 0.25 mg by mouth 2 (two) times daily.    Marland Kitchen  amLODipine (NORVASC) 10 MG tablet Take 10 mg by mouth daily.    Marland Kitchen aspirin EC 81 MG tablet Take 81 mg by mouth daily.    . calcium-vitamin D (OSCAL WITH D) 500-200 MG-UNIT per tablet Take 1 tablet by mouth 2 (two) times daily.    . carvedilol (COREG) 12.5 MG tablet Take 12.5 mg by mouth 2 (two) times daily.    . chlorpheniramine-HYDROcodone (TUSSIONEX PENNKINETIC ER) 10-8 MG/5ML SUER Take 5 mLs by mouth every 12 (twelve) hours as needed for cough. 115 mL 0  . cholecalciferol (VITAMIN D) 1000 UNITS tablet Take 2,000 Units by mouth daily.     . clopidogrel (PLAVIX) 75 MG tablet Take 75 mg by mouth daily.    Marland Kitchen doxycycline (VIBRA-TABS) 100 MG tablet Take 1 tablet (100 mg total) by mouth 2 (two) times daily. 20 tablet 0  . fexofenadine (ALLEGRA) 180 MG tablet Take 180 mg by mouth daily.    . mometasone (NASONEX) 50 MCG/ACT nasal spray Place 2 sprays into the nose daily.    . Multiple Vitamins-Minerals (MULTIVITAMIN WITH MINERALS) tablet Take 1 tablet by mouth daily.    Marland Kitchen olmesartan (BENICAR) 40 MG tablet Take 40 mg by mouth daily.    . Omega-3 Fatty Acids (FISH OIL) 1000 MG CAPS Take by mouth daily.     . raloxifene (EVISTA) 60 MG tablet Take 60 mg by mouth daily.    . simvastatin (ZOCOR) 40 MG tablet Take 40 mg by mouth  daily.    . Umeclidinium-Vilanterol (ANORO ELLIPTA) 62.5-25 MCG/INH AEPB Inhale 1 puff into the lungs daily.    . meclizine (ANTIVERT) 25 MG tablet Take 1 tablet (25 mg total) by mouth daily as needed for dizziness. (Patient not taking: Reported on 05/19/2015) 90 tablet 1  . predniSONE (DELTASONE) 10 MG tablet Five pills by mouth today, then 4 on Sat, 3 on Sun, 2 on Monday, and 1 on Tuesday (Patient not taking: Reported on 05/19/2015) 15 tablet 0  . PROVENTIL HFA 108 (90 BASE) MCG/ACT inhaler INHALE 2 PUFFS AS DIRECTED (Patient not taking: Reported on 05/19/2015) 13.4 Inhaler 1     . acidophilus  1 capsule Oral Daily  . adenosine (ADENOCARD) IV  6 mg Intravenous STAT  . ALPRAZolam   0.25 mg Oral BID  . amLODipine  10 mg Oral Daily  . aspirin EC  81 mg Oral Daily  . [START ON 05/20/2015] azithromycin  500 mg Oral Daily  . calcium-vitamin D  1 tablet Oral BID  . carvedilol  12.5 mg Oral BID  . cholecalciferol  2,000 Units Oral Daily  . clopidogrel  75 mg Oral Daily  . docusate sodium  100 mg Oral BID  . fluticasone  1 spray Each Nare Daily  . heparin  5,000 Units Subcutaneous 3 times per day  . loratadine  10 mg Oral Daily  . methylPREDNISolone (SOLU-MEDROL) injection  60 mg Intravenous Q6H  . multivitamin with minerals  1 tablet Oral Daily  . omega-3 acid ethyl esters  1 g Oral Daily  . raloxifene  60 mg Oral Daily  . Umeclidinium-Vilanterol  1 puff Inhalation Daily    Infusions: . sodium chloride      Allergies  Allergen Reactions  . Ace Inhibitors   . Aliskiren-Hydrochlorothiazide Other (See Comments)  . Altace [Ramipril] Other (See Comments)    angioedema  . Atorvastatin Other (See Comments)  . Hydralazine Hcl   . Hydrochlorothiazide Other (See Comments)    hyponatremia  . Irbesartan-Hydrochlorothiazide Other (See Comments)  . Labetalol Hcl   . Rosuvastatin Other (See Comments)  . Tribenzor [Olmesartan-Amlodipine-Hctz] Other (See Comments)    Low sodium    Social History   Social History  . Marital Status: Widowed    Spouse Name: N/A  . Number of Children: N/A  . Years of Education: N/A   Occupational History  . Not on file.   Social History Main Topics  . Smoking status: Never Smoker   . Smokeless tobacco: Never Used  . Alcohol Use: No  . Drug Use: No  . Sexual Activity: Not on file   Other Topics Concern  . Not on file   Social History Narrative    Family History  Problem Relation Age of Onset  . Hypertension Father   . Emphysema Father   . Cancer Sister     "female" then stomach  . COPD Sister   . Stroke Brother   . COPD Daughter   . Heart disease Paternal Grandfather   . COPD Maternal Grandfather     esophageal     PHYSICAL EXAM: Filed Vitals:   05/19/15 1552  BP: 142/68  Pulse: 79  Temp: 98.8 F (37.1 C)  Resp: 18     Intake/Output Summary (Last 24 hours) at 05/19/15 1620 Last data filed at 05/19/15 1552  Gross per 24 hour  Intake    360 ml  Output    300 ml  Net     60 ml  General:  Well appearing. No respiratory difficulty HEENT: normal Neck: supple. no JVD. Carotids 2+ bilat; no bruits. No lymphadenopathy or thryomegaly appreciated. Cor: PMI nondisplaced. Regular rate & rhythm. No rubs, gallops or murmurs. Lungs: clear Abdomen: soft, nontender, nondistended. No hepatosplenomegaly. No bruits or masses. Good bowel sounds. Extremities: no cyanosis, clubbing, rash, edema Neuro: alert & oriented x 3, cranial nerves grossly intact. moves all 4 extremities w/o difficulty. Affect pleasant.  ECG: Baseline EKG revealed normal normal sinus rhythm no acute changes. At 75 bpm. Original EKG had supraventricular tachycardia with significant ST depression diffusely at a heart rate of 160/min  Results for orders placed or performed during the hospital encounter of 05/18/15 (from the past 24 hour(s))  Basic metabolic panel     Status: Abnormal   Collection Time: 05/18/15 10:16 PM  Result Value Ref Range   Sodium 137 135 - 145 mmol/L   Potassium 4.4 3.5 - 5.1 mmol/L   Chloride 104 101 - 111 mmol/L   CO2 23 22 - 32 mmol/L   Glucose, Bld 180 (H) 65 - 99 mg/dL   BUN 38 (H) 6 - 20 mg/dL   Creatinine, Ser 1.12 (H) 0.44 - 1.00 mg/dL   Calcium 9.2 8.9 - 10.3 mg/dL   GFR calc non Af Amer 42 (L) >60 mL/min   GFR calc Af Amer 48 (L) >60 mL/min   Anion gap 10 5 - 15  CBC     Status: Abnormal   Collection Time: 05/18/15 10:16 PM  Result Value Ref Range   WBC 17.8 (H) 3.6 - 11.0 K/uL   RBC 3.78 (L) 3.80 - 5.20 MIL/uL   Hemoglobin 11.5 (L) 12.0 - 16.0 g/dL   HCT 34.4 (L) 35.0 - 47.0 %   MCV 90.9 80.0 - 100.0 fL   MCH 30.4 26.0 - 34.0 pg   MCHC 33.5 32.0 - 36.0 g/dL   RDW 12.9 11.5 - 14.5 %    Platelets 268 150 - 440 K/uL  Troponin I     Status: None   Collection Time: 05/18/15 10:16 PM  Result Value Ref Range   Troponin I <0.03 <0.031 ng/mL  TSH     Status: None   Collection Time: 05/18/15 10:16 PM  Result Value Ref Range   TSH 1.090 0.350 - 4.500 uIU/mL  Hemoglobin A1c     Status: None   Collection Time: 05/18/15 10:16 PM  Result Value Ref Range   Hgb A1c MFr Bld 6.0 4.0 - 6.0 %   Dg Chest 2 View  05/18/2015   CLINICAL DATA:  Shortness of breath  EXAM: CHEST  2 VIEW  COMPARISON:  05/15/2015  FINDINGS: Chronic hyperinflation in this patient with COPD. Chronic cardiomegaly and aortic tortuosity which is stable. Extensive coronary atherosclerotic calcification is noted. There is no edema, consolidation, effusion, or pneumothorax. No acute osseous findings.  IMPRESSION: 1. Stable exam.  No evidence of acute cardiopulmonary disease. 2. COPD and chronic cardiomegaly.   Electronically Signed   By: Monte Fantasia M.D.   On: 05/18/2015 23:07     ASSESSMENT AND PLAN: Supraventricular tachycardia in the setting of bronchitis and COPD exacerbation. Right now patient is in sinus rhythm and has been taking metoprolol intermittently for breakthrough arrhythmias. Seems like with the episode of bronchitis and fever and got exacerbated. I would like to start sotalol but the patient is on Zithromax which causes prolongation of QT interval. We will watch overnight how patient does she has recurrent SVT will start sotalol and  stop metoprolol.  KHAN,SHAUKAT A

## 2015-05-19 NOTE — Progress Notes (Signed)
*  PRELIMINARY RESULTS* Echocardiogram 2D Echocardiogram has been performed.  Marisa Hicks 05/19/2015, 6:12 PM

## 2015-05-20 MED ORDER — DOXYCYCLINE HYCLATE 100 MG PO TABS
100.0000 mg | ORAL_TABLET | Freq: Two times a day (BID) | ORAL | Status: DC
  Administered 2015-05-20 – 2015-05-23 (×7): 100 mg via ORAL
  Filled 2015-05-20 (×7): qty 1

## 2015-05-20 MED ORDER — SOTALOL HCL 80 MG PO TABS
80.0000 mg | ORAL_TABLET | ORAL | Status: AC
  Filled 2015-05-20: qty 1

## 2015-05-20 MED ORDER — SOTALOL HCL 80 MG PO TABS
80.0000 mg | ORAL_TABLET | Freq: Two times a day (BID) | ORAL | Status: DC
  Administered 2015-05-20 – 2015-05-23 (×7): 80 mg via ORAL
  Filled 2015-05-20 (×8): qty 1

## 2015-05-20 NOTE — Evaluation (Signed)
Physical Therapy Evaluation Patient Details Name: Marisa Hicks MRN: [id number] DOB: [birth year] Today's Date: 05/20/2015   History of Present Illness  Pt is a 81 y.o. female presenting to hospital with SOB and cough worsening last 5 days and admitted with acute on chronic respiratory failure secondary to COPD; pt also noted to have supraventricular tachycardia.  Clinical Impression  Currently pt demonstrates impairments with cardiopulmonary status limiting functional mobility.  Prior to admission, pt was independent with [place].  Pt's daughter lives with pt intermittently but pt can have 24/7 assist from other family members as needed (per pt and pt's daughter).  Currently pt is CGA with transfers and ambulation with [employer]; distance limited d/t SOB with exertion and wheezing also noted.  Pt would benefit from skilled PT to address above noted impairments and functional limitations.  Recommend pt discharge to home with support of family when medically appropriate.     Follow Up Recommendations Home health PT (pending pt's progress)    Equipment Recommendations       Recommendations for Other Services       Precautions / Restrictions Precautions Precautions: Fall Restrictions Weight Bearing Restrictions: No      Mobility  Bed Mobility Overal bed mobility: Needs Assistance Bed Mobility: Supine to Sit     Supine to sit: Supervision        Transfers Overall transfer level: Needs assistance Equipment used: Straight cane Transfers: Sit to/from Stand Sit to Stand: Min guard         General transfer comment: steady without loss of balance  Ambulation/Gait Ambulation/Gait assistance: Min guard Ambulation Distance (Feet): 15 Feet Assistive device: Straight cane Gait Pattern/deviations: WFL(Within Functional Limits)     General Gait Details: wheezing and SOB noted with ambulation  Stairs            Wheelchair Mobility    Modified Rankin (Stroke Patients Only)        Balance Overall balance assessment: Needs assistance Sitting-balance support: No upper extremity supported;Feet supported Sitting balance-Leahy Scale: Normal     Standing balance support: No upper extremity supported Standing balance-Leahy Scale: Good                               Pertinent Vitals/Pain Pain Assessment: No/denies pain  See flow sheet for details (HR and O2 WFL on 3 L/min via nasal cannula during session.)    Home Living Family/patient expects to be discharged to:: Private residence (Simultaneous filing. User may not have seen previous data.) Living Arrangements: Children (Pt's daughter lives with pt part-time. Simultaneous filing. User may not have seen previous data.) Available Help at Discharge: Family (Pt and pt's daughter report pt's family can assist 24/7 if needed. Simultaneous filing. User may not have seen previous data.) Type of Home: House (Simultaneous filing. User may not have seen previous data.) Home Access: Level entry (Simultaneous filing. User may not have seen previous data.)     Home Layout: One level (Simultaneous filing. User may not have seen previous data.) Home Equipment: Cane - single point (Simultaneous filing. User may not have seen previous data.) Additional Comments: Home O2 (3 L/min via nasal cannula during day--usually uses with exertion); CPAP at night    Prior Function Level of Independence: Independent with assistive device(s) (Simultaneous filing. User may not have seen previous data.)               Hand Dominance   Dominant Hand: Right  Extremity/Trunk Assessment   Upper Extremity Assessment: Overall WFL for tasks assessed           Lower Extremity Assessment: Overall WFL for tasks assessed         Communication   Communication: No difficulties (Simultaneous filing. User may not have seen previous data.)  Cognition Arousal/Alertness: Awake/alert Behavior During Therapy: WFL for tasks  assessed/performed Overall Cognitive Status: Within Functional Limits for tasks assessed                      General Comments   Nursing cleared pt for participation in physical therapy.  Pt agreeable to PT session.  Pt's daughter present during session.    Exercises   Performed semi-supine B LE therapeutic exercise x 10 reps:  Ankle pumps (AROM B LE's); quad sets x3 second holds (AROM B LE's); glute squeezes x3 second holds (AROM B); SAQ's (AROM R; AROM L); heelslides (AROM R; AROM L), hip abd/adduction (AROM R; AROM L).  Pt required vc's and tactile cues for correct technique with exercises.       Assessment/Plan    PT Assessment Patient needs continued PT services  PT Diagnosis Difficulty walking   PT Problem List Decreased activity tolerance;Cardiopulmonary status limiting activity  PT Treatment Interventions Gait training;Functional mobility training;Therapeutic activities;Therapeutic exercise;Balance training;Patient/family education   PT Goals (Current goals can be found in the Care Plan section) Acute Rehab PT Goals Patient Stated Goal: To go home PT Goal Formulation: With patient Time For Goal Achievement: 05/27/15 Potential to Achieve Goals: Good    Frequency Min 2X/week   Barriers to discharge        Co-evaluation               End of Session Equipment Utilized During Treatment: Gait belt;Oxygen (3 L/min via nasal cannula) Activity Tolerance: Other (comment) (Limited d/t SOB/wheezing with rest and activity) Patient left: in chair;with call bell/phone within reach;with chair alarm set;with nursing/sitter in room;with family/visitor present Nurse Communication: Mobility status (O2 saturations)         Time: 7209-4709 PT Time Calculation (min) (ACUTE ONLY): 34 min   Charges:   PT Evaluation $Initial PT Evaluation Tier I: 1 Procedure PT Treatments $Therapeutic Exercise: 8-22 mins   PT G CodesLeitha Bleak 06-06-15, 5:10  PM Leitha Bleak, [place]

## 2015-05-20 NOTE — Progress Notes (Signed)
Date: 05/20/2015,   MRN# [id number] Marisa Hicks December 09, 1921 Code Status:     Code Status Orders        Start     Ordered   05/19/15 0624  Full code   Continuous     05/19/15 0623     Hosp day:@LENGTHOFSTAYDAYS @ Referring MD: @ATDPROV @         AdmissionWeight: 129 lb (58.514 kg)                 CurrentWeight: 138 lb 12.8 oz (62.959 kg)  HPI: This is a 50 year lady, still coughing and wheezing, maybe a little better.   PMHX:   Past Medical History  Diagnosis Date  . Renal disorder   . Arthritis   . Osteoarthritis   . CHF (congestive heart failure)   . Dyspnea   . CAD (coronary artery disease)   . COPD (chronic obstructive pulmonary disease)   . Hypertension   . Hyperlipidemia   . Anxiety   . Allergy   . Osteoporosis   . Proteinuria   . Lymphocytosis   . Dysphagia   . Monocytosis   . Hyponatremia   . Pulmonary hypertension   . Left ventricular hypertrophy   . Hypertriglyceridemia    Surgical Hx:  Past Surgical History  Procedure Laterality Date  . Tubal ligation    . Tonsillectomy    . Right knee surgery    . Appendectomy     Family Hx:  Family History  Problem Relation Age of Onset  . Hypertension Father   . Emphysema Father   . Cancer Sister     "female" then stomach  . COPD Sister   . Stroke Brother   . COPD Daughter   . Heart disease Paternal Grandfather   . COPD Maternal Grandfather     esophageal   Social Hx:   Social History  Substance Use Topics  . Smoking status: Never Smoker   . Smokeless tobacco: Never Used  . Alcohol Use: No   Medication:    Home Medication:  No current outpatient prescriptions on file.  Current Medication: @CURMEDTAB @   Allergies:  Ace inhibitors; Aliskiren-hydrochlorothiazide; Altace; Atorvastatin; Hydralazine hcl; Hydrochlorothiazide; Irbesartan-hydrochlorothiazide; Labetalol hcl; Rosuvastatin; and Tribenzor  Review of Systems: Gen:  Denies  fever, sweats, chills HEENT: Denies blurred vision, double  vision, ear pain, eye pain, hearing loss, nose bleeds, sore throat Cvc:  No dizziness, chest pain or heaviness Resp:  Cough, breaking up a little, wheezing, hemoptysis   Gi: Denies swallowing difficulty, stomach pain, nausea or vomiting, diarrhea, constipation, bowel incontinence Gu:  Denies bladder incontinence, burning urine Ext:   No Joint pain, stiffness or swelling Skin: No skin rash, easy bruising or bleeding or hives Endoc:  No polyuria, polydipsia , polyphagia or weight change Psych: No depression, insomnia or hallucinations  Other:  All other systems negative  Physical Examination:   VS: BP 151/61 mmHg  Pulse 75  Temp(Src) 97.6 F (36.4 C) (Oral)  Resp 18  Ht 4\' 11"  (1.499 m)  Wt 138 lb 12.8 oz (62.959 kg)  BMI 28.02 kg/m2  SpO2 98%  General Appearance: No distress  Neuro/psych without focal findings, mental status, speech normal, alert and oriented, cranial nerves 2-12 intact, reflexes normal and symmetric, sensation grossly normal  HEENT: PERRLA, EOM intact, no ptosis, no other lesions noticed Pulmonary:.+ ve wheezing, No rales, - rub, no use of accessory  muscles   Cardiovascular:  Normal S1,S2. Rrr,    Abdomen:Benign, Soft, non-tender, No  masses, hepatosplenomegaly, No lymphadenopathy Endoc: No evident thyromegaly, no signs of acromegaly or Cushing features Skin:   warm, no rashes, no ecchymosis  Extremities: normal, no cyanosis, clubbing, no edema, warm with normal capillary refill. Other findings:   Labs results:   Recent Labs     05/18/15  2216  HGB  11.5*  HCT  34.4*  MCV  90.9  WBC  17.8*  BUN  38*  CREATININE  1.12*  GLUCOSE  180*  CALCIUM  9.2  ,     Assessment and Plan: Copd (moderate) here with exacerbation, slowly improving. Elevated WBC, 2/2 steroids, ? Infection Obstructive sleep apnea (adult) tolerating her her cpap well, cpap is here   Bronchiectasis, flare    Oxygen desaturation secondary to the above. On 0xygen, ([employer] system)  at home DOE secondary to the above as well as her weight  Plan: -continue anoro, albuterol -solumedrol 50 mg iv tid -emperic antibiotics -cpap on home -dvt prophylaxis -flutter valve   I have personally obtained a history, examined the patient, evaluated laboratory and imaging results, formulated the assessment and plan and placed orders.  The Patient requires high complexity decision making for assessment and support, frequent evaluation and titration of therapies, application of advanced monitoring technologies and extensive interpretation of multiple databases.   Wallene Huh, M.D. Pulmonary & Critical care Medicine [facility]

## 2015-05-20 NOTE — Evaluation (Signed)
Occupational Therapy Evaluation Patient Details Name: Marisa Hicks MRN: [id number] DOB: 08-Nov-1921 Today's Date: 05/20/2015    History of Present Illness Pt is a 81 y.o. female presenting to hospital with SOB and cough worsening last 5 days and admitted with acute on chronic respiratory failure secondary to COPD; pt also noted to have supraventricular tachycardia.   Clinical Impression   Pt seen for ADL assessment and is able to complete all self care skills at same level as prior to admission which her daughter and son agreed with.  She has decreased endurance for ADLs with SOB and coughing and rec she use reacher for any tasks requiring leaning forward and to take rest breaks as needed and use pursed lip breathing when SOB starts.  Rec assistance for household tasks if possible.  No further OT rec at this time.    Follow Up Recommendations  No OT follow up    Equipment Recommendations       Recommendations for Other Services       Precautions / Restrictions Precautions Precautions: Fall Restrictions Weight Bearing Restrictions: No      Mobility Bed Mobility                  Transfers                      Balance                                            ADL                                         General ADL Comments: Pt is able to complete ADLs as she did prior to admission but needs several rest breaks to accomplish task and uses SPC for ambulation or holds onto IV pole.  Educated pt in AD avaialbe to help and rec continued use of reacher.       Vision     Perception     Praxis      Pertinent Vitals/Pain Pain Assessment: No/denies pain     Hand Dominance Right   Extremity/Trunk Assessment Upper Extremity Assessment Upper Extremity Assessment: Overall WFL for tasks assessed   Lower Extremity Assessment Lower Extremity Assessment: Defer to PT evaluation       Communication  Communication Communication: No difficulties (Simultaneous filing. User may not have seen previous data.)   Cognition Arousal/Alertness: Awake/alert Behavior During Therapy: WFL for tasks assessed/performed Overall Cognitive Status: Within Functional Limits for tasks assessed                     General Comments       Exercises       Shoulder Instructions      Home Living Family/patient expects to be discharged to:: Private residence (Simultaneous filing. User may not have seen previous data.) Living Arrangements: Children (Pt's daughter lives with pt part-time. Simultaneous filing. User may not have seen previous data.) Available Help at Discharge: Family (Pt and pt's daughter report pt's family can assist 24/7 if needed. Simultaneous filing. User may not have seen previous data.) Type of Home: House (Simultaneous filing. User may not have seen previous data.) Home Access: Level entry (Simultaneous filing. User may not have seen  previous data.)     Home Layout: One level (Simultaneous filing. User may not have seen previous data.)     Bathroom Shower/Tub: Occupational psychologist: Standard Bathroom Accessibility: Yes How Accessible: Other (comment) ([place]) Home Equipment: Cane - single point (Simultaneous filing. User may not have seen previous data.)   Additional Comments: Home O2 (3 L/min via nasal cannula during day--usually uses with exertion); CPAP at night      Prior Functioning/Environment Level of Independence: Independent with assistive device(s) (Simultaneous filing. User may not have seen previous data.)             OT Diagnosis:     OT Problem List:     OT Treatment/Interventions:      OT Goals(Current goals can be found in the care plan section)    OT Frequency:     Barriers to D/C:            Co-evaluation              End of Session    Activity Tolerance: Patient tolerated treatment well Patient left: in chair;with  chair alarm set;with family/visitor present (son and daughter present)   Time: 0626-9485 OT Time Calculation (min): 27 min Charges:  OT General Charges $OT Visit: 1 Procedure OT Evaluation $Initial OT Evaluation Tier I: 1 Procedure OT Treatments $Self Care/Home Management : 8-22 mins G-Codes:    Wofford,Susan June 01, 2015, 5:01 PM   Chrys Racer, OTR/L ascom [phone]

## 2015-05-20 NOTE — Progress Notes (Signed)
Initial Nutrition Assessment       INTERVENTION:  Meals and snacks: Cater to pt preferences    NUTRITION DIAGNOSIS:    (None at this time) related to   as evidenced by  .    GOAL:   Patient will meet greater than or equal to 90% of their needs    MONITOR:    (Energy intake, Electrolyte and renal profile)  REASON FOR ASSESSMENT:   Consult Assessment of nutrition requirement/status  ASSESSMENT:      Pt admitted with COPD exacerbation, tachycardia, bronchitis  Past Medical History  Diagnosis Date  . Renal disorder   . Arthritis   . Osteoarthritis   . CHF (congestive heart failure)   . Dyspnea   . CAD (coronary artery disease)   . COPD (chronic obstructive pulmonary disease)   . Hypertension   . Hyperlipidemia   . Anxiety   . Allergy   . Osteoporosis   . Proteinuria   . Lymphocytosis   . Dysphagia   . Monocytosis   . Hyponatremia   . Pulmonary hypertension   . Left ventricular hypertrophy   . Hypertriglyceridemia     Current Nutrition: ate most of breakfast this am  Food/Nutrition-Related History: reports good appetite prior to admission, eating 3 meals per day    Medications: NS at 85ml/hr, calcium and vit D, colace, omega 3, MVI  Electrolyte/Renal Profile and Glucose Profile:   Recent Labs Lab 05/18/15 2216  NA 137  K 4.4  CL 104  CO2 23  BUN 38*  CREATININE 1.12*  CALCIUM 9.2  GLUCOSE 180*     Last BM:8/15    Weight Change: stable weight per pt prior to admission    Diet Order:  Diet Heart Room service appropriate?: Yes; Fluid consistency:: Thin  Skin:   reviewed   Height:   Ht Readings from Last 1 Encounters:  05/18/15 4\' 11"  (1.499 m)    Weight:   Wt Readings from Last 1 Encounters:  05/20/15 138 lb 12.8 oz (62.959 kg)     BMI:  Body mass index is 28.02 kg/(m^2).   EDUCATION NEEDS:   No education needs identified at this time  LOW Care Level  Marisa Hicks B. Zenia Resides, [place], [place] (pager)

## 2015-05-20 NOTE — Progress Notes (Signed)
[place] [facility] [place] NAME: Marisa Hicks    MR#:  [id number]  DATE OF BIRTH:  March 06, 1922  SUBJECTIVE:  CHIEF COMPLAINT:   Chief Complaint  Patient presents with  . Shortness of Breath   about same, maybe a little better. Daughter at bedside, having trouble expectorating. REVIEW OF SYSTEMS:  Review of Systems  Constitutional: Negative for fever, weight loss, malaise/fatigue and diaphoresis.  HENT: Negative for ear discharge, ear pain, hearing loss, nosebleeds, sore throat and tinnitus.   Eyes: Negative for blurred vision and pain.  Respiratory: Positive for cough, sputum production, shortness of breath and wheezing. Negative for hemoptysis.   Cardiovascular: Negative for chest pain, palpitations, orthopnea and leg swelling.  Gastrointestinal: Negative for heartburn, nausea, vomiting, abdominal pain, diarrhea, constipation and blood in stool.  Genitourinary: Negative for dysuria, urgency and frequency.  Musculoskeletal: Negative for myalgias and back pain.  Skin: Negative for itching and rash.  Neurological: Negative for dizziness, tingling, tremors, focal weakness, seizures, weakness and headaches.  Psychiatric/Behavioral: Negative for depression. The patient is not nervous/anxious.    DRUG ALLERGIES:   Allergies  Allergen Reactions  . Ace Inhibitors   . Aliskiren-Hydrochlorothiazide Other (See Comments)  . Altace [Ramipril] Other (See Comments)    angioedema  . Atorvastatin Other (See Comments)  . Hydralazine Hcl   . Hydrochlorothiazide Other (See Comments)    hyponatremia  . Irbesartan-Hydrochlorothiazide Other (See Comments)  . Labetalol Hcl   . Rosuvastatin Other (See Comments)  . Tribenzor [Olmesartan-Amlodipine-Hctz] Other (See Comments)    Low sodium   VITALS:  Blood pressure 151/61, pulse 75, temperature 97.6 F (36.4 C), temperature source Oral, resp. rate 18, height 4\' 11"  (1.499 m), weight 62.959 kg (138 lb 12.8  oz), SpO2 100 %. PHYSICAL EXAMINATION:  Physical Exam  Constitutional: She is oriented to person, place, and time and well-developed, well-nourished, and in no distress.  HENT:  Head: Normocephalic and atraumatic.  Eyes: Conjunctivae and EOM are normal. Pupils are equal, round, and reactive to light.  Neck: Normal range of motion. Neck supple. No tracheal deviation present. No thyromegaly present.  Cardiovascular: Normal rate, regular rhythm and normal heart sounds.   Pulmonary/Chest: She is in respiratory distress. She has wheezes. She has rales. She exhibits no tenderness.  Abdominal: Soft. Bowel sounds are normal. She exhibits no distension. There is no tenderness.  Musculoskeletal: Normal range of motion.  Neurological: She is alert and oriented to person, place, and time. No cranial nerve deficit.  Skin: Skin is warm and dry. No rash noted.  Psychiatric: Mood and affect normal.   LABORATORY PANEL:   CBC  Recent Labs Lab 05/18/15 2216  WBC 17.8*  HGB 11.5*  HCT 34.4*  PLT 268   ------------------------------------------------------------------------------------------------------------------ Chemistries   Recent Labs Lab 05/18/15 2216  NA 137  K 4.4  CL 104  CO2 23  GLUCOSE 180*  BUN 38*  CREATININE 1.12*  CALCIUM 9.2   RADIOLOGY:  No results found. ASSESSMENT AND PLAN:  81 year old Caucasian female admitted for acute on chronic respiratory failure.  1. Acute on chronic respiratory failure: Secondary to COPD: Continue IV steroids, antibiotics (change to Doxycycline from Zithromax considering cardiology wanting to start sotalol) and breathing treatments as necessary. Continue inhaled corticosteroid.  Pulmo rehabilitation after discharge  2. Hypertension: continue carvedilol, amlodipine and ARB 3. CAD: Stable, continue Plavix and aspirin 4. Congestive heart failure: Diastolic, chronic. Stable 5.  Supraventricular tachycardia: Cardiology, started sotalol.  She is  been  in normal sinus rhythm now 6.DVT prophylaxis: Heparin 7. GI prophylaxis: None   All the records are reviewed and case discussed with Care Management/Social Worker. Management plans discussed with the patient, family and they are in agreement.  CODE STATUS: Full code  TOTAL TIME TAKING CARE OF THIS PATIENT: 35 minutes.   More than 50% of the time was spent in counseling/coordination of care: YES  POSSIBLE D/C IN 1-2 DAYS, DEPENDING ON CLINICAL CONDITION.   [facility], Jolanta Cabeza M.D on 05/20/2015 at 5:12 PM  Between 7am to 6pm - Pager - [phone]  After 6pm go to [email or web address] - password [email or web address] [place] [facility]  Office  [phone]  CC: Primary care physician; Enid Derry, MD

## 2015-05-20 NOTE — Care Management Note (Signed)
Case Management Note  Patient Details  Name: Marisa Hicks MRN: [id number] Date of Birth: 09/05/1922  Subjective/Objective:              Patient stated that she is from home alone but that her adult daughter lives with her sometimes and helps her. She has family in the area. Healthcare POA   Is her son Marisa Hicks.  PCP is [facility]. Has home 02 with [employer]. Concentrator and portable. Has never had Home Health before list of providers given. Sitter list given. Ambulates with cane has no walker at home. Denies issues with medications co-pays. Requested PT evaluation.  Continue to follow.    Action/Plan:   Expected Discharge Date:                  Expected Discharge Plan:  [place]  In-House Referral:     Discharge planning Services  CM Consult  Post Acute Care Choice:  Durable Medical Equipment Choice offered to:  Patient  DME Arranged:  Walker rolling DME Agency:  [place]:    [facility] Agency:     Status of Service:  In process, will continue to follow  Medicare Important Message Given:    Date Medicare IM Given:    Medicare IM give by:    Date Additional Medicare IM Given:    Additional Medicare Important Message give by:     If discussed at [place] of Stay Meetings, dates discussed:    Additional Comments:  Alvie Heidelberg, RN 05/20/2015, 2:49 PM

## 2015-05-20 NOTE — Progress Notes (Signed)
SUBJECTIVE: patient still short of breath   Filed Vitals:   05/20/15 0014 05/20/15 0500 05/20/15 0740 05/20/15 0812  BP:    165/61  Pulse:    83  Temp: 97.4 F (36.3 C)   97.9 F (36.6 C)  TempSrc: Oral   Oral  Resp:      Height:      Weight:  62.959 kg (138 lb 12.8 oz)    SpO2:   97% 99%    Intake/Output Summary (Last 24 hours) at 05/20/15 0848 Last data filed at 05/20/15 0819  Gross per 24 hour  Intake 2159.11 ml  Output   1150 ml  Net 1009.11 ml    LABS: Basic Metabolic Panel:  Recent Labs  05/18/15 2216  NA 137  K 4.4  CL 104  CO2 23  GLUCOSE 180*  BUN 38*  CREATININE 1.12*  CALCIUM 9.2   Liver Function Tests: No results for input(s): AST, ALT, ALKPHOS, BILITOT, PROT, ALBUMIN in the last 72 hours. No results for input(s): LIPASE, AMYLASE in the last 72 hours. CBC:  Recent Labs  05/18/15 2216  WBC 17.8*  HGB 11.5*  HCT 34.4*  MCV 90.9  PLT 268   Cardiac Enzymes:  Recent Labs  05/18/15 2216  TROPONINI <0.03   BNP: Invalid input(s): POCBNP D-Dimer: No results for input(s): DDIMER in the last 72 hours. Hemoglobin A1C:  Recent Labs  05/18/15 2216  HGBA1C 6.0   Fasting Lipid Panel: No results for input(s): CHOL, HDL, LDLCALC, TRIG, CHOLHDL, LDLDIRECT in the last 72 hours. Thyroid Function Tests:  Recent Labs  05/18/15 2216  TSH 1.090   Anemia Panel: No results for input(s): VITAMINB12, FOLATE, FERRITIN, TIBC, IRON, RETICCTPCT in the last 72 hours.   PHYSICAL EXAM General: Well developed, well nourished, in no acute distress HEENT:  Normocephalic and atramatic Neck:  No JVD.  Lungs: Clear bilaterally to auscultation and percussion. Heart: HRRR . Normal S1 and S2 without gallops or murmurs.  Abdomen: Bowel sounds are positive, abdomen soft and non-tender  Msk:  Back normal, normal gait. Normal strength and tone for age. Extremities: No clubbing, cyanosis or edema.   Neuro: Alert and oriented X 3. Psych:  Good affect, responds  appropriately  TELEMETRY: NSR  ASSESSMENT AND PLAN: SVT,and elevated BP, will start sotolol and d/c coreg as EF ok.  Active Problems:   Acute on chronic respiratory failure    Marisa Hicks A, MD, [facility] 05/20/2015 8:48 AM

## 2015-05-21 MED ORDER — OLMESARTAN MEDOXOMIL 20 MG PO TABS
40.0000 mg | ORAL_TABLET | Freq: Every morning | ORAL | Status: DC
Start: 2015-05-21 — End: 2015-05-23
  Administered 2015-05-21 – 2015-05-23 (×3): 40 mg via ORAL
  Filled 2015-05-21 (×4): qty 2

## 2015-05-21 NOTE — Clinical Social Work Note (Addendum)
Clinical Social Work Assessment  Patient Details  Name: Marisa Hicks MRN: [id number] Date of Birth: [birth year]  Date of referral:  05/19/15               Reason for consult:  Other (Comment Required) (COPD Gold)                Permission sought to share information with:  Family Supports Permission granted to share information::  Yes, Verbal Permission Granted  Name::        Agency::     Relationship::     Contact Information:     Housing/Transportation Living arrangements for the past 2 months:  [place] of Information:  Patient, Adult Children [name], daughter) Patient Interpreter Needed:  None Criminal Activity/Legal Involvement Pertinent to Current Situation/Hospitalization:  No - Comment as needed Significant Relationships:  Adult Children Lives with:  Self, Adult Children Do you feel safe going back to the place where you live?  Yes Need for family participation in patient care:  Yes (Comment)  Care giving concerns:  Pt is weak and needs to regain strength.   Social Worker assessment / plan:  CSW met with pt and daughter, [name] to address COPD Gold consult.  Pt is a 81 y/o widowed female who has 5 adult children.  Pt lives in a private residence and her resides there also at times.  Pt had a bright affect and denied any anxiety or depression symptoms. Pt expressed interest in home health services.  CSW explained that RN CM will provide information on home health services.  CSW signing off as there are no further needs at this time.  Employment status:  Retired Health visitor, Managed Care PT Recommendations:  Home with [place] / Referral to community resources:     Patient/Family's Response to care:  Pt and daughter were pleasant and appreciative of CSW assistance.  Patient/Family's Understanding of and Emotional Response to Diagnosis, Current Treatment, and Prognosis:  Pt explained that at times she has difficulty  swallowing but denies anxiety.  Pt acknowledged that she needs assistance in the home to regain strength. Pt is hopeful that she will be discharged in the next couple of days and regain functioning at home.  Emotional Assessment Appearance:  Appears younger than stated age Attitude/Demeanor/Rapport:  Other (Cooperative) Affect (typically observed):  Accepting, Calm, Pleasant Orientation:  Oriented to Self, Oriented to Place, Oriented to  Time, Oriented to Situation Alcohol / Substance use:  Not Applicable Psych involvement (Current and /or in the community):  No (Comment)  Discharge Needs  Concerns to be addressed:  No discharge needs identified Readmission within the last 30 days:  No Current discharge risk:  None Barriers to Discharge:  No Barriers Identified   Naida Sleight, LCSW 05/21/2015, 11:33 AM

## 2015-05-21 NOTE — Progress Notes (Signed)
Physical Therapy Treatment Patient Details Name: Marisa Hicks MRN: [id number] DOB: 03/26/1922 Today's Date: 05/21/2015    History of Present Illness Pt is a 81 y.o. female presenting to hospital with SOB and cough worsening last 5 days and admitted with acute on chronic respiratory failure secondary to COPD; pt also noted to have supraventricular tachycardia.    PT Comments    Pt is primarily limited by fatigue and lethargy with exertion. Denies increased SOB with ambulation but reports considerable SOB at rest in bed although vitals remain WNL at rest. Pt with higher level balance deficits but is safe ambulating with single point cane. Decreased LE power noted with 5 time sit to stand of 16 seconds. Pt will benefit from skilled PT services to address deficits in strength, balance, and mobility in order to return to full function at home.    Follow Up Recommendations  Home health PT     Equipment Recommendations  None recommended by PT    Recommendations for Other Services       Precautions / Restrictions Precautions Precautions: Fall Restrictions Weight Bearing Restrictions: No    Mobility  Bed Mobility Overal bed mobility: Needs Assistance Bed Mobility: Supine to Sit     Supine to sit: Supervision     General bed mobility comments: good sequencing and speed noted  Transfers Overall transfer level: Needs assistance Equipment used: Straight cane Transfers: Sit to/from Stand Sit to Stand: Min guard         General transfer comment: Cues for safe hand placement. Good stability noted. 5TSTS: 16.0 seconds  Ambulation/Gait Ambulation/Gait assistance: Min guard Ambulation Distance (Feet): 175 Feet (125 + 50) Assistive device: Straight cane Gait Pattern/deviations: Decreased step length - right;Decreased step length - left   Gait velocity interpretation: <1.8 ft/sec, indicative of risk for recurrent falls General Gait Details: Wheezing noted but pt denies worsening  DOE with ambulation. 5-6/10 SOB reported both at rest and during ambulation. Continuous monitoring of HR and SaO2 during ambulation. SaO2 remains >95% on 3 L/min O2. Horizontal and vertical head turns performed with patient with some slowing and mild lateral deviations noted   Stairs            Wheelchair Mobility    Modified Rankin (Stroke Patients Only)       Balance Overall balance assessment: Needs assistance Sitting-balance support: No upper extremity supported Sitting balance-Leahy Scale: Normal     Standing balance support: No upper extremity supported Standing balance-Leahy Scale: Good           Rhomberg - Eyes Opened: 30 Rhomberg - Eyes Closed: 20   High Level Balance Comments: Performed standing marching with patient. Rhomberg balance with eyes open, eyes open adding horizontal and vertical head turns, and eyes closed    Cognition Arousal/Alertness: Awake/alert Behavior During Therapy: WFL for tasks assessed/performed Overall Cognitive Status: Within Functional Limits for tasks assessed                      Exercises General Exercises - Lower Extremity Long Arc Quad: Strengthening;Both;10 reps;Seated Heel Slides: Strengthening;Both;10 reps;Seated Hip ABduction/ADduction: Strengthening;Both;10 reps;Seated Hip Flexion/Marching: Strengthening;Both;10 reps;Seated Heel Raises: Strengthening;Both;10 reps;Seated    General Comments        Pertinent Vitals/Pain Pain Assessment: No/denies pain    Home Living                      Prior Function  PT Goals (current goals can now be found in the care plan section) Acute Rehab PT Goals Patient Stated Goal: To go home PT Goal Formulation: With patient Time For Goal Achievement: 05/27/15 Potential to Achieve Goals: Good Progress towards PT goals: Progressing toward goals    Frequency  Min 2X/week    PT Plan Current plan remains appropriate    Co-evaluation              End of Session Equipment Utilized During Treatment: Gait belt;Oxygen Activity Tolerance: Other (comment) (cardiopulmonary endurance) Patient left: in bed;with call bell/phone within reach;with bed alarm set;with family/visitor present (Pt has been in recliner most of the day)     Time: 5789-7847 PT Time Calculation (min) (ACUTE ONLY): 30 min  Charges:  $Gait Training: 8-22 mins $Therapeutic Exercise: 8-22 mins                    G Codes:      Lyndel Safe Zaakirah Kistner PT, DPT   Dawn Kiper 05/21/2015, 4:26 PM

## 2015-05-21 NOTE — Progress Notes (Signed)
   SUBJECTIVE: Still SOB and c/o sinus congestion. No CP or palpitations   Filed Vitals:   05/20/15 2338 05/21/15 0501 05/21/15 0734 05/21/15 0804  BP: 154/53   157/63  Pulse: 66   72  Temp: 98.8 F (37.1 C)   97.6 F (36.4 C)  TempSrc: Oral   Oral  Resp: 16     Height:      Weight:  64.91 kg (143 lb 1.6 oz)    SpO2: 97%  97% 99%    Intake/Output Summary (Last 24 hours) at 05/21/15 1030 Last data filed at 05/21/15 1001  Gross per 24 hour  Intake 1689.15 ml  Output   1900 ml  Net -210.85 ml    LABS: Basic Metabolic Panel:  Recent Labs  05/18/15 2216  NA 137  K 4.4  CL 104  CO2 23  GLUCOSE 180*  BUN 38*  CREATININE 1.12*  CALCIUM 9.2   Liver Function Tests: No results for input(s): AST, ALT, ALKPHOS, BILITOT, PROT, ALBUMIN in the last 72 hours. No results for input(s): LIPASE, AMYLASE in the last 72 hours. CBC:  Recent Labs  05/18/15 2216  WBC 17.8*  HGB 11.5*  HCT 34.4*  MCV 90.9  PLT 268   Cardiac Enzymes:  Recent Labs  05/18/15 2216  TROPONINI <0.03   BNP: Invalid input(s): POCBNP D-Dimer: No results for input(s): DDIMER in the last 72 hours. Hemoglobin A1C:  Recent Labs  05/18/15 2216  HGBA1C 6.0   Fasting Lipid Panel: No results for input(s): CHOL, HDL, LDLCALC, TRIG, CHOLHDL, LDLDIRECT in the last 72 hours. Thyroid Function Tests:  Recent Labs  05/18/15 2216  TSH 1.090   Anemia Panel: No results for input(s): VITAMINB12, FOLATE, FERRITIN, TIBC, IRON, RETICCTPCT in the last 72 hours.   PHYSICAL EXAM General: Well developed, well nourished, in mild acute distress HEENT:  Normocephalic and atramatic Neck:  No JVD.  Lungs: Clear bilaterally to auscultation and percussion. Heart: HRRR . Normal S1 and S2 without gallops or murmurs.  Abdomen: Bowel sounds are positive, abdomen soft and non-tender  Msk:  Back normal, normal gait. Normal strength and tone for age. Extremities: No clubbing, cyanosis or edema.   Neuro: Alert and  oriented X 3. Psych:  Good affect, responds appropriately  TELEMETRY: Reviewed telemetry pt in NSR:  ASSESSMENT AND PLAN: Pt with respiratory failure and SVT. Stable on sotalol, check QTc on EKG. Pt also has elevated BP, has been stable on Benicar for years, non-formulary, advise continuation of benicar, pt has hx of reaction to irbesartan in past.    Patient and plan discussed with supervising provider, Dr. Neoma Laming, who agrees with above findings.   Kelby Fam [place], [place]  05/21/2015 10:30 AM

## 2015-05-21 NOTE — Progress Notes (Signed)
Date: 05/21/2015,   MRN# [id number] ZURII HEWES [birth year] Code Status:     Code Status Orders        Start     Ordered   05/19/15 0624  Full code   Continuous     05/19/15 0623     Hosp day:@LENGTHOFSTAYDAYS @ Referring MD: @ATDPROV @       HPI: Elderly lady,  on 3 liters Ona 02, same complaints, minimum imorovement  PMHX:   Past Medical History  Diagnosis Date  . Renal disorder   . Arthritis   . Osteoarthritis   . CHF (congestive heart failure)   . Dyspnea   . CAD (coronary artery disease)   . COPD (chronic obstructive pulmonary disease)   . Hypertension   . Hyperlipidemia   . Anxiety   . Allergy   . Osteoporosis   . Proteinuria   . Lymphocytosis   . Dysphagia   . Monocytosis   . Hyponatremia   . Pulmonary hypertension   . Left ventricular hypertrophy   . Hypertriglyceridemia    Surgical Hx:  Past Surgical History  Procedure Laterality Date  . Tubal ligation    . Tonsillectomy    . Right knee surgery    . Appendectomy     Family Hx:  Family History  Problem Relation Age of Onset  . Hypertension Father   . Emphysema Father   . Cancer Sister     "female" then stomach  . COPD Sister   . Stroke Brother   . COPD Daughter   . Heart disease Paternal Grandfather   . COPD Maternal Grandfather     esophageal   Social Hx:   Social History  Substance Use Topics  . Smoking status: Never Smoker   . Smokeless tobacco: Never Used  . Alcohol Use: No   Medication:    Home Medication:  No current outpatient prescriptions on file.  Current Medication: @CURMEDTAB @   Allergies:  Ace inhibitors; Aliskiren-hydrochlorothiazide; Altace; Atorvastatin; Hydralazine hcl; Hydrochlorothiazide; Irbesartan-hydrochlorothiazide; Labetalol hcl; Rosuvastatin; and Tribenzor  Review of Systems: Gen:  Denies  fever, sweats, chills HEENT: Denies blurred vision, double vision, ear pain, eye pain, hearing loss, nose bleeds, sore throat Cvc:  No dizziness, chest pain or  heaviness Resp:   Cough, wheezing, sob Gi: Denies swallowing difficulty, stomach pain, nausea or vomiting, diarrhea, constipation, bowel incontinence Gu:  Denies bladder incontinence, burning urine Ext:   No Joint pain, stiffness or swelling Skin: No skin rash, easy bruising or bleeding or hives Endoc:  No polyuria, polydipsia , polyphagia or weight change Psych: No depression, insomnia or hallucinations  Other:  All other systems negative  Physical Examination:   VS: BP 157/63 mmHg  Pulse 72  Temp(Src) 97.6 F (36.4 C) (Oral)  Resp 16  Ht 4\' 11"  (1.499 m)  Wt 143 lb 1.6 oz (64.91 kg)  BMI 28.89 kg/m2  SpO2 98%  General Appearance: No distress  Neuro/psych without focal findings, mental status, speech normal, alert and oriented, cranial nerves 2-12 intact, reflexes normal and symmetric, sensation grossly normal  HEENT: PERRLA, EOM intact, no ptosis, no other lesions noticed, Mallampati: Pulmonary:.No wheezing, No rales     Cardiovascular:  Normal S1,S2.  No m/r/g.  .    Abdomen:Benign, Soft, non-tender, No masses, hepatosplenomegaly, No lymphadenopathy Endoc: No evident thyromegaly, no signs of acromegaly or Cushing features Skin:   warm, no rashes, no ecchymosis  Extremities: normal, no cyanosis, clubbing, no edema, warm with normal capillary refill. Other findings:   Labs  results:   Recent Labs     05/18/15  2216  HGB  11.5*  HCT  34.4*  MCV  90.9  WBC  17.8*  BUN  38*  CREATININE  1.12*  GLUCOSE  180*  CALCIUM  9.2   CLINICAL DATA: Shortness of breath  EXAM: CHEST 2 VIEW  COMPARISON: 05/15/2015  FINDINGS: Chronic hyperinflation in this patient with COPD. Chronic cardiomegaly and aortic tortuosity which is stable. Extensive coronary atherosclerotic calcification is noted. There is no edema, consolidation, effusion, or pneumothorax. No acute osseous findings.  IMPRESSION: 1. Stable exam. No evidence of acute cardiopulmonary disease. 2. COPD and  chronic cardiomegaly.   Electronically Signed  By: Monte Fantasia M.D.  On: 05/18/2015 23:07  Assessment and Plan: Copd (moderate) here with exacerbation, slowly improving. Obstructive sleep apnea (adult) tolerating her her cpap well, cpap is here   Bronchiectasis, flare    Oxygen desaturation secondary to the above. On 0xygen, (inogen system) at home DOE secondary to the above as well as her weight  Plan: -continue anoro, albuterol, steroids -emperic antibiotics as ordered -cpap on home -dvt prophylaxis -flutter valve -if possible sputum culture -anticipate to rehab  I have personally obtained a history, examined the patient, evaluated laboratory and imaging results, formulated the assessment and plan and placed orders.  The Patient requires high complexity decision making for assessment and support, frequent evaluation and titration of therapies, application of advanced monitoring technologies and extensive interpretation of multiple databases.   Herbon Fleming,M.D. Pulmonary & Critical care Medicine [facility]

## 2015-05-21 NOTE — Care Management Important Message (Signed)
Important Message  Patient Details  Name: Marisa Hicks MRN: [id number] Date of Birth: [birth year]   Medicare Important Message Given:  Yes-second notification given    Juliann Pulse A [place] 05/21/2015, 10:39 AM

## 2015-05-21 NOTE — Progress Notes (Signed)
[place] [facility] [place] NAME: Marisa Hicks    MR#:  [id number]  DATE OF BIRTH:  [birth year]  SUBJECTIVE:  CHIEF COMPLAINT:   Chief Complaint  Patient presents with  . Shortness of Breath   slow improvement, gets dyspneic on minimal exertion, on 3 L nasal cannula oxygen, doing a lot of bruises all over her both arms.  Denies any injury REVIEW OF SYSTEMS:  Review of Systems  Constitutional: Negative for fever, weight loss, malaise/fatigue and diaphoresis.  HENT: Negative for ear discharge, ear pain, hearing loss, nosebleeds, sore throat and tinnitus.   Eyes: Negative for blurred vision and pain.  Respiratory: Positive for cough, sputum production, shortness of breath and wheezing. Negative for hemoptysis.   Cardiovascular: Negative for chest pain, palpitations, orthopnea and leg swelling.  Gastrointestinal: Negative for heartburn, nausea, vomiting, abdominal pain, diarrhea, constipation and blood in stool.  Genitourinary: Negative for dysuria, urgency and frequency.  Musculoskeletal: Negative for myalgias and back pain.  Skin: Negative for itching and rash.  Neurological: Negative for dizziness, tingling, tremors, focal weakness, seizures, weakness and headaches.  Psychiatric/Behavioral: Negative for depression. The patient is not nervous/anxious.    DRUG ALLERGIES:   Allergies  Allergen Reactions  . Ace Inhibitors   . Aliskiren-Hydrochlorothiazide Other (See Comments)  . Altace [Ramipril] Other (See Comments)    angioedema  . Atorvastatin Other (See Comments)  . Hydralazine Hcl   . Hydrochlorothiazide Other (See Comments)    hyponatremia  . Irbesartan-Hydrochlorothiazide Other (See Comments)  . Labetalol Hcl   . Rosuvastatin Other (See Comments)  . Tribenzor [Olmesartan-Amlodipine-Hctz] Other (See Comments)    Low sodium   VITALS:  Blood pressure 157/63, pulse 72, temperature 97.6 F (36.4 C), temperature source Oral, resp.  rate 16, height 4\' 11"  (1.499 m), weight 64.91 kg (143 lb 1.6 oz), SpO2 99 %. PHYSICAL EXAMINATION:  Physical Exam  Constitutional: She is oriented to person, place, and time and well-developed, well-nourished, and in no distress.  HENT:  Head: Normocephalic and atraumatic.  Eyes: Conjunctivae and EOM are normal. Pupils are equal, round, and reactive to light.  Neck: Normal range of motion. Neck supple. No tracheal deviation present. No thyromegaly present.  Cardiovascular: Normal rate, regular rhythm and normal heart sounds.   Pulmonary/Chest: She is in respiratory distress. She has wheezes. She has rales. She exhibits no tenderness.  Abdominal: Soft. Bowel sounds are normal. She exhibits no distension. There is no tenderness.  Musculoskeletal: Normal range of motion.  Neurological: She is alert and oriented to person, place, and time. No cranial nerve deficit.  Skin: Skin is warm and dry. Bruising (Elbows area and in the upper arm on the left) noted. No rash noted.  Psychiatric: Mood and affect normal.   LABORATORY PANEL:   CBC  Recent Labs Lab 05/18/15 2216  WBC 17.8*  HGB 11.5*  HCT 34.4*  PLT 268   ------------------------------------------------------------------------------------------------------------------ Chemistries   Recent Labs Lab 05/18/15 2216  NA 137  K 4.4  CL 104  CO2 23  GLUCOSE 180*  BUN 38*  CREATININE 1.12*  CALCIUM 9.2   ASSESSMENT AND PLAN:  81 year old Caucasian female admitted for acute on chronic respiratory failure.  1. Acute on chronic respiratory failure: Secondary to COPD: Continue IV steroids, antibiotics (changed to Doxycycline from Zithromax considering cardiology wanting to start sotalol) and breathing treatments as necessary. Continue inhaled corticosteroid.  Pulmo rehabilitation after discharge  2. Hypertension: continue carvedilol, amlodipine and Benicar 3. CAD: Stable, continue  aspirin.  Hold Plavix due to easy bruising, high  risk for bleeding 4. Congestive heart failure: Diastolic, chronic. Stable 5.  Supraventricular tachycardia: Cardiology following. started sotalol on 16th of August.  She is been in normal sinus rhythm now 6.  Easy Bruising: will stop plavix at this point, continue asa, her cardiac stenting was in 2004.  7. DVT prophylaxis: Heparin 8. GI prophylaxis: None   All the records are reviewed and case discussed with Care Management/Social Worker. Management plans discussed with the patient, family and they are in agreement.  CODE STATUS: Full code  TOTAL TIME TAKING CARE OF THIS PATIENT: 35 minutes.   More than 50% of the time was spent in counseling/coordination of care: YES  POSSIBLE D/C IN 1-2 DAYS, DEPENDING ON CLINICAL CONDITION.   [facility], Rossie Bretado M.D on 05/21/2015 at 3:36 PM  Between 7am to 6pm - Pager - [phone]  After 6pm go to [email or web address] - password [email or web address] [facility]  Office  [phone]  CC: Primary care physician; Enid Derry, MD

## 2015-05-22 ENCOUNTER — Inpatient Hospital Stay: Payer: Medicare Other

## 2015-05-22 MED ORDER — CLONIDINE HCL 0.1 MG PO TABS
0.2000 mg | ORAL_TABLET | Freq: Two times a day (BID) | ORAL | Status: DC
  Administered 2015-05-22: 0.2 mg via ORAL
  Filled 2015-05-22 (×2): qty 2

## 2015-05-22 MED ORDER — METHYLPREDNISOLONE SODIUM SUCC 125 MG IJ SOLR
60.0000 mg | Freq: Two times a day (BID) | INTRAMUSCULAR | Status: DC
  Administered 2015-05-23: 60 mg via INTRAVENOUS
  Filled 2015-05-22: qty 2

## 2015-05-22 MED ORDER — CLONIDINE HCL 0.1 MG PO TABS
0.2000 mg | ORAL_TABLET | Freq: Two times a day (BID) | ORAL | Status: DC
  Administered 2015-05-23: 0.2 mg via ORAL
  Filled 2015-05-22: qty 2

## 2015-05-22 MED ORDER — CLONIDINE HCL 0.1 MG PO TABS: 0.1000 mg | ORAL_TABLET | Freq: Two times a day (BID) | ORAL | Status: DC

## 2015-05-22 MED ORDER — CLONIDINE HCL 0.1 MG PO TABS
0.1000 mg | ORAL_TABLET | Freq: Once | ORAL | Status: AC
  Administered 2015-05-22: 0.1 mg via ORAL

## 2015-05-22 NOTE — Progress Notes (Signed)
Physical Therapy Treatment Patient Details Name: OWEN PAGNOTTA MRN: [id number] DOB: 04/23/1922 Today's Date: 05/22/2015    History of Present Illness Pt is a 81 y.o. female presenting to hospital with SOB and cough worsening last 5 days and admitted with acute on chronic respiratory failure secondary to COPD; pt also noted to have supraventricular tachycardia.    PT Comments    Pt eager to walk. Pt demonstrates ambulation with mild unsteadiness noted subjectively and 1 LOB episode with initial steps requiring Min A. Pt would benefit from rolling walker for more safe ambulation initially. Good performance with LE exercises. Pt comfortable up in chair post session.   Follow Up Recommendations  Home health PT     Equipment Recommendations  None recommended by PT    Recommendations for Other Services       Precautions / Restrictions Precautions Precautions: Fall Restrictions Weight Bearing Restrictions: No    Mobility  Bed Mobility Overal bed mobility: Modified Independent Bed Mobility: Supine to Sit     Supine to sit: Modified independent (Device/Increase time)        Transfers Overall transfer level: Needs assistance Equipment used: Straight cane Transfers: Sit to/from Stand Sit to Stand: Min guard            Ambulation/Gait Ambulation/Gait assistance: Min guard (1 LOB episode requiring Min A to correct) Ambulation Distance (Feet): 170 Feet Assistive device: Straight cane Gait Pattern/deviations: Step-through pattern;WFL(Within Functional Limits) (subjectively pt feels mildly unsteady)   Gait velocity interpretation: at or above normal speed for age/gender General Gait Details: Wheezing, audible SOB, O2 sats steady at 97-98%   Stairs            Wheelchair Mobility    Modified Rankin (Stroke Patients Only)       Balance   Sitting-balance support: No upper extremity supported Sitting balance-Leahy Scale: Normal     Standing balance support:  Single extremity supported Standing balance-Leahy Scale: Good                      Cognition Arousal/Alertness: Awake/alert Behavior During Therapy: WFL for tasks assessed/performed Overall Cognitive Status: Within Functional Limits for tasks assessed                      Exercises General Exercises - Lower Extremity Ankle Circles/Pumps: AROM;Both;20 reps;Seated Quad Sets: Strengthening;Both;20 reps;Seated Gluteal Sets: Strengthening;Both;20 reps;Seated Long Arc Quad: AROM;Both;20 reps;Seated Heel Slides: AROM;Both;20 reps;Seated Hip ABduction/ADduction: AROM;Both;20 reps;Seated Hip Flexion/Marching: AROM;Both;20 reps;Seated Toe Raises: AROM;Both;20 reps;Seated Heel Raises: AROM;Both;20 reps;Seated    General Comments        Pertinent Vitals/Pain Pain Assessment: No/denies pain    Home Living                      Prior Function            PT Goals (current goals can now be found in the care plan section) Progress towards PT goals: Progressing toward goals    Frequency  Min 2X/week    PT Plan Current plan remains appropriate    Co-evaluation             End of Session Equipment Utilized During Treatment: Gait belt;Oxygen Activity Tolerance: Patient tolerated treatment well;Patient limited by fatigue (SOB) Patient left: in chair;with chair alarm set;with call bell/phone within reach     Time: 1313-1346 PT Time Calculation (min) (ACUTE ONLY): 33 min  Charges:  $Gait Training: 8-22 mins $Therapeutic Exercise:  8-22 mins                    G Codes:      Charlaine Dalton 05/22/2015, 2:46 PM

## 2015-05-22 NOTE — Progress Notes (Signed)
[facility] [place] NAME: Marisa Hicks    MR#:  [id number]  DATE OF BIRTH:  01-20-1922  SUBJECTIVE:  Feels better today. No wheezing. weak  REVIEW OF SYSTEMS:   Review of Systems  Constitutional: Negative for fever, chills and weight loss.  HENT: Negative for ear discharge, ear pain and nosebleeds.   Eyes: Negative for blurred vision, pain and discharge.  Respiratory: Positive for cough and shortness of breath. Negative for sputum production, wheezing and stridor.   Cardiovascular: Negative for chest pain, palpitations, orthopnea and PND.  Gastrointestinal: Negative for nausea, vomiting, abdominal pain and diarrhea.  Genitourinary: Negative for urgency and frequency.  Musculoskeletal: Negative for back pain and joint pain.  Neurological: Positive for weakness. Negative for sensory change, speech change and focal weakness.  Psychiatric/Behavioral: Negative for depression and hallucinations. The patient is not nervous/anxious.   All other systems reviewed and are negative.  Tolerating Diet:yes Tolerating PT: HHPT  DRUG ALLERGIES:   Allergies  Allergen Reactions  . Ace Inhibitors   . Aliskiren-Hydrochlorothiazide Other (See Comments)  . Altace [Ramipril] Other (See Comments)    angioedema  . Atorvastatin Other (See Comments)  . Hydralazine Hcl   . Hydrochlorothiazide Other (See Comments)    hyponatremia  . Irbesartan-Hydrochlorothiazide Other (See Comments)  . Labetalol Hcl   . Rosuvastatin Other (See Comments)  . Tribenzor [Olmesartan-Amlodipine-Hctz] Other (See Comments)    Low sodium    VITALS:  Blood pressure 176/67, pulse 72, temperature 98.4 F (36.9 C), temperature source Oral, resp. rate 16, height 4\' 11"  (1.499 m), weight 65.817 kg (145 lb 1.6 oz), SpO2 94 %.  PHYSICAL EXAMINATION:   Physical Exam  GENERAL:  81 y.o.-year-old patient lying in the bed with no acute distress.  EYES: Pupils equal, round,  reactive to light and accommodation. No scleral icterus. Extraocular muscles intact.  HEENT: Head atraumatic, normocephalic. Oropharynx and nasopharynx clear.  NECK:  Supple, no jugular venous distention. No thyroid enlargement, no tenderness.  LUNGS: decreased breath sounds bilaterally, no wheezing, rales, rhonchi. No use of accessory muscles of respiration.  CARDIOVASCULAR: S1, S2 normal. No murmurs, rubs, or gallops.  ABDOMEN: Soft, nontender, nondistended. Bowel sounds present. No organomegaly or mass.  EXTREMITIES: No cyanosis, clubbing or edema b/l.    NEUROLOGIC: Cranial nerves II through XII are intact. No focal Motor or sensory deficits b/l.   PSYCHIATRIC:  patient is alert and oriented x 3.  SKIN: No obvious rash, lesion, or ulcer.    LABORATORY PANEL:   CBC  Recent Labs Lab 05/18/15 2216  WBC 17.8*  HGB 11.5*  HCT 34.4*  PLT 268    Chemistries   Recent Labs Lab 05/18/15 2216  NA 137  K 4.4  CL 104  CO2 23  GLUCOSE 180*  BUN 38*  CREATININE 1.12*  CALCIUM 9.2    Cardiac Enzymes  Recent Labs Lab 05/18/15 2216  TROPONINI <0.03    RADIOLOGY:  No results found.   ASSESSMENT AND PLAN:   81 year old Caucasian female admitted for acute on chronic respiratory failure.  1. Acute on chronic respiratory failure: Secondary to COPD: Continue IV steroids, antibiotics (changed to Doxycycline from Zithromax considering cardiology wanting to start sotalol) and breathing treatments as necessary. Continue inhaled corticosteroid. Pulmo rehabilitation after discharge -improving slowly. Stat tapering steroids E.evated WBC likely from steroid effect too  2. Hypertension: continue carvedilol, amlodipine and Benicar  3. CAD: Stable, continue aspirin. Hold Plavix due to easy bruising, high risk  for bleeding  4. Congestive heart failure: Diastolic, chronic. Stable  5. Supraventricular tachycardia: Cardiology following. started sotalol on 16th of August. She is  been in normal sinus rhythm now  6. Easy Bruising: will stop plavix at this point, continue asa, her cardiac stenting was in 2004.   7. DVT prophylaxis: Heparin  8. GI prophylaxis: None   D/c home i am with HHPT. CM for d/c planing Spoke with dter in room  Case discussed with Care Management/Social Worker. Management plans discussed with the patient, family and they are in agreement.  CODE STATUS: Full  DVT Prophylaxis: lovenox  TOTAL TIME TAKING CARE OF THIS PATIENT: 40 minutes.  >50% time spent on counselling and coordination of care  POSSIBLE D/C IN 1 DAYS, DEPENDING ON CLINICAL CONDITION.   Kalana Yust M.D on 05/22/2015 at 12:29 PM  Between 7am to 6pm - Pager - [phone]  After 6pm go to [email or web address] - password [email or web address] [facility]  Office  [phone]  CC: Primary care physician; Enid Derry, MD

## 2015-05-22 NOTE — Progress Notes (Signed)
Pt placed on home CPAP. 3L O2 inline.

## 2015-05-22 NOTE — Progress Notes (Signed)
   SUBJECTIVE: no further episodes of palpitations or tachycardia. SOB stable.    Filed Vitals:   05/21/15 2129 05/22/15 0019 05/22/15 0456 05/22/15 0758  BP: 146/63 169/80  176/67  Pulse: 73 74  72  Temp:  97.6 F (36.4 C)  98.4 F (36.9 C)  TempSrc:  Oral  Oral  Resp:    16  Height:      Weight:   65.817 kg (145 lb 1.6 oz)   SpO2:  98%  93%    Intake/Output Summary (Last 24 hours) at 05/22/15 0851 Last data filed at 05/22/15 0550  Gross per 24 hour  Intake 2268.7 ml  Output   1900 ml  Net  368.7 ml    LABS: Basic Metabolic Panel: No results for input(s): NA, K, CL, CO2, GLUCOSE, BUN, CREATININE, CALCIUM, MG, PHOS in the last 72 hours. Liver Function Tests: No results for input(s): AST, ALT, ALKPHOS, BILITOT, PROT, ALBUMIN in the last 72 hours. No results for input(s): LIPASE, AMYLASE in the last 72 hours. CBC: No results for input(s): WBC, NEUTROABS, HGB, HCT, MCV, PLT in the last 72 hours. Cardiac Enzymes: No results for input(s): CKTOTAL, CKMB, CKMBINDEX, TROPONINI in the last 72 hours. BNP: Invalid input(s): POCBNP D-Dimer: No results for input(s): DDIMER in the last 72 hours. Hemoglobin A1C: No results for input(s): HGBA1C in the last 72 hours. Fasting Lipid Panel: No results for input(s): CHOL, HDL, LDLCALC, TRIG, CHOLHDL, LDLDIRECT in the last 72 hours. Thyroid Function Tests: No results for input(s): TSH, T4TOTAL, T3FREE, THYROIDAB in the last 72 hours.  Invalid input(s): FREET3 Anemia Panel: No results for input(s): VITAMINB12, FOLATE, FERRITIN, TIBC, IRON, RETICCTPCT in the last 72 hours.   PHYSICAL EXAM General: Well developed, well nourished, in mild acute distress HEENT:  Normocephalic and atramatic Neck:  No JVD.  Lungs: Clear bilaterally to auscultation and percussion. Heart: HRRR . Normal S1 and S2 without gallops or murmurs.  Abdomen: Bowel sounds are positive, abdomen soft and non-tender  Msk:  Back normal, normal gait. Normal strength and  tone for age. Extremities: No clubbing, cyanosis or edema.   Neuro: Alert and oriented X 3. Psych:  Good affect, responds appropriately  TELEMETRY: Reviewed telemetry pt in NSR:  ASSESSMENT AND PLAN: no further episodes of SVT.QTc is wnl (407). Continue home benicar for BP control    Patient and plan discussed with supervising provider, Dr. Neoma Laming, who agrees with above findings.   Kelby Fam [place], [place]  05/22/2015 8:51 AM

## 2015-05-22 NOTE — Care Management (Signed)
Spoke with the patient and daughter(Paula).  Daughter stated that she will be staying with the patient until she is better. Patient does not have walker at home. Will need order for DME rolling walker. Choice of home health providers given and patient and daughter agreed upon [facility]. Referrals placed with Floyd[facility] Fl[facility]. Anticipate discharge tomorrow with Home Health . Pateint has home o2 provided by [facility].

## 2015-05-23 LAB — PLATELET COUNT: PLATELETS: 343 10*3/uL (ref 150–440)

## 2015-05-23 MED ORDER — FUROSEMIDE 10 MG/ML IJ SOLN
20.0000 mg | Freq: Once | INTRAMUSCULAR | Status: AC
  Administered 2015-05-23: 20 mg via INTRAVENOUS
  Filled 2015-05-23: qty 2

## 2015-05-23 MED ORDER — SOTALOL HCL 80 MG PO TABS: 80.0000 mg | ORAL_TABLET | Freq: Two times a day (BID) | ORAL | Status: AC

## 2015-05-23 MED ORDER — PREDNISONE 10 MG PO TABS: 60.0000 mg | ORAL_TABLET | Freq: Every day | ORAL | Status: DC

## 2015-05-23 MED ORDER — IPRATROPIUM-ALBUTEROL 0.5-2.5 (3) MG/3ML IN SOLN: 3.0000 mL | Freq: Once | RESPIRATORY_TRACT | Status: DC

## 2015-05-23 MED ORDER — DOXYCYCLINE HYCLATE 100 MG PO TABS: 100.0000 mg | ORAL_TABLET | Freq: Two times a day (BID) | ORAL | Status: DC

## 2015-05-23 NOTE — Discharge Instructions (Signed)
Use your oxygen and inhalers as before  If you experience any shortness of breath, trouble breathing, dizziness, confusion, or any other symptoms that are of concern to you or that are bothersome to you, call your doctor. For all questions and/or concerns, call your doctor.   Take all medications as prescribed. Keep all follow up appointments.    Acute Respiratory Failure Respiratory failure is when your lungs are not working well and your breathing (respiratory) system fails. When respiratory failure occurs, it is difficult for your lungs to get enough oxygen, get rid of carbon dioxide, or both. Respiratory failure can be life threatening.  Respiratory failure can be acute or chronic. Acute respiratory failure is sudden, severe, and requires emergency medical treatment. Chronic respiratory failure is less severe, happens over time, and requires ongoing treatment.  WHAT ARE THE CAUSES OF ACUTE RESPIRATORY FAILURE?  Any problem affecting the heart or lungs can cause acute respiratory failure. Some of these causes include the following:  Chronic bronchitis and emphysema (COPD).   Blood clot going to a lung (pulmonary embolism).   Having water in the lungs caused by heart failure, lung injury, or infection (pulmonary edema).   Collapsed lung (pneumothorax).   Pneumonia.   Pulmonary fibrosis.   Obesity.   Asthma.   Heart failure.   Any type of trauma to the chest that can make breathing difficult.   Nerve or muscle diseases making chest movements difficult. WHAT SYMPTOMS SHOULD YOU WATCH FOR?  If you have any of these signs or symptoms, you should seek immediate medical care:   You have shortness of breath (dyspnea) with or without activity.   You have rapid, fast breathing (tachypnea).   You are wheezing.  You are unable to say more than a few words without having to catch your breath.  You find it very difficult to function normally.  You have a fast heart  rate.   You have a bluish color to your finger or toe nail beds.   You have confusion or drowsiness or both.  HOW WILL MY ACUTE RESPIRATORY FAILURE BE TREATED?  Treatment of acute respiratory failure depends on the cause of the respiratory failure. Usually, you will stay in the intensive care unit so your breathing can be watched closely. Treatment can include the following:  Oxygen. Oxygen can be delivered through the following:  Nasal cannula. This is small tubing that goes in your nose to give you oxygen.  Face mask. A face mask covers your nose and mouth to give you oxygen.  Medicine. Different medicines can be given to help with breathing. These can include:  Nebulizers. Nebulizers deliver medicines to open the air passages (bronchodilators). These medicines help to open or relax the airways in the lungs so you can breathe better. They can also help loosen mucus from your lungs.  Diuretics. Diuretic medicines can help you breathe better by getting rid of extra water in your body.  Steroids. Steroid medicines can help decrease swelling (inflammation) in your lungs.  Antibiotics.  Chest tube. If you have a collapsed lung (pneumothorax), a chest tube is placed to help reinflate the lung.  Non-invasive positive pressure ventilation (NPPV). This is a tight-fitting mask that goes over your nose and mouth. The mask has tubing that is attached to a machine. The machine blows air into the tubing, which helps to keep the tiny air sacs (alveoli) in your lungs open. This machine allows you to breathe on your own.  Ventilator. A ventilator is  a breathing machine. When on a ventilator, a breathing tube is put into the lungs. A ventilator is used when you can no longer breathe well enough on your own. You may have low oxygen levels or high carbon dioxide (CO2) levels in your blood. When you are on a ventilator, sedation and pain medicines are given to make you sleep so your lungs can  heal. Document Released: 09/25/2013 Document Revised: 02/04/2014 Document Reviewed: 09/25/2013 [facility] Patient Information 2015 [place], [place]. This information is not intended to replace advice given to you by your health care provider. Make sure you discuss any questions you have with your health care provider.

## 2015-05-23 NOTE — Care Management Important Message (Signed)
Important Message  Patient Details  Name: Marisa Hicks MRN: [id number] Date of Birth: 1922-01-04   Medicare Important Message Given:  Yes-third notification given    Juliann Pulse A Allmond 05/23/2015, 9:33 AM

## 2015-05-23 NOTE — Progress Notes (Signed)
Pt d/c home; d/c instructions reviewed w/ pt; pt understanding was verbalized; IV removed catheter in tact, gauze dressing applied; all pt questions answered; pt left unit via wheelchair accompanied by staff 

## 2015-05-23 NOTE — Progress Notes (Signed)
SUBJECTIVE: Pt feeling better, SOB improved   Filed Vitals:   05/22/15 1939 05/22/15 2134 05/23/15 0001 05/23/15 0745  BP:  154/55 162/83 176/83  Pulse:   59 73  Temp:   97.5 F (36.4 C) 97.6 F (36.4 C)  TempSrc:   Oral Oral  Resp:   17 20  Height:      Weight:      SpO2: 98%  100% 100%    Intake/Output Summary (Last 24 hours) at 05/23/15 0905 Last data filed at 05/23/15 0746  Gross per 24 hour  Intake      0 ml  Output   2800 ml  Net  -2800 ml    LABS: Basic Metabolic Panel: No results for input(s): NA, K, CL, CO2, GLUCOSE, BUN, CREATININE, CALCIUM, MG, PHOS in the last 72 hours. Liver Function Tests: No results for input(s): AST, ALT, ALKPHOS, BILITOT, PROT, ALBUMIN in the last 72 hours. No results for input(s): LIPASE, AMYLASE in the last 72 hours. CBC:  Recent Labs  05/23/15 0335  PLT 343   Cardiac Enzymes: No results for input(s): CKTOTAL, CKMB, CKMBINDEX, TROPONINI in the last 72 hours. BNP: Invalid input(s): POCBNP D-Dimer: No results for input(s): DDIMER in the last 72 hours. Hemoglobin A1C: No results for input(s): HGBA1C in the last 72 hours. Fasting Lipid Panel: No results for input(s): CHOL, HDL, LDLCALC, TRIG, CHOLHDL, LDLDIRECT in the last 72 hours. Thyroid Function Tests: No results for input(s): TSH, T4TOTAL, T3FREE, THYROIDAB in the last 72 hours.  Invalid input(s): FREET3 Anemia Panel: No results for input(s): VITAMINB12, FOLATE, FERRITIN, TIBC, IRON, RETICCTPCT in the last 72 hours.   PHYSICAL EXAM General: Well developed, well nourished, in no acute distress HEENT:  Normocephalic and atramatic Neck:  No JVD.  Lungs: Clear bilaterally to auscultation and percussion. Heart: HRRR . Normal S1 and S2 without gallops or murmurs.  Abdomen: Bowel sounds are positive, abdomen soft and non-tender  Msk:  Back normal, normal gait. Normal strength and tone for age. Extremities: No clubbing, cyanosis or edema.   Neuro: Alert and oriented X  3. Psych:  Good affect, responds appropriately  TELEMETRY: NSR  ASSESSMENT AND PLAN: Ok from cards standpoint to d/c. Pt given f/u Tuesday 8/23 at 2pm. Continue sotalol, amlodipine, benicar. Will plan to increase clonidine to 0.2 TID at f/u.   Active Problems:   Acute on chronic respiratory failure    Nolon Yellin A, MD, [facility] 05/23/2015 9:05 AM

## 2015-05-23 NOTE — Discharge Summary (Signed)
[facility] NAME: Marisa Hicks    MR#:  [id number]  DATE OF BIRTH:  June 02, 1922  DATE OF ADMISSION:  05/18/2015 ADMITTING PHYSICIAN: Harrie Foreman, MD  DATE OF DISCHARGE:   PRIMARY CARE PHYSICIAN: Enid Derry, MD    ADMISSION DIAGNOSIS:  Bronchitis [J40] Chronic obstructive pulmonary disease with acute exacerbation [J44.1]  DISCHARGE DIAGNOSIS:  Acute on chronic respiratory failure due to COPD flare Chronic home oxygen use HTN SVT-now in NSR SECONDARY DIAGNOSIS:   Past Medical History  Diagnosis Date  . Renal disorder   . Arthritis   . Osteoarthritis   . CHF (congestive heart failure)   . Dyspnea   . CAD (coronary artery disease)   . COPD (chronic obstructive pulmonary disease)   . Hypertension   . Hyperlipidemia   . Anxiety   . Allergy   . Osteoporosis   . Proteinuria   . Lymphocytosis   . Dysphagia   . Monocytosis   . Hyponatremia   . Pulmonary hypertension   . Left ventricular hypertrophy   . Hypertriglyceridemia     HOSPITAL COURSE:  81 year old Caucasian female admitted for acute on chronic respiratory failure.  1. Acute on chronic respiratory failure: Secondary to COPD:  -pt received IV steroids, antibiotics (changed to Doxycycline from Zithromax considering cardiology wanting to start sotalol) and breathing treatments as necessary. Continue inhaled corticosteroid. Pulmo rehabilitation after discharge -improving slowly.tapering steroids now Elevated WBC likely from steroid effect too. -sats 100% on 3 liter  2. Hypertension: continue carvedilol,clonidine, amlodipine and Benicar  3. CAD: Stable, continue aspirin. Hold Plavix due to easy bruising, high risk for bleeding  4. Congestive heart failure: Diastolic, chronic. Stable  5. Supraventricular tachycardia: Cardiology following. started sotalol on 16th of August. She is been in normal sinus rhythm now  6. Easy Bruising: will stop  plavix at this point, continue asa, her cardiac stenting was in 2004.   7. DVT prophylaxis: Heparin  8. GI prophylaxis: None Overall stable PT recommends HHPT/RN D/w dter D/c home DISCHARGE CONDITIONS:   fair  CONSULTS OBTAINED:  Treatment Team:  Dionisio David, MD Erby Pian, MD  DRUG ALLERGIES:   Allergies  Allergen Reactions  . Ace Inhibitors   . Aliskiren-Hydrochlorothiazide Other (See Comments)  . Altace [Ramipril] Other (See Comments)    angioedema  . Atorvastatin Other (See Comments)  . Hydralazine Hcl   . Hydrochlorothiazide Other (See Comments)    hyponatremia  . Irbesartan-Hydrochlorothiazide Other (See Comments)  . Labetalol Hcl   . Rosuvastatin Other (See Comments)  . Tribenzor [Olmesartan-Amlodipine-Hctz] Other (See Comments)    Low sodium    DISCHARGE MEDICATIONS:   Current Discharge Medication List    START taking these medications   Details  !! doxycycline (VIBRA-TABS) 100 MG tablet Take 1 tablet (100 mg total) by mouth every 12 (twelve) hours. Qty: 6 tablet, Refills: 0    sotalol (BETAPACE) 80 MG tablet Take 1 tablet (80 mg total) by mouth every 12 (twelve) hours. Qty: 60 tablet, Refills: 0     !! - Potential duplicate medications found. Please discuss with provider.    CONTINUE these medications which have CHANGED   Details  predniSONE (DELTASONE) 10 MG tablet Take 6 tablets (60 mg total) by mouth daily with breakfast. Label and dispense accordingly Day 1 6 tabs Day 2 5 tabs Day 3 4 tabs Day 5 3 tabs Day 6 2 tabs Day 7 1 tab Than stop Qty: 21  tablet, Refills: 0      CONTINUE these medications which have NOT CHANGED   Details  acidophilus (RISAQUAD) CAPS capsule Take 1 capsule by mouth daily.    albuterol (PROVENTIL) (2.5 MG/3ML) 0.083% nebulizer solution Take 3 mLs (2.5 mg total) by nebulization every 4 (four) hours as needed for wheezing or shortness of breath. Do not use with your rescue inhaler; just one or the other Qty:  50 mL, Refills: 1    ALPRAZolam (XANAX) 0.25 MG tablet Take 0.25 mg by mouth 2 (two) times daily.    amLODipine (NORVASC) 10 MG tablet Take 10 mg by mouth daily.    aspirin EC 81 MG tablet Take 81 mg by mouth daily.    calcium-vitamin D (OSCAL WITH D) 500-200 MG-UNIT per tablet Take 1 tablet by mouth 2 (two) times daily.    carvedilol (COREG) 12.5 MG tablet Take 12.5 mg by mouth 2 (two) times daily.    chlorpheniramine-HYDROcodone (TUSSIONEX PENNKINETIC ER) 10-8 MG/5ML SUER Take 5 mLs by mouth every 12 (twelve) hours as needed for cough. Qty: 115 mL, Refills: 0    cholecalciferol (VITAMIN D) 1000 UNITS tablet Take 2,000 Units by mouth daily.     clopidogrel (PLAVIX) 75 MG tablet Take 75 mg by mouth daily.    !! doxycycline (VIBRA-TABS) 100 MG tablet Take 1 tablet (100 mg total) by mouth 2 (two) times daily. Qty: 20 tablet, Refills: 0    fexofenadine (ALLEGRA) 180 MG tablet Take 180 mg by mouth daily.    mometasone (NASONEX) 50 MCG/ACT nasal spray Place 2 sprays into the nose daily.    Multiple Vitamins-Minerals (MULTIVITAMIN WITH MINERALS) tablet Take 1 tablet by mouth daily.    olmesartan (BENICAR) 40 MG tablet Take 40 mg by mouth daily.    Omega-3 Fatty Acids (FISH OIL) 1000 MG CAPS Take by mouth daily.     raloxifene (EVISTA) 60 MG tablet Take 60 mg by mouth daily.    simvastatin (ZOCOR) 40 MG tablet Take 40 mg by mouth daily.    Umeclidinium-Vilanterol (ANORO ELLIPTA) 62.5-25 MCG/INH AEPB Inhale 1 puff into the lungs daily.     !! - Potential duplicate medications found. Please discuss with provider.    STOP taking these medications     meclizine (ANTIVERT) 25 MG tablet      PROVENTIL HFA 108 (90 BASE) MCG/ACT inhaler         If you experience worsening of your admission symptoms, develop shortness of breath, life threatening emergency, suicidal or homicidal thoughts you must seek medical attention immediately by calling 911 or calling your MD immediately  if  symptoms less severe.  You Must read complete instructions/literature along with all the possible adverse reactions/side effects for all the Medicines you take and that have been prescribed to you. Take any new Medicines after you have completely understood and accept all the possible adverse reactions/side effects.   Please note  You were cared for by a hospitalist during your hospital stay. If you have any questions about your discharge medications or the care you received while you were in the hospital after you are discharged, you can call the unit and asked to speak with the hospitalist on call if the hospitalist that took care of you is not available. Once you are discharged, your primary care physician will handle any further medical issues. Please note that NO REFILLS for any discharge medications will be authorized once you are discharged, as it is imperative that you return to your  primary care physician (or establish a relationship with a primary care physician if you do not have one) for your aftercare needs so that they can reassess your need for medications and monitor your lab values. Today   SUBJECTIVE  Feels ok. Slow improvment   VITAL SIGNS:  Blood pressure 176/83, pulse 73, temperature 97.6 F (36.4 C), temperature source Oral, resp. rate 20, height 4\' 11"  (1.499 m), weight 65.817 kg (145 lb 1.6 oz), SpO2 100 %.  I/O:   Intake/Output Summary (Last 24 hours) at 05/23/15 0850 Last data filed at 05/23/15 0746  Gross per 24 hour  Intake      0 ml  Output   2800 ml  Net  -2800 ml    PHYSICAL EXAMINATION:  GENERAL:  81 y.o.-year-old patient lying in the bed with no acute distress.  EYES: Pupils equal, round, reactive to light and accommodation. No scleral icterus. Extraocular muscles intact.  HEENT: Head atraumatic, normocephalic. Oropharynx and nasopharynx clear.  NECK:  Supple, no jugular venous distention. No thyroid enlargement, no tenderness.  LUNGS: decreased breath  sounds bilaterally, no wheezing, rales,rhonchi or crepitation. No use of accessory muscles of respiration.  CARDIOVASCULAR: S1, S2 normal. No murmurs, rubs, or gallops.  ABDOMEN: Soft, non-tender, non-distended. Bowel sounds present. No organomegaly or mass.  EXTREMITIES: No pedal edema, cyanosis, or clubbing.  NEUROLOGIC: Cranial nerves II through XII are intact. Muscle strength 5/5 in all extremities. Sensation intact. Gait not checked.  PSYCHIATRIC: The patient is alert and oriented x 3.  SKIN: No obvious rash, lesion, or ulcer.   DATA REVIEW:   CBC   Recent Labs Lab 05/18/15 2216 05/23/15 0335  WBC 17.8*  --   HGB 11.5*  --   HCT 34.4*  --   PLT 268 343    Chemistries   Recent Labs Lab 05/18/15 2216  NA 137  K 4.4  CL 104  CO2 23  GLUCOSE 180*  BUN 38*  CREATININE 1.12*  CALCIUM 9.2    Microbiology Results   No results found for this or any previous visit (from the past 240 hour(s)).  RADIOLOGY:  Dg Chest 1 View  05/22/2015   CLINICAL DATA:  Wheezing.  EXAM: CHEST  1 VIEW  COMPARISON:  05/18/2015  FINDINGS: Chronic interstitial coarsening and hyperinflation. Stable appearance of mildly prominent hilar vessels. Chronic cardiomegaly. There is no edema, consolidation, effusion, or pneumothorax.  IMPRESSION: Stable exam.  No evidence of acute cardiopulmonary disease.  COPD.   Electronically Signed   By: Monte Fantasia M.D.   On: 05/22/2015 23:44     Management plans discussed with the patient, family and they are in agreement.  CODE STATUS:     Code Status Orders        Start     Ordered   05/19/15 0624  Full code   Continuous     05/19/15 0623      TOTAL TIME TAKING CARE OF THIS PATIENT: 40 minutes.    Claborn Janusz M.D on 05/23/2015 at 8:50 AM  Between 7am to 6pm - Pager - [phone] After 6pm go to [email or web address] - password [email or web address] [facility]  Office  [phone]  CC: Primary care physician; Enid Derry, MD

## 2015-05-25 DIAGNOSIS — F419 Anxiety disorder, unspecified: Secondary | ICD-10-CM | POA: Diagnosis not present

## 2015-05-25 DIAGNOSIS — J40 Bronchitis, not specified as acute or chronic: Secondary | ICD-10-CM | POA: Diagnosis not present

## 2015-05-25 DIAGNOSIS — I2510 Atherosclerotic heart disease of native coronary artery without angina pectoris: Secondary | ICD-10-CM | POA: Diagnosis not present

## 2015-05-25 DIAGNOSIS — J441 Chronic obstructive pulmonary disease with (acute) exacerbation: Secondary | ICD-10-CM | POA: Diagnosis not present

## 2015-05-25 DIAGNOSIS — I10 Essential (primary) hypertension: Secondary | ICD-10-CM | POA: Diagnosis not present

## 2015-05-25 DIAGNOSIS — M810 Age-related osteoporosis without current pathological fracture: Secondary | ICD-10-CM | POA: Diagnosis not present

## 2015-05-25 DIAGNOSIS — I5032 Chronic diastolic (congestive) heart failure: Secondary | ICD-10-CM | POA: Diagnosis not present

## 2015-05-25 DIAGNOSIS — Z9981 Dependence on supplemental oxygen: Secondary | ICD-10-CM | POA: Diagnosis not present

## 2015-05-27 ENCOUNTER — Other Ambulatory Visit: Payer: Self-pay | Admitting: *Deleted

## 2015-05-27 NOTE — Patient Outreach (Signed)
Marisa Hicks) Care Management  05/27/2015  Marisa Hicks [birth year] [id number]  EMMI-COPD referral: Dashboard shows red for smoked or been around smoke-yes. Telephone call to patient who was advised of reason for call.  Patient voices that she has not been exposed to cigarette smoking for years & that she has only smoked 1 pack of cigarettes in her lifetime.  States she currently uses oxygen at 3 liters when active during the day and uses CPAP at night. States she has follow up scheduled to see primary care doctor on August 29th; and plans to call pulmonary doctor and heart doctor to set up follow up appointments since hospital stay. States her daughter takes her to doctors' appointments. Has no trouble getting prescriptions filled and is compliant with taking medications as prescribed by her doctors.  Patient she is very aware of COPD action plan and knows what steps to take if she has problems with her COPD. Currently someone is staying with her while she recuperates. States home health services were ordered for her before discharging from hospital. States she does have their contact information if needed.   Patient voices no concerns or health needs at this time. EMMI-dashboard has been addressed. Plan-will close out case.  Marisa Daisy, RN BSN [place] Management Coordinator [facility] Care Management  [phone]

## 2015-05-28 DIAGNOSIS — I2510 Atherosclerotic heart disease of native coronary artery without angina pectoris: Secondary | ICD-10-CM | POA: Diagnosis not present

## 2015-05-28 DIAGNOSIS — I10 Essential (primary) hypertension: Secondary | ICD-10-CM | POA: Diagnosis not present

## 2015-05-28 DIAGNOSIS — F419 Anxiety disorder, unspecified: Secondary | ICD-10-CM | POA: Diagnosis not present

## 2015-05-28 DIAGNOSIS — I5032 Chronic diastolic (congestive) heart failure: Secondary | ICD-10-CM | POA: Diagnosis not present

## 2015-05-28 DIAGNOSIS — J40 Bronchitis, not specified as acute or chronic: Secondary | ICD-10-CM | POA: Diagnosis not present

## 2015-05-28 DIAGNOSIS — J441 Chronic obstructive pulmonary disease with (acute) exacerbation: Secondary | ICD-10-CM | POA: Diagnosis not present

## 2015-05-28 NOTE — Patient Outreach (Signed)
[facility]) Care Management  05/28/2015  Marisa Hicks October 04, 1922 [id number]   Notification received from Sherrin Daisy, [facility] to close case due to consumer being assessed, no further interventions required.   Alfonso Shackett L. Corley Maffeo, [place] Care Management Assistant

## 2015-05-30 DIAGNOSIS — I2510 Atherosclerotic heart disease of native coronary artery without angina pectoris: Secondary | ICD-10-CM | POA: Diagnosis not present

## 2015-05-30 DIAGNOSIS — J40 Bronchitis, not specified as acute or chronic: Secondary | ICD-10-CM | POA: Diagnosis not present

## 2015-05-30 DIAGNOSIS — J441 Chronic obstructive pulmonary disease with (acute) exacerbation: Secondary | ICD-10-CM | POA: Diagnosis not present

## 2015-05-30 DIAGNOSIS — F419 Anxiety disorder, unspecified: Secondary | ICD-10-CM | POA: Diagnosis not present

## 2015-05-30 DIAGNOSIS — I5032 Chronic diastolic (congestive) heart failure: Secondary | ICD-10-CM | POA: Diagnosis not present

## 2015-05-30 DIAGNOSIS — I10 Essential (primary) hypertension: Secondary | ICD-10-CM | POA: Diagnosis not present

## 2015-06-02 DIAGNOSIS — I5032 Chronic diastolic (congestive) heart failure: Secondary | ICD-10-CM | POA: Diagnosis not present

## 2015-06-02 DIAGNOSIS — I2510 Atherosclerotic heart disease of native coronary artery without angina pectoris: Secondary | ICD-10-CM | POA: Diagnosis not present

## 2015-06-02 DIAGNOSIS — F419 Anxiety disorder, unspecified: Secondary | ICD-10-CM | POA: Diagnosis not present

## 2015-06-02 DIAGNOSIS — J40 Bronchitis, not specified as acute or chronic: Secondary | ICD-10-CM | POA: Diagnosis not present

## 2015-06-02 DIAGNOSIS — J441 Chronic obstructive pulmonary disease with (acute) exacerbation: Secondary | ICD-10-CM | POA: Diagnosis not present

## 2015-06-02 DIAGNOSIS — I10 Essential (primary) hypertension: Secondary | ICD-10-CM | POA: Diagnosis not present

## 2015-06-03 ENCOUNTER — Telehealth: Payer: Self-pay | Admitting: Family Medicine

## 2015-06-03 DIAGNOSIS — I2510 Atherosclerotic heart disease of native coronary artery without angina pectoris: Secondary | ICD-10-CM | POA: Diagnosis not present

## 2015-06-03 DIAGNOSIS — I5032 Chronic diastolic (congestive) heart failure: Secondary | ICD-10-CM | POA: Diagnosis not present

## 2015-06-03 DIAGNOSIS — J40 Bronchitis, not specified as acute or chronic: Secondary | ICD-10-CM | POA: Diagnosis not present

## 2015-06-03 DIAGNOSIS — J441 Chronic obstructive pulmonary disease with (acute) exacerbation: Secondary | ICD-10-CM | POA: Diagnosis not present

## 2015-06-03 DIAGNOSIS — F419 Anxiety disorder, unspecified: Secondary | ICD-10-CM | POA: Diagnosis not present

## 2015-06-03 DIAGNOSIS — I10 Essential (primary) hypertension: Secondary | ICD-10-CM | POA: Diagnosis not present

## 2015-06-03 NOTE — Telephone Encounter (Signed)
Marisa Hicks from [facility] called to inform that the pt has a low grade fever (100.1 and 99.1) over the last 4 days, has productive cough, still has slight yellow color to mucus, her lungs are clear, no trouble breathing. Please call Marisa Hicks back with any questions or concerns. Thanks.

## 2015-06-03 NOTE — Telephone Encounter (Signed)
If they can't reach Dr. Raul Del, she needs to go to urgent care tonight She should not wait until Thursday I don't want her to end up back in the hospital

## 2015-06-03 NOTE — Telephone Encounter (Signed)
Routing to provider  

## 2015-06-03 NOTE — Telephone Encounter (Signed)
Please have her call patient's PULMONOLOGIST ASAP Patient was just in the hospital not long ago and I want her lung doctor to know about this and treat this; thank you

## 2015-06-03 NOTE — Telephone Encounter (Signed)
I just spoke with the patient, her daughter spoke with Dr. Gust Brooms office and they scheduled her for Thursday afternoon.  I told her you would like her to be seen at Urgent Care sooner but she was satisfied with the appointments she has set.

## 2015-06-03 NOTE — Telephone Encounter (Signed)
I spoke with Jeani Hawking from [place]: Patient's daughter has tried multiple times to contact Dr. Raul Del at [facility] to let them know what is going on, she has been told they will return her call but that has not happened yet.  Jeani Hawking just thought since she is seeing you on Thursday that you should have this information.    I will continue to try to reach Dr. Gust Brooms office

## 2015-06-04 ENCOUNTER — Telehealth: Payer: Self-pay | Admitting: Family Medicine

## 2015-06-04 ENCOUNTER — Other Ambulatory Visit: Payer: Self-pay | Admitting: *Deleted

## 2015-06-04 DIAGNOSIS — I5032 Chronic diastolic (congestive) heart failure: Secondary | ICD-10-CM | POA: Diagnosis not present

## 2015-06-04 DIAGNOSIS — J40 Bronchitis, not specified as acute or chronic: Secondary | ICD-10-CM | POA: Diagnosis not present

## 2015-06-04 DIAGNOSIS — I2510 Atherosclerotic heart disease of native coronary artery without angina pectoris: Secondary | ICD-10-CM | POA: Diagnosis not present

## 2015-06-04 DIAGNOSIS — F419 Anxiety disorder, unspecified: Secondary | ICD-10-CM | POA: Diagnosis not present

## 2015-06-04 DIAGNOSIS — J441 Chronic obstructive pulmonary disease with (acute) exacerbation: Secondary | ICD-10-CM | POA: Diagnosis not present

## 2015-06-04 DIAGNOSIS — I10 Essential (primary) hypertension: Secondary | ICD-10-CM | POA: Diagnosis not present

## 2015-06-04 NOTE — Telephone Encounter (Signed)
Thank you, but this issue has been addressed in other phone notes; I tried to get her to go to urgent care; tried to get her to Dr. Raul Del

## 2015-06-04 NOTE — Patient Outreach (Signed)
[facility]) Care Management  06/04/2015  JOSELINE MCCAMPBELL [birth year] [id number]   Patient triggered RED on EMMI COPD Dashboard, notification sent to Sherrin Daisy, RN.  Thanks, Ronnell Freshwater. [place], [place] Assistant Phone: [phone] Fax: [phone]

## 2015-06-04 NOTE — Patient Outreach (Signed)
[facility]) Care Management  06/04/2015  Marisa Hicks 1922-05-13 [id number]  Emmi-COPD referral; Red on dashboard for feeling worse overall, coughing more, more mucus and color  change on 06/03/2015.   Telephone call to patient who gave HIPPA verification. States she is feeling much better today because she got a good night's sleep. States coughing has decreased and sputum is whitish in color. States temperature is 99.5. Patient voices that she has appointment with pulmonologist tomorrow 09/01 and has appointment with primary care physician on 09/02 -Friday. States her daughter will provide transportation to doctors appointments.   Patient voices that she is aware of action plan to use if trouble with her COPD such as difficulty breathing and persistent uncontrollable coughing.  Patient states she is taking medications as prescribed by her doctors.   Plan: will close case.  Sherrin Daisy, RN BSN [place] Management Coordinator [facility] Care Management  [phone]

## 2015-06-05 ENCOUNTER — Inpatient Hospital Stay: Payer: Medicare Other | Admitting: Family Medicine

## 2015-06-05 DIAGNOSIS — R05 Cough: Secondary | ICD-10-CM | POA: Diagnosis not present

## 2015-06-05 DIAGNOSIS — I10 Essential (primary) hypertension: Secondary | ICD-10-CM | POA: Diagnosis not present

## 2015-06-05 DIAGNOSIS — F419 Anxiety disorder, unspecified: Secondary | ICD-10-CM | POA: Diagnosis not present

## 2015-06-05 DIAGNOSIS — R509 Fever, unspecified: Secondary | ICD-10-CM | POA: Diagnosis not present

## 2015-06-05 DIAGNOSIS — J449 Chronic obstructive pulmonary disease, unspecified: Secondary | ICD-10-CM | POA: Diagnosis not present

## 2015-06-05 DIAGNOSIS — I2510 Atherosclerotic heart disease of native coronary artery without angina pectoris: Secondary | ICD-10-CM | POA: Diagnosis not present

## 2015-06-05 DIAGNOSIS — J441 Chronic obstructive pulmonary disease with (acute) exacerbation: Secondary | ICD-10-CM | POA: Diagnosis not present

## 2015-06-05 DIAGNOSIS — D72829 Elevated white blood cell count, unspecified: Secondary | ICD-10-CM | POA: Diagnosis not present

## 2015-06-05 DIAGNOSIS — J40 Bronchitis, not specified as acute or chronic: Secondary | ICD-10-CM | POA: Diagnosis not present

## 2015-06-05 DIAGNOSIS — I5032 Chronic diastolic (congestive) heart failure: Secondary | ICD-10-CM | POA: Diagnosis not present

## 2015-06-06 ENCOUNTER — Ambulatory Visit (INDEPENDENT_AMBULATORY_CARE_PROVIDER_SITE_OTHER): Payer: Medicare Other | Admitting: Family Medicine

## 2015-06-06 ENCOUNTER — Encounter: Payer: Self-pay | Admitting: Family Medicine

## 2015-06-06 VITALS — BP 153/68 | HR 78 | Temp 99.0°F | Ht <= 58 in | Wt 125.0 lb

## 2015-06-06 DIAGNOSIS — I471 Supraventricular tachycardia: Secondary | ICD-10-CM | POA: Diagnosis not present

## 2015-06-06 DIAGNOSIS — J441 Chronic obstructive pulmonary disease with (acute) exacerbation: Secondary | ICD-10-CM | POA: Diagnosis not present

## 2015-06-06 DIAGNOSIS — J209 Acute bronchitis, unspecified: Secondary | ICD-10-CM | POA: Diagnosis not present

## 2015-06-06 DIAGNOSIS — D72820 Lymphocytosis (symptomatic): Secondary | ICD-10-CM

## 2015-06-06 DIAGNOSIS — J449 Chronic obstructive pulmonary disease, unspecified: Secondary | ICD-10-CM | POA: Diagnosis not present

## 2015-06-06 DIAGNOSIS — I10 Essential (primary) hypertension: Secondary | ICD-10-CM

## 2015-06-06 DIAGNOSIS — G4733 Obstructive sleep apnea (adult) (pediatric): Secondary | ICD-10-CM | POA: Diagnosis not present

## 2015-06-06 DIAGNOSIS — I2510 Atherosclerotic heart disease of native coronary artery without angina pectoris: Secondary | ICD-10-CM | POA: Diagnosis not present

## 2015-06-06 DIAGNOSIS — E785 Hyperlipidemia, unspecified: Secondary | ICD-10-CM | POA: Diagnosis not present

## 2015-06-06 MED ORDER — HYDROCOD POLST-CPM POLST ER 10-8 MG/5ML PO SUER: 5.0000 mL | Freq: Two times a day (BID) | ORAL | Status: DC | PRN

## 2015-06-06 NOTE — Patient Instructions (Addendum)
Please do eat yogurt daily or take a probiotic daily for the next month or two We want to replace the healthy germs in the gut If you notice foul, watery diarrhea in the next two months, schedule an appointment RIGHT AWAY Keep your follow-up with your other specialists Stay on a low-salt diet Please do get a flu shot in the next month or so, October or so and get the PCV-13 (Prevnar) at the same time Avoid hospitals and nursing home Return in 5-6 months, but know that I am here if you need me

## 2015-06-06 NOTE — Progress Notes (Signed)
BP 153/68 mmHg  Pulse 78  Temp(Src) 99 F (37.2 C)  Ht 4' 9.5" (1.461 m)  Wt 125 lb (56.7 kg)  BMI 26.56 kg/m2  SpO2 97%   Subjective:    Patient ID: Marisa Hicks, female    DOB: Apr 10, 1922, 81 y.o.   MRN: [id number]  HPI: INSIYA Hicks is a 81 y.o. female  Chief Complaint  Patient presents with  . Hospitalization Follow-up    She was in for COPD exacerbation   She was hospitalized with COPD a few weeks ago; was hospitalized, on prednisone and antibiotics and breathing treatments; she saw the pulmonologist yesterday for hospital f/u as well; they talked to Dr. Raul Del about the nebulizer since she was using that four times a day in the hospital; he told her to cut it to three times a day  Then started to run low grade fever; coughing up phlegm; color has lightened; temp 100.1 at one point Lungs were clear Dr. Raul Del said; he still thought she had some infection, did chest xray, no pneumonia; he started augmentin; BP similar; pulse ox 95%; WBC 15.8k; patient requested cough syrup refill today  Then she saw Dr. Humphrey Rolls cardiologist this morning; he thought she was doing well; no more flutters with the medicine change; he was pleased with that; he does not expect any more  No chest pain; no abd pain; no dark stools; we talked about the weight difference because hospital weight was 145 pounds; she says she was swollen up, full of fluid in her legs; urinated a lot after they gave her some medicine to make her pee;   Relevant past medical, surgical, family and social history reviewed and updated as indicated. Interim medical history since our last visit reviewed. Allergies and medications reviewed and updated.  Review of Systems Per HPI unless specifically indicated above     Objective:    BP 153/68 mmHg  Pulse 78  Temp(Src) 99 F (37.2 C)  Ht 4' 9.5" (1.461 m)  Wt 125 lb (56.7 kg)  BMI 26.56 kg/m2  SpO2 97%  Wt Readings from Last 3 Encounters:  06/06/15 125 lb (56.7 kg)   05/22/15 145 lb 1.6 oz (65.817 kg)  05/16/15 129 lb 3.2 oz (58.605 kg)    Physical Exam  Constitutional: She appears well-developed and well-nourished. No distress.  HENT:  Head: Normocephalic and atraumatic.  Eyes: EOM are normal. No scleral icterus.  Neck: No thyromegaly present.  Cardiovascular: Normal rate, regular rhythm and normal heart sounds.   No murmur heard. Pulmonary/Chest: Effort normal and breath sounds normal. No respiratory distress. She has no wheezes.  Abdominal: Soft. Bowel sounds are normal. She exhibits no distension.  Musculoskeletal: Normal range of motion. She exhibits no edema.  Neurological: She is alert. She exhibits normal muscle tone.  Skin: Skin is warm and dry. She is not diaphoretic. No pallor.  Psychiatric: She has a normal mood and affect. Her behavior is normal. Judgment and thought content normal.    Results for orders placed or performed during the hospital encounter of 19/50/93  Basic metabolic panel  Result Value Ref Range   Sodium 137 135 - 145 mmol/L   Potassium 4.4 3.5 - 5.1 mmol/L   Chloride 104 101 - 111 mmol/L   CO2 23 22 - 32 mmol/L   Glucose, Bld 180 (H) 65 - 99 mg/dL   BUN 38 (H) 6 - 20 mg/dL   Creatinine, Ser 1.12 (H) 0.44 - 1.00 mg/dL   Calcium  9.2 8.9 - 10.3 mg/dL   GFR calc non Af Amer 42 (L) >60 mL/min   GFR calc Af Amer 48 (L) >60 mL/min   Anion gap 10 5 - 15  CBC  Result Value Ref Range   WBC 17.8 (H) 3.6 - 11.0 K/uL   RBC 3.78 (L) 3.80 - 5.20 MIL/uL   Hemoglobin 11.5 (L) 12.0 - 16.0 g/dL   HCT 34.4 (L) 35.0 - 47.0 %   MCV 90.9 80.0 - 100.0 fL   MCH 30.4 26.0 - 34.0 pg   MCHC 33.5 32.0 - 36.0 g/dL   RDW 12.9 11.5 - 14.5 %   Platelets 268 150 - 440 K/uL  Troponin I  Result Value Ref Range   Troponin I <0.03 <0.031 ng/mL  TSH  Result Value Ref Range   TSH 1.090 0.350 - 4.500 uIU/mL  Hemoglobin A1c  Result Value Ref Range   Hgb A1c MFr Bld 6.0 4.0 - 6.0 %  Platelet count  Result Value Ref Range   Platelets  343 150 - 440 K/uL      Assessment & Plan:   Problem List Items Addressed This Visit      Cardiovascular and Mediastinum   Hypertension    DASH guidelines encouraged, especially low sodium diet        Respiratory   COPD (chronic obstructive pulmonary disease)    Pneumonia vaccine when feeling better; see AVS      Relevant Medications   PROVENTIL HFA 108 (90 BASE) MCG/ACT inhaler   chlorpheniramine-HYDROcodone (TUSSIONEX PENNKINETIC ER) 10-8 MG/5ML SUER   COPD exacerbation    As above, following with pulmonologist, on medicines      Relevant Medications   PROVENTIL HFA 108 (90 BASE) MCG/ACT inhaler   chlorpheniramine-HYDROcodone (TUSSIONEX PENNKINETIC ER) 10-8 MG/5ML SUER   Bronchitis, acute, with bronchospasm - Primary    Currently seeing pulmonologist as well; glad to hear she is feeling better; continue current treatments; since she is on antibiotics, discussed with her the risk of C. Diff colitis; encouraged yogurt or probiotics; discussed reasons to seek medical care right away; C. Diff can be fatal        Other   Lymphocytosis    Impacted by prednisone as well; will not recheck right now          Follow up plan: Return in about 5 months (around 11/06/2015) for Dr. Sanda Klein, one month with Amy for flu and PCV-13.  An after-visit summary was printed and given to the patient at [facility].  Please see the patient instructions which may contain other information and recommendations beyond what is mentioned above in the assessment and plan. Meds ordered this encounter  Medications  . amoxicillin-clavulanate (AUGMENTIN) 500-125 MG per tablet    Sig: Take by mouth.  Marland Kitchen PROVENTIL HFA 108 (90 BASE) MCG/ACT inhaler    Sig: INHALE 2 PUFFS AS DIRECTED    Refill:  1  . chlorpheniramine-HYDROcodone (TUSSIONEX PENNKINETIC ER) 10-8 MG/5ML SUER    Sig: Take 5 mLs by mouth every 12 (twelve) hours as needed for cough.    Dispense:  115 mL    Refill:  0

## 2015-06-09 DIAGNOSIS — I2510 Atherosclerotic heart disease of native coronary artery without angina pectoris: Secondary | ICD-10-CM | POA: Diagnosis not present

## 2015-06-09 DIAGNOSIS — I5032 Chronic diastolic (congestive) heart failure: Secondary | ICD-10-CM | POA: Diagnosis not present

## 2015-06-09 DIAGNOSIS — I10 Essential (primary) hypertension: Secondary | ICD-10-CM | POA: Diagnosis not present

## 2015-06-09 DIAGNOSIS — J40 Bronchitis, not specified as acute or chronic: Secondary | ICD-10-CM | POA: Diagnosis not present

## 2015-06-09 DIAGNOSIS — J441 Chronic obstructive pulmonary disease with (acute) exacerbation: Secondary | ICD-10-CM | POA: Diagnosis not present

## 2015-06-09 DIAGNOSIS — F419 Anxiety disorder, unspecified: Secondary | ICD-10-CM | POA: Diagnosis not present

## 2015-06-10 DIAGNOSIS — I2510 Atherosclerotic heart disease of native coronary artery without angina pectoris: Secondary | ICD-10-CM | POA: Diagnosis not present

## 2015-06-10 DIAGNOSIS — I5032 Chronic diastolic (congestive) heart failure: Secondary | ICD-10-CM | POA: Diagnosis not present

## 2015-06-10 DIAGNOSIS — F419 Anxiety disorder, unspecified: Secondary | ICD-10-CM | POA: Diagnosis not present

## 2015-06-10 DIAGNOSIS — I10 Essential (primary) hypertension: Secondary | ICD-10-CM | POA: Diagnosis not present

## 2015-06-10 DIAGNOSIS — J441 Chronic obstructive pulmonary disease with (acute) exacerbation: Secondary | ICD-10-CM | POA: Diagnosis not present

## 2015-06-10 DIAGNOSIS — J40 Bronchitis, not specified as acute or chronic: Secondary | ICD-10-CM | POA: Diagnosis not present

## 2015-06-10 NOTE — Assessment & Plan Note (Addendum)
Currently seeing pulmonologist as well; glad to hear she is feeling better; continue current treatments; since she is on antibiotics, discussed with her the risk of C. Diff colitis; encouraged yogurt or probiotics; discussed reasons to seek medical care right away; C. Diff can be fatal

## 2015-06-10 NOTE — Assessment & Plan Note (Signed)
As above, following with pulmonologist, on medicines

## 2015-06-10 NOTE — Assessment & Plan Note (Signed)
DASH guidelines encouraged, especially low sodium diet

## 2015-06-10 NOTE — Assessment & Plan Note (Signed)
Pneumonia vaccine when feeling better; see AVS

## 2015-06-10 NOTE — Assessment & Plan Note (Signed)
Impacted by prednisone as well; will not recheck right now

## 2015-06-11 DIAGNOSIS — I5032 Chronic diastolic (congestive) heart failure: Secondary | ICD-10-CM | POA: Diagnosis not present

## 2015-06-11 DIAGNOSIS — I10 Essential (primary) hypertension: Secondary | ICD-10-CM | POA: Diagnosis not present

## 2015-06-11 DIAGNOSIS — J40 Bronchitis, not specified as acute or chronic: Secondary | ICD-10-CM | POA: Diagnosis not present

## 2015-06-11 DIAGNOSIS — I2510 Atherosclerotic heart disease of native coronary artery without angina pectoris: Secondary | ICD-10-CM | POA: Diagnosis not present

## 2015-06-11 DIAGNOSIS — J441 Chronic obstructive pulmonary disease with (acute) exacerbation: Secondary | ICD-10-CM | POA: Diagnosis not present

## 2015-06-11 DIAGNOSIS — F419 Anxiety disorder, unspecified: Secondary | ICD-10-CM | POA: Diagnosis not present

## 2015-06-11 NOTE — Patient Outreach (Signed)
[facility]) Care Management  06/11/2015  Marisa Hicks [birth year] [id number]   Notification from Sherrin Daisy, RN to close case as patient assessed and not further intervention needed.  Thanks, Ronnell Freshwater. [place], [place] Assistant Phone: [phone] Fax: [phone]

## 2015-06-12 DIAGNOSIS — J40 Bronchitis, not specified as acute or chronic: Secondary | ICD-10-CM | POA: Diagnosis not present

## 2015-06-12 DIAGNOSIS — F419 Anxiety disorder, unspecified: Secondary | ICD-10-CM | POA: Diagnosis not present

## 2015-06-12 DIAGNOSIS — J441 Chronic obstructive pulmonary disease with (acute) exacerbation: Secondary | ICD-10-CM | POA: Diagnosis not present

## 2015-06-12 DIAGNOSIS — I2510 Atherosclerotic heart disease of native coronary artery without angina pectoris: Secondary | ICD-10-CM | POA: Diagnosis not present

## 2015-06-12 DIAGNOSIS — I5032 Chronic diastolic (congestive) heart failure: Secondary | ICD-10-CM | POA: Diagnosis not present

## 2015-06-12 DIAGNOSIS — I10 Essential (primary) hypertension: Secondary | ICD-10-CM | POA: Diagnosis not present

## 2015-06-16 ENCOUNTER — Other Ambulatory Visit: Payer: Self-pay | Admitting: Family Medicine

## 2015-06-16 DIAGNOSIS — J40 Bronchitis, not specified as acute or chronic: Secondary | ICD-10-CM | POA: Diagnosis not present

## 2015-06-16 DIAGNOSIS — I2510 Atherosclerotic heart disease of native coronary artery without angina pectoris: Secondary | ICD-10-CM | POA: Diagnosis not present

## 2015-06-16 DIAGNOSIS — I10 Essential (primary) hypertension: Secondary | ICD-10-CM | POA: Diagnosis not present

## 2015-06-16 DIAGNOSIS — J441 Chronic obstructive pulmonary disease with (acute) exacerbation: Secondary | ICD-10-CM | POA: Diagnosis not present

## 2015-06-16 DIAGNOSIS — I5032 Chronic diastolic (congestive) heart failure: Secondary | ICD-10-CM | POA: Diagnosis not present

## 2015-06-16 DIAGNOSIS — F419 Anxiety disorder, unspecified: Secondary | ICD-10-CM | POA: Diagnosis not present

## 2015-06-16 NOTE — Telephone Encounter (Signed)
Please have pharmacy send this to her pulmonologist

## 2015-06-17 DIAGNOSIS — F419 Anxiety disorder, unspecified: Secondary | ICD-10-CM | POA: Diagnosis not present

## 2015-06-17 DIAGNOSIS — I2510 Atherosclerotic heart disease of native coronary artery without angina pectoris: Secondary | ICD-10-CM | POA: Diagnosis not present

## 2015-06-17 DIAGNOSIS — I5032 Chronic diastolic (congestive) heart failure: Secondary | ICD-10-CM | POA: Diagnosis not present

## 2015-06-17 DIAGNOSIS — J40 Bronchitis, not specified as acute or chronic: Secondary | ICD-10-CM | POA: Diagnosis not present

## 2015-06-17 DIAGNOSIS — I10 Essential (primary) hypertension: Secondary | ICD-10-CM | POA: Diagnosis not present

## 2015-06-17 DIAGNOSIS — J441 Chronic obstructive pulmonary disease with (acute) exacerbation: Secondary | ICD-10-CM | POA: Diagnosis not present

## 2015-06-17 NOTE — Telephone Encounter (Signed)
Pharmacy called and was given the fax and phone number for [facility] pulmonology.

## 2015-06-24 ENCOUNTER — Telehealth: Payer: Self-pay | Admitting: Family Medicine

## 2015-06-24 DIAGNOSIS — I10 Essential (primary) hypertension: Secondary | ICD-10-CM | POA: Diagnosis not present

## 2015-06-24 DIAGNOSIS — I5032 Chronic diastolic (congestive) heart failure: Secondary | ICD-10-CM | POA: Diagnosis not present

## 2015-06-24 DIAGNOSIS — J40 Bronchitis, not specified as acute or chronic: Secondary | ICD-10-CM | POA: Diagnosis not present

## 2015-06-24 DIAGNOSIS — I2510 Atherosclerotic heart disease of native coronary artery without angina pectoris: Secondary | ICD-10-CM | POA: Diagnosis not present

## 2015-06-24 DIAGNOSIS — J209 Acute bronchitis, unspecified: Secondary | ICD-10-CM

## 2015-06-24 DIAGNOSIS — J441 Chronic obstructive pulmonary disease with (acute) exacerbation: Secondary | ICD-10-CM | POA: Diagnosis not present

## 2015-06-24 DIAGNOSIS — F419 Anxiety disorder, unspecified: Secondary | ICD-10-CM | POA: Diagnosis not present

## 2015-06-24 NOTE — Assessment & Plan Note (Signed)
Refer to OT per family request

## 2015-06-24 NOTE — Telephone Encounter (Signed)
We need a reason for occupational therapy and she'll need a face-to-face visit please

## 2015-06-24 NOTE — Telephone Encounter (Signed)
Jeani Hawking from [place] called stated the pt's family is interested in Occupational Therapy. [facility] would like a verbal order for this service. Please contact Jeani Hawking @ [place] stated voicemail is fine if she does not answer, just need to leave the name and if Dr. Sanda Klein has approved. Thanks.

## 2015-06-24 NOTE — Telephone Encounter (Signed)
I spoke back with Marisa Hicks, she states the daughter is requesting it. Family has been staying with her around the clock. They need to return to their lives.  Want to make sure she is capable of doing ADL's.  She last had an OV on 06/06/15, does she still need a face to face?

## 2015-06-24 NOTE — Telephone Encounter (Signed)
Left message to call.

## 2015-06-24 NOTE — Telephone Encounter (Signed)
I looked through my documentation; she does have primary diagnosis of acute bronchitis with bronchospasm, but I don't know if the note will count for Medicare We can try; go ahead and make the referral and then we can bring her back in for a face-to-face if they need me to document further (weakness, gait, etc.); those are Medicare's rules, not mine; sorry Referral entered; thank you

## 2015-06-24 NOTE — Telephone Encounter (Signed)
Routing to provider  

## 2015-06-25 DIAGNOSIS — J441 Chronic obstructive pulmonary disease with (acute) exacerbation: Secondary | ICD-10-CM | POA: Diagnosis not present

## 2015-06-25 DIAGNOSIS — I10 Essential (primary) hypertension: Secondary | ICD-10-CM | POA: Diagnosis not present

## 2015-06-25 DIAGNOSIS — I2510 Atherosclerotic heart disease of native coronary artery without angina pectoris: Secondary | ICD-10-CM | POA: Diagnosis not present

## 2015-06-25 DIAGNOSIS — F419 Anxiety disorder, unspecified: Secondary | ICD-10-CM | POA: Diagnosis not present

## 2015-06-25 DIAGNOSIS — J40 Bronchitis, not specified as acute or chronic: Secondary | ICD-10-CM | POA: Diagnosis not present

## 2015-06-25 DIAGNOSIS — I5032 Chronic diastolic (congestive) heart failure: Secondary | ICD-10-CM | POA: Diagnosis not present

## 2015-06-25 NOTE — Telephone Encounter (Signed)
Marisa Hicks with [facility] notified.

## 2015-06-30 DIAGNOSIS — I5032 Chronic diastolic (congestive) heart failure: Secondary | ICD-10-CM | POA: Diagnosis not present

## 2015-06-30 DIAGNOSIS — F419 Anxiety disorder, unspecified: Secondary | ICD-10-CM | POA: Diagnosis not present

## 2015-06-30 DIAGNOSIS — J40 Bronchitis, not specified as acute or chronic: Secondary | ICD-10-CM | POA: Diagnosis not present

## 2015-06-30 DIAGNOSIS — J441 Chronic obstructive pulmonary disease with (acute) exacerbation: Secondary | ICD-10-CM | POA: Diagnosis not present

## 2015-06-30 DIAGNOSIS — I10 Essential (primary) hypertension: Secondary | ICD-10-CM | POA: Diagnosis not present

## 2015-06-30 DIAGNOSIS — I2510 Atherosclerotic heart disease of native coronary artery without angina pectoris: Secondary | ICD-10-CM | POA: Diagnosis not present

## 2015-07-01 DIAGNOSIS — J40 Bronchitis, not specified as acute or chronic: Secondary | ICD-10-CM | POA: Diagnosis not present

## 2015-07-01 DIAGNOSIS — I10 Essential (primary) hypertension: Secondary | ICD-10-CM | POA: Diagnosis not present

## 2015-07-01 DIAGNOSIS — I5032 Chronic diastolic (congestive) heart failure: Secondary | ICD-10-CM | POA: Diagnosis not present

## 2015-07-01 DIAGNOSIS — F419 Anxiety disorder, unspecified: Secondary | ICD-10-CM | POA: Diagnosis not present

## 2015-07-01 DIAGNOSIS — J441 Chronic obstructive pulmonary disease with (acute) exacerbation: Secondary | ICD-10-CM | POA: Diagnosis not present

## 2015-07-01 DIAGNOSIS — I2510 Atherosclerotic heart disease of native coronary artery without angina pectoris: Secondary | ICD-10-CM | POA: Diagnosis not present

## 2015-07-04 DIAGNOSIS — J441 Chronic obstructive pulmonary disease with (acute) exacerbation: Secondary | ICD-10-CM | POA: Diagnosis not present

## 2015-07-04 DIAGNOSIS — I5032 Chronic diastolic (congestive) heart failure: Secondary | ICD-10-CM | POA: Diagnosis not present

## 2015-07-04 DIAGNOSIS — I10 Essential (primary) hypertension: Secondary | ICD-10-CM | POA: Diagnosis not present

## 2015-07-04 DIAGNOSIS — F419 Anxiety disorder, unspecified: Secondary | ICD-10-CM | POA: Diagnosis not present

## 2015-07-04 DIAGNOSIS — I2510 Atherosclerotic heart disease of native coronary artery without angina pectoris: Secondary | ICD-10-CM | POA: Diagnosis not present

## 2015-07-04 DIAGNOSIS — J40 Bronchitis, not specified as acute or chronic: Secondary | ICD-10-CM | POA: Diagnosis not present

## 2015-07-07 DIAGNOSIS — J441 Chronic obstructive pulmonary disease with (acute) exacerbation: Secondary | ICD-10-CM | POA: Diagnosis not present

## 2015-07-07 DIAGNOSIS — E785 Hyperlipidemia, unspecified: Secondary | ICD-10-CM | POA: Diagnosis not present

## 2015-07-07 DIAGNOSIS — F419 Anxiety disorder, unspecified: Secondary | ICD-10-CM | POA: Diagnosis not present

## 2015-07-07 DIAGNOSIS — I10 Essential (primary) hypertension: Secondary | ICD-10-CM | POA: Diagnosis not present

## 2015-07-07 DIAGNOSIS — G4733 Obstructive sleep apnea (adult) (pediatric): Secondary | ICD-10-CM | POA: Diagnosis not present

## 2015-07-07 DIAGNOSIS — I471 Supraventricular tachycardia: Secondary | ICD-10-CM | POA: Diagnosis not present

## 2015-07-07 DIAGNOSIS — I5032 Chronic diastolic (congestive) heart failure: Secondary | ICD-10-CM | POA: Diagnosis not present

## 2015-07-07 DIAGNOSIS — I2510 Atherosclerotic heart disease of native coronary artery without angina pectoris: Secondary | ICD-10-CM | POA: Diagnosis not present

## 2015-07-07 DIAGNOSIS — J40 Bronchitis, not specified as acute or chronic: Secondary | ICD-10-CM | POA: Diagnosis not present

## 2015-07-08 DIAGNOSIS — J40 Bronchitis, not specified as acute or chronic: Secondary | ICD-10-CM | POA: Diagnosis not present

## 2015-07-08 DIAGNOSIS — I2510 Atherosclerotic heart disease of native coronary artery without angina pectoris: Secondary | ICD-10-CM | POA: Diagnosis not present

## 2015-07-08 DIAGNOSIS — I5032 Chronic diastolic (congestive) heart failure: Secondary | ICD-10-CM | POA: Diagnosis not present

## 2015-07-08 DIAGNOSIS — F419 Anxiety disorder, unspecified: Secondary | ICD-10-CM | POA: Diagnosis not present

## 2015-07-08 DIAGNOSIS — J441 Chronic obstructive pulmonary disease with (acute) exacerbation: Secondary | ICD-10-CM | POA: Diagnosis not present

## 2015-07-08 DIAGNOSIS — I10 Essential (primary) hypertension: Secondary | ICD-10-CM | POA: Diagnosis not present

## 2015-07-09 ENCOUNTER — Ambulatory Visit (INDEPENDENT_AMBULATORY_CARE_PROVIDER_SITE_OTHER): Payer: Medicare Other

## 2015-07-09 DIAGNOSIS — J40 Bronchitis, not specified as acute or chronic: Secondary | ICD-10-CM | POA: Diagnosis not present

## 2015-07-09 DIAGNOSIS — I5032 Chronic diastolic (congestive) heart failure: Secondary | ICD-10-CM | POA: Diagnosis not present

## 2015-07-09 DIAGNOSIS — I10 Essential (primary) hypertension: Secondary | ICD-10-CM | POA: Diagnosis not present

## 2015-07-09 DIAGNOSIS — J441 Chronic obstructive pulmonary disease with (acute) exacerbation: Secondary | ICD-10-CM | POA: Diagnosis not present

## 2015-07-09 DIAGNOSIS — I2510 Atherosclerotic heart disease of native coronary artery without angina pectoris: Secondary | ICD-10-CM | POA: Diagnosis not present

## 2015-07-09 DIAGNOSIS — F419 Anxiety disorder, unspecified: Secondary | ICD-10-CM | POA: Diagnosis not present

## 2015-07-09 DIAGNOSIS — Z23 Encounter for immunization: Secondary | ICD-10-CM

## 2015-07-18 DIAGNOSIS — I10 Essential (primary) hypertension: Secondary | ICD-10-CM | POA: Diagnosis not present

## 2015-07-18 DIAGNOSIS — I2510 Atherosclerotic heart disease of native coronary artery without angina pectoris: Secondary | ICD-10-CM | POA: Diagnosis not present

## 2015-07-18 DIAGNOSIS — J441 Chronic obstructive pulmonary disease with (acute) exacerbation: Secondary | ICD-10-CM | POA: Diagnosis not present

## 2015-07-18 DIAGNOSIS — F419 Anxiety disorder, unspecified: Secondary | ICD-10-CM | POA: Diagnosis not present

## 2015-07-18 DIAGNOSIS — J40 Bronchitis, not specified as acute or chronic: Secondary | ICD-10-CM | POA: Diagnosis not present

## 2015-07-18 DIAGNOSIS — I5032 Chronic diastolic (congestive) heart failure: Secondary | ICD-10-CM | POA: Diagnosis not present

## 2015-07-21 DIAGNOSIS — J40 Bronchitis, not specified as acute or chronic: Secondary | ICD-10-CM | POA: Diagnosis not present

## 2015-07-21 DIAGNOSIS — I2510 Atherosclerotic heart disease of native coronary artery without angina pectoris: Secondary | ICD-10-CM | POA: Diagnosis not present

## 2015-07-21 DIAGNOSIS — J441 Chronic obstructive pulmonary disease with (acute) exacerbation: Secondary | ICD-10-CM | POA: Diagnosis not present

## 2015-07-21 DIAGNOSIS — F419 Anxiety disorder, unspecified: Secondary | ICD-10-CM | POA: Diagnosis not present

## 2015-07-21 DIAGNOSIS — I5032 Chronic diastolic (congestive) heart failure: Secondary | ICD-10-CM | POA: Diagnosis not present

## 2015-07-21 DIAGNOSIS — I10 Essential (primary) hypertension: Secondary | ICD-10-CM | POA: Diagnosis not present

## 2015-07-22 DIAGNOSIS — I5032 Chronic diastolic (congestive) heart failure: Secondary | ICD-10-CM | POA: Diagnosis not present

## 2015-07-22 DIAGNOSIS — I2510 Atherosclerotic heart disease of native coronary artery without angina pectoris: Secondary | ICD-10-CM | POA: Diagnosis not present

## 2015-07-22 DIAGNOSIS — F419 Anxiety disorder, unspecified: Secondary | ICD-10-CM | POA: Diagnosis not present

## 2015-07-22 DIAGNOSIS — I10 Essential (primary) hypertension: Secondary | ICD-10-CM | POA: Diagnosis not present

## 2015-07-22 DIAGNOSIS — J441 Chronic obstructive pulmonary disease with (acute) exacerbation: Secondary | ICD-10-CM | POA: Diagnosis not present

## 2015-07-22 DIAGNOSIS — J40 Bronchitis, not specified as acute or chronic: Secondary | ICD-10-CM | POA: Diagnosis not present

## 2015-08-26 DIAGNOSIS — G4733 Obstructive sleep apnea (adult) (pediatric): Secondary | ICD-10-CM | POA: Diagnosis not present

## 2015-08-26 DIAGNOSIS — R079 Chest pain, unspecified: Secondary | ICD-10-CM | POA: Diagnosis not present

## 2015-08-26 DIAGNOSIS — I2510 Atherosclerotic heart disease of native coronary artery without angina pectoris: Secondary | ICD-10-CM | POA: Diagnosis not present

## 2015-08-26 DIAGNOSIS — E785 Hyperlipidemia, unspecified: Secondary | ICD-10-CM | POA: Diagnosis not present

## 2015-08-26 DIAGNOSIS — R55 Syncope and collapse: Secondary | ICD-10-CM | POA: Diagnosis not present

## 2015-08-26 DIAGNOSIS — R0602 Shortness of breath: Secondary | ICD-10-CM | POA: Diagnosis not present

## 2015-08-26 DIAGNOSIS — I10 Essential (primary) hypertension: Secondary | ICD-10-CM | POA: Diagnosis not present

## 2015-09-08 DIAGNOSIS — I340 Nonrheumatic mitral (valve) insufficiency: Secondary | ICD-10-CM | POA: Diagnosis not present

## 2015-09-08 DIAGNOSIS — E785 Hyperlipidemia, unspecified: Secondary | ICD-10-CM | POA: Diagnosis not present

## 2015-09-08 DIAGNOSIS — J019 Acute sinusitis, unspecified: Secondary | ICD-10-CM | POA: Diagnosis not present

## 2015-09-08 DIAGNOSIS — I2510 Atherosclerotic heart disease of native coronary artery without angina pectoris: Secondary | ICD-10-CM | POA: Diagnosis not present

## 2015-09-08 DIAGNOSIS — I10 Essential (primary) hypertension: Secondary | ICD-10-CM | POA: Diagnosis not present

## 2015-09-08 DIAGNOSIS — G4733 Obstructive sleep apnea (adult) (pediatric): Secondary | ICD-10-CM | POA: Diagnosis not present

## 2015-09-08 DIAGNOSIS — I471 Supraventricular tachycardia: Secondary | ICD-10-CM | POA: Diagnosis not present

## 2015-09-11 ENCOUNTER — Emergency Department: Payer: Medicare Other

## 2015-09-11 ENCOUNTER — Encounter: Payer: Self-pay | Admitting: *Deleted

## 2015-09-11 ENCOUNTER — Inpatient Hospital Stay
Admission: EM | Admit: 2015-09-11 | Discharge: 2015-09-15 | DRG: 535 | Disposition: A | Discharge status: Skilled Nursing Facility | Payer: Medicare Other | Attending: Internal Medicine | Admitting: Internal Medicine

## 2015-09-11 DIAGNOSIS — J329 Chronic sinusitis, unspecified: Secondary | ICD-10-CM | POA: Diagnosis present

## 2015-09-11 DIAGNOSIS — I2510 Atherosclerotic heart disease of native coronary artery without angina pectoris: Secondary | ICD-10-CM | POA: Diagnosis present

## 2015-09-11 DIAGNOSIS — M25552 Pain in left hip: Secondary | ICD-10-CM | POA: Diagnosis not present

## 2015-09-11 DIAGNOSIS — I638 Other cerebral infarction: Secondary | ICD-10-CM | POA: Diagnosis not present

## 2015-09-11 DIAGNOSIS — M25522 Pain in left elbow: Secondary | ICD-10-CM | POA: Diagnosis not present

## 2015-09-11 DIAGNOSIS — Z66 Do not resuscitate: Secondary | ICD-10-CM | POA: Diagnosis present

## 2015-09-11 DIAGNOSIS — I509 Heart failure, unspecified: Secondary | ICD-10-CM | POA: Diagnosis present

## 2015-09-11 DIAGNOSIS — R202 Paresthesia of skin: Secondary | ICD-10-CM | POA: Diagnosis not present

## 2015-09-11 DIAGNOSIS — S32592A Other specified fracture of left pubis, initial encounter for closed fracture: Secondary | ICD-10-CM | POA: Diagnosis not present

## 2015-09-11 DIAGNOSIS — S5002XA Contusion of left elbow, initial encounter: Secondary | ICD-10-CM | POA: Diagnosis present

## 2015-09-11 DIAGNOSIS — I69298 Other sequelae of other nontraumatic intracranial hemorrhage: Secondary | ICD-10-CM | POA: Diagnosis not present

## 2015-09-11 DIAGNOSIS — S3282XA Multiple fractures of pelvis without disruption of pelvic ring, initial encounter for closed fracture: Principal | ICD-10-CM | POA: Diagnosis present

## 2015-09-11 DIAGNOSIS — M79605 Pain in left leg: Secondary | ICD-10-CM | POA: Diagnosis not present

## 2015-09-11 DIAGNOSIS — W19XXXA Unspecified fall, initial encounter: Secondary | ICD-10-CM | POA: Diagnosis present

## 2015-09-11 DIAGNOSIS — I110 Hypertensive heart disease with heart failure: Secondary | ICD-10-CM | POA: Diagnosis present

## 2015-09-11 DIAGNOSIS — Z7901 Long term (current) use of anticoagulants: Secondary | ICD-10-CM | POA: Diagnosis not present

## 2015-09-11 DIAGNOSIS — G4730 Sleep apnea, unspecified: Secondary | ICD-10-CM | POA: Diagnosis present

## 2015-09-11 DIAGNOSIS — M6281 Muscle weakness (generalized): Secondary | ICD-10-CM | POA: Diagnosis not present

## 2015-09-11 DIAGNOSIS — M1990 Unspecified osteoarthritis, unspecified site: Secondary | ICD-10-CM | POA: Diagnosis present

## 2015-09-11 DIAGNOSIS — I639 Cerebral infarction, unspecified: Secondary | ICD-10-CM | POA: Diagnosis present

## 2015-09-11 DIAGNOSIS — J449 Chronic obstructive pulmonary disease, unspecified: Secondary | ICD-10-CM | POA: Diagnosis present

## 2015-09-11 DIAGNOSIS — I6521 Occlusion and stenosis of right carotid artery: Secondary | ICD-10-CM | POA: Diagnosis not present

## 2015-09-11 DIAGNOSIS — M810 Age-related osteoporosis without current pathological fracture: Secondary | ICD-10-CM | POA: Diagnosis present

## 2015-09-11 DIAGNOSIS — M1612 Unilateral primary osteoarthritis, left hip: Secondary | ICD-10-CM | POA: Diagnosis present

## 2015-09-11 DIAGNOSIS — Z79891 Long term (current) use of opiate analgesic: Secondary | ICD-10-CM

## 2015-09-11 DIAGNOSIS — S329XXD Fracture of unspecified parts of lumbosacral spine and pelvis, subsequent encounter for fracture with routine healing: Secondary | ICD-10-CM | POA: Diagnosis not present

## 2015-09-11 DIAGNOSIS — I10 Essential (primary) hypertension: Secondary | ICD-10-CM | POA: Diagnosis not present

## 2015-09-11 DIAGNOSIS — R002 Palpitations: Secondary | ICD-10-CM

## 2015-09-11 DIAGNOSIS — E876 Hypokalemia: Secondary | ICD-10-CM | POA: Diagnosis present

## 2015-09-11 DIAGNOSIS — M25559 Pain in unspecified hip: Secondary | ICD-10-CM | POA: Diagnosis not present

## 2015-09-11 DIAGNOSIS — Z7982 Long term (current) use of aspirin: Secondary | ICD-10-CM

## 2015-09-11 DIAGNOSIS — T148 Other injury of unspecified body region: Secondary | ICD-10-CM | POA: Diagnosis not present

## 2015-09-11 DIAGNOSIS — R278 Other lack of coordination: Secondary | ICD-10-CM | POA: Diagnosis not present

## 2015-09-11 DIAGNOSIS — R1312 Dysphagia, oropharyngeal phase: Secondary | ICD-10-CM | POA: Diagnosis not present

## 2015-09-11 DIAGNOSIS — S329XXA Fracture of unspecified parts of lumbosacral spine and pelvis, initial encounter for closed fracture: Secondary | ICD-10-CM | POA: Diagnosis present

## 2015-09-11 DIAGNOSIS — I479 Paroxysmal tachycardia, unspecified: Secondary | ICD-10-CM | POA: Diagnosis not present

## 2015-09-11 DIAGNOSIS — T380X5A Adverse effect of glucocorticoids and synthetic analogues, initial encounter: Secondary | ICD-10-CM | POA: Diagnosis present

## 2015-09-11 DIAGNOSIS — R2681 Unsteadiness on feet: Secondary | ICD-10-CM | POA: Diagnosis not present

## 2015-09-11 DIAGNOSIS — R269 Unspecified abnormalities of gait and mobility: Secondary | ICD-10-CM | POA: Diagnosis present

## 2015-09-11 DIAGNOSIS — G458 Other transient cerebral ischemic attacks and related syndromes: Secondary | ICD-10-CM | POA: Diagnosis not present

## 2015-09-11 DIAGNOSIS — D72829 Elevated white blood cell count, unspecified: Secondary | ICD-10-CM | POA: Diagnosis present

## 2015-09-11 DIAGNOSIS — Z79899 Other long term (current) drug therapy: Secondary | ICD-10-CM

## 2015-09-11 DIAGNOSIS — Z5189 Encounter for other specified aftercare: Secondary | ICD-10-CM | POA: Diagnosis not present

## 2015-09-11 DIAGNOSIS — I272 Other secondary pulmonary hypertension: Secondary | ICD-10-CM | POA: Diagnosis present

## 2015-09-11 DIAGNOSIS — Z7902 Long term (current) use of antithrombotics/antiplatelets: Secondary | ICD-10-CM | POA: Diagnosis not present

## 2015-09-11 DIAGNOSIS — Z8673 Personal history of transient ischemic attack (TIA), and cerebral infarction without residual deficits: Secondary | ICD-10-CM

## 2015-09-11 DIAGNOSIS — Z888 Allergy status to other drugs, medicaments and biological substances status: Secondary | ICD-10-CM

## 2015-09-11 DIAGNOSIS — E785 Hyperlipidemia, unspecified: Secondary | ICD-10-CM | POA: Diagnosis present

## 2015-09-11 DIAGNOSIS — R296 Repeated falls: Secondary | ICD-10-CM | POA: Diagnosis not present

## 2015-09-11 DIAGNOSIS — I63231 Cerebral infarction due to unspecified occlusion or stenosis of right carotid arteries: Secondary | ICD-10-CM | POA: Diagnosis not present

## 2015-09-11 LAB — BASIC METABOLIC PANEL
Anion gap: 6 (ref 5–15)
BUN: 34 mg/dL — AB (ref 4–21)
BUN: 34 mg/dL — AB (ref 6–20)
CHLORIDE: 108 mmol/L (ref 101–111)
CO2: 25 mmol/L (ref 22–32)
CREATININE: 0.8 mg/dL (ref <=1.1)
CREATININE: 0.84 mg/dL (ref 0.44–1.00)
Calcium: 8.5 mg/dL — ABNORMAL LOW (ref 8.9–10.3)
GFR calc Af Amer: >60 mL/min (ref >60)
GFR calc non Af Amer: 59 mL/min — ABNORMAL LOW (ref >60)
GLUCOSE: 118 mg/dL
GLUCOSE: 118 mg/dL — AB (ref 65–99)
Potassium: 3.2 mmol/L — ABNORMAL LOW (ref 3.5–5.1)
Sodium: 139 mmol/L (ref 135–145)

## 2015-09-11 LAB — URINALYSIS COMPLETE WITH MICROSCOPIC (ARMC ONLY)
Bacteria, UA: NONE SEEN
Bilirubin Urine: NEGATIVE
Glucose, UA: 50 mg/dL — AB
HGB URINE DIPSTICK: NEGATIVE
KETONES UR: NEGATIVE mg/dL
LEUKOCYTES UA: NEGATIVE
Nitrite: NEGATIVE
PH: 7 (ref 5.0–8.0)
RBC / HPF: NONE SEEN RBC/hpf (ref 0–5)
SPECIFIC GRAVITY, URINE: 1.011 (ref 1.005–1.030)

## 2015-09-11 LAB — CBC
HEMATOCRIT: 35.3 % (ref 35.0–47.0)
Hemoglobin: 11.7 g/dL — ABNORMAL LOW (ref 12.0–16.0)
MCH: 29.7 pg (ref 26.0–34.0)
MCHC: 33.2 g/dL (ref 32.0–36.0)
MCV: 89.5 fL (ref 80.0–100.0)
PLATELETS: 310 10*3/uL (ref 150–440)
RBC: 3.95 MIL/uL (ref 3.80–5.20)
RDW: 13.5 % (ref 11.5–14.5)
WBC: 34.1 10*3/uL — AB (ref 3.6–11.0)

## 2015-09-11 LAB — PROTIME-INR
INR: 1.01
PROTHROMBIN TIME: 13.5 s (ref 11.4–15.0)

## 2015-09-11 LAB — TYPE AND SCREEN
ABO/RH(D): O POS
ANTIBODY SCREEN: NEGATIVE

## 2015-09-11 LAB — TROPONIN I: Troponin I: <0.03 ng/mL (ref <0.031)

## 2015-09-11 LAB — ABO/RH: ABO/RH(D): O POS

## 2015-09-11 LAB — MAGNESIUM: MAGNESIUM: 1.7 mg/dL (ref 1.7–2.4)

## 2015-09-11 LAB — APTT: aPTT: 28 seconds (ref 24–36)

## 2015-09-11 MED ORDER — FLUTICASONE PROPIONATE 50 MCG/ACT NA SUSP
1.0000 | Freq: Every day | NASAL | Status: DC
  Administered 2015-09-13 – 2015-09-15 (×3): 1 via NASAL
  Filled 2015-09-11 (×2): qty 16

## 2015-09-11 MED ORDER — RISAQUAD PO CAPS
1.0000 | ORAL_CAPSULE | Freq: Every day | ORAL | Status: DC
  Administered 2015-09-11 – 2015-09-15 (×5): 1 via ORAL
  Filled 2015-09-11 (×5): qty 1

## 2015-09-11 MED ORDER — ACETAMINOPHEN 650 MG RE SUPP: 650.0000 mg | Freq: Four times a day (QID) | RECTAL | Status: DC | PRN

## 2015-09-11 MED ORDER — ACETAMINOPHEN 325 MG PO TABS: 650.0000 mg | ORAL_TABLET | Freq: Four times a day (QID) | ORAL | Status: DC | PRN

## 2015-09-11 MED ORDER — PREDNISONE 20 MG PO TABS
20.0000 mg | ORAL_TABLET | Freq: Every day | ORAL | Status: AC
  Administered 2015-09-12 – 2015-09-15 (×4): 20 mg via ORAL
  Filled 2015-09-11 (×4): qty 1

## 2015-09-11 MED ORDER — TIOTROPIUM BROMIDE MONOHYDRATE 18 MCG IN CAPS
18.0000 ug | ORAL_CAPSULE | Freq: Every day | RESPIRATORY_TRACT | Status: DC
  Filled 2015-09-11: qty 5

## 2015-09-11 MED ORDER — IRBESARTAN 150 MG PO TABS
300.0000 mg | ORAL_TABLET | Freq: Every day | ORAL | Status: DC
  Administered 2015-09-11 – 2015-09-15 (×5): 300 mg via ORAL
  Filled 2015-09-11 (×3): qty 4
  Filled 2015-09-11 (×2): qty 2

## 2015-09-11 MED ORDER — AMLODIPINE BESYLATE 10 MG PO TABS
10.0000 mg | ORAL_TABLET | Freq: Every day | ORAL | Status: DC
  Administered 2015-09-11 – 2015-09-15 (×5): 10 mg via ORAL
  Filled 2015-09-11 (×5): qty 1

## 2015-09-11 MED ORDER — POTASSIUM CHLORIDE CRYS ER 20 MEQ PO TBCR
40.0000 meq | EXTENDED_RELEASE_TABLET | Freq: Once | ORAL | Status: AC
  Administered 2015-09-11: 40 meq via ORAL
  Filled 2015-09-11: qty 2

## 2015-09-11 MED ORDER — RALOXIFENE HCL 60 MG PO TABS
60.0000 mg | ORAL_TABLET | Freq: Every day | ORAL | Status: DC
  Administered 2015-09-11 – 2015-09-15 (×5): 60 mg via ORAL
  Filled 2015-09-11 (×7): qty 1

## 2015-09-11 MED ORDER — SIMVASTATIN 40 MG PO TABS
40.0000 mg | ORAL_TABLET | Freq: Every day | ORAL | Status: DC
  Administered 2015-09-11 – 2015-09-14 (×4): 40 mg via ORAL
  Filled 2015-09-11 (×4): qty 1

## 2015-09-11 MED ORDER — ALBUTEROL SULFATE (2.5 MG/3ML) 0.083% IN NEBU: 2.5000 mg | INHALATION_SOLUTION | RESPIRATORY_TRACT | Status: DC | PRN

## 2015-09-11 MED ORDER — ALPRAZOLAM 0.25 MG PO TABS
0.2500 mg | ORAL_TABLET | Freq: Every day | ORAL | Status: DC
  Administered 2015-09-11 – 2015-09-14 (×4): 0.25 mg via ORAL
  Filled 2015-09-11 (×5): qty 1

## 2015-09-11 MED ORDER — LORATADINE 10 MG PO TABS
10.0000 mg | ORAL_TABLET | Freq: Every day | ORAL | Status: DC
  Administered 2015-09-11 – 2015-09-15 (×5): 10 mg via ORAL
  Filled 2015-09-11 (×5): qty 1

## 2015-09-11 MED ORDER — PREDNISOLONE 5 MG PO TABS
20.0000 mg | ORAL_TABLET | Freq: Every day | ORAL | Status: DC
  Filled 2015-09-11: qty 4

## 2015-09-11 MED ORDER — ALBUTEROL SULFATE HFA 108 (90 BASE) MCG/ACT IN AERS: 2.0000 | INHALATION_SPRAY | Freq: Four times a day (QID) | RESPIRATORY_TRACT | Status: DC | PRN

## 2015-09-11 MED ORDER — ONDANSETRON HCL 4 MG/2ML IJ SOLN
4.0000 mg | Freq: Four times a day (QID) | INTRAMUSCULAR | Status: DC | PRN
  Administered 2015-09-12: 4 mg via INTRAVENOUS
  Filled 2015-09-11: qty 2

## 2015-09-11 MED ORDER — FENTANYL CITRATE (PF) 100 MCG/2ML IJ SOLN
50.0000 ug | Freq: Once | INTRAMUSCULAR | Status: AC
  Administered 2015-09-11: 50 ug via INTRAVENOUS
  Filled 2015-09-11: qty 2

## 2015-09-11 MED ORDER — VITAMIN D 1000 UNITS PO TABS
2000.0000 [IU] | ORAL_TABLET | Freq: Every day | ORAL | Status: DC
Start: 2015-09-11 — End: 2015-09-15
  Administered 2015-09-11 – 2015-09-15 (×5): 2000 [IU] via ORAL
  Filled 2015-09-11 (×6): qty 2

## 2015-09-11 MED ORDER — OXYCODONE-ACETAMINOPHEN 5-325 MG PO TABS
1.0000 | ORAL_TABLET | ORAL | Status: DC | PRN
  Administered 2015-09-11 – 2015-09-15 (×11): 1 via ORAL
  Filled 2015-09-11 (×11): qty 1

## 2015-09-11 MED ORDER — SODIUM CHLORIDE 0.9 % IV BOLUS (SEPSIS)
1000.0000 mL | Freq: Once | INTRAVENOUS | Status: AC
  Administered 2015-09-11: 1000 mL via INTRAVENOUS
  Filled 2015-09-11: qty 1000

## 2015-09-11 MED ORDER — MELATONIN 3 MG PO TABS: 3.0000 mg | ORAL_TABLET | Freq: Every day | ORAL | Status: DC

## 2015-09-11 MED ORDER — MORPHINE SULFATE (PF) 2 MG/ML IV SOLN
2.0000 mg | INTRAVENOUS | Status: DC | PRN
  Administered 2015-09-11: 2 mg via INTRAVENOUS
  Filled 2015-09-11: qty 1

## 2015-09-11 MED ORDER — UMECLIDINIUM-VILANTEROL 62.5-25 MCG/INH IN AEPB: 1.0000 | INHALATION_SPRAY | Freq: Every day | RESPIRATORY_TRACT | Status: DC

## 2015-09-11 MED ORDER — SOTALOL HCL 80 MG PO TABS
80.0000 mg | ORAL_TABLET | Freq: Two times a day (BID) | ORAL | Status: DC
  Administered 2015-09-11 – 2015-09-14 (×6): 80 mg via ORAL
  Filled 2015-09-11 (×7): qty 1

## 2015-09-11 MED ORDER — CLOPIDOGREL BISULFATE 75 MG PO TABS
75.0000 mg | ORAL_TABLET | Freq: Every day | ORAL | Status: DC
  Administered 2015-09-11 – 2015-09-12 (×2): 75 mg via ORAL
  Filled 2015-09-11 (×2): qty 1

## 2015-09-11 MED ORDER — ASPIRIN EC 81 MG PO TBEC
81.0000 mg | DELAYED_RELEASE_TABLET | Freq: Every day | ORAL | Status: DC
  Administered 2015-09-11 – 2015-09-12 (×2): 81 mg via ORAL
  Filled 2015-09-11 (×2): qty 1

## 2015-09-11 MED ORDER — PREDNISONE 20 MG PO TABS
20.0000 mg | ORAL_TABLET | Freq: Once | ORAL | Status: AC
Start: 2015-09-11 — End: 2015-09-11
  Administered 2015-09-11: 20 mg via ORAL

## 2015-09-11 MED ORDER — SALMETEROL XINAFOATE 50 MCG/DOSE IN AEPB
1.0000 | INHALATION_SPRAY | Freq: Two times a day (BID) | RESPIRATORY_TRACT | Status: DC
  Filled 2015-09-11: qty 0

## 2015-09-11 MED ORDER — CALCIUM CARBONATE-VITAMIN D 500-200 MG-UNIT PO TABS
1.0000 | ORAL_TABLET | Freq: Every day | ORAL | Status: DC
  Administered 2015-09-11 – 2015-09-15 (×5): 1 via ORAL
  Filled 2015-09-11 (×5): qty 1

## 2015-09-11 MED ORDER — ONDANSETRON HCL 4 MG PO TABS
4.0000 mg | ORAL_TABLET | Freq: Four times a day (QID) | ORAL | Status: DC | PRN
  Administered 2015-09-15: 4 mg via ORAL
  Filled 2015-09-11: qty 1

## 2015-09-11 MED ORDER — MECLIZINE HCL 25 MG PO TABS: 25.0000 mg | ORAL_TABLET | Freq: Every day | ORAL | Status: DC | PRN

## 2015-09-11 NOTE — ED Notes (Signed)
Pt was at home walking with walker ,tripped and fell, pt denies hitting head or LOC, pt complains of left hip pain, no leg shortening noted, pt has a large bruise to left elbow

## 2015-09-11 NOTE — H&P (Signed)
[facility] [place] NAME: Marisa Hicks    MR#:  [id number]  DATE OF BIRTH:  19-Nov-1921  DATE OF ADMISSION:  09/11/2015  PRIMARY CARE PHYSICIAN: Enid Derry, MD   REQUESTING/REFERRING PHYSICIAN: Eula Listen, MD  CHIEF COMPLAINT:   Chief Complaint  Patient presents with  . Fall  . Hip Pain   fall and left hip pain today.  HISTORY OF PRESENT ILLNESS:  Marisa Hicks  is a 81 y.o. female with a known history of COPD, pulmonary hypertension, arthritis and CAD. The patient fell byaccident today, injured left hip and elbow. The patient complains of the left elbow and left hip pain. She denies any headache or dizziness, no loss of consciousness or syncope. She has had palpitation for a few weeks, for which she is supposed to have Holter monitor. She denies any other symptoms. She has been taking Avelox and Augmentin, and prednisone for the past 5 days prescribed by Dr. Raul Del for sinusitis.  PAST MEDICAL HISTORY:   Past Medical History  Diagnosis Date  . Renal disorder   . Arthritis   . Osteoarthritis   . CHF (congestive heart failure) ([place])   . Dyspnea   . CAD (coronary artery disease)   . COPD (chronic obstructive pulmonary disease) ([place])   . Hypertension   . Hyperlipidemia   . Anxiety   . Allergy   . Osteoporosis   . Proteinuria   . Lymphocytosis   . Dysphagia   . Monocytosis   . Hyponatremia   . Pulmonary hypertension ([place])   . Left ventricular hypertrophy   . Hypertriglyceridemia     PAST SURGICAL HISTORY:   Past Surgical History  Procedure Laterality Date  . Tubal ligation    . Tonsillectomy    . Right knee surgery    . Appendectomy      SOCIAL HISTORY:   Social History  Substance Use Topics  . Smoking status: Never Smoker   . Smokeless tobacco: Never Used  . Alcohol Use: No    FAMILY HISTORY:   Family History  Problem Relation Age of Onset  . Hypertension Father   . Emphysema Father   .  Cancer Sister     "female" then stomach  . COPD Sister   . Stroke Brother   . COPD Daughter   . Heart disease Paternal Grandfather   . COPD Maternal Grandfather     esophageal    DRUG ALLERGIES:   Allergies  Allergen Reactions  . Ace Inhibitors   . Aliskiren-Hydrochlorothiazide Other (See Comments)  . Altace [Ramipril] Other (See Comments)    angioedema  . Atorvastatin Other (See Comments)  . Hydralazine Hcl   . Hydrochlorothiazide Other (See Comments)    hyponatremia  . Irbesartan-Hydrochlorothiazide Other (See Comments)  . Labetalol Hcl   . Rosuvastatin Other (See Comments)  . Tribenzor [Olmesartan-Amlodipine-Hctz] Other (See Comments)    Low sodium    REVIEW OF SYSTEMS:  CONSTITUTIONAL: No fever, fatigue or weakness.  EYES: No blurred or double vision.  EARS, NOSE, AND THROAT: No tinnitus or ear pain. Has postnasal drip. RESPIRATORY: No cough, shortness of breath, wheezing or hemoptysis.  CARDIOVASCULAR: No chest pain, orthopnea, edema.  GASTROINTESTINAL: No nausea, vomiting, diarrhea or abdominal pain.  GENITOURINARY: No dysuria, hematuria.  ENDOCRINE: No polyuria, nocturia,  HEMATOLOGY: No anemia, easy bruising or bleeding SKIN: No rash or lesion. MUSCULOSKELETAL: Left hip pain. Left elbow pain and bruises. NEUROLOGIC: No tingling, numbness, weakness.  PSYCHIATRY:  No anxiety or depression.   MEDICATIONS AT HOME:   Prior to Admission medications   Medication Sig Start Date End Date Taking? Authorizing Provider  acidophilus (RISAQUAD) CAPS capsule Take 1 capsule by mouth daily.   Yes Historical Provider, MD  albuterol (PROVENTIL HFA;VENTOLIN HFA) 108 (90 BASE) MCG/ACT inhaler Inhale 2 puffs into the lungs every 6 (six) hours as needed for wheezing or shortness of breath.   Yes Historical Provider, MD  albuterol (PROVENTIL) (2.5 MG/3ML) 0.083% nebulizer solution Take 3 mLs (2.5 mg total) by nebulization every 4 (four) hours as needed for wheezing or shortness of  breath. Do not use with your rescue inhaler; just one or the other 05/16/15  Yes Arnetha Courser, MD  ALPRAZolam Duanne Moron) 0.25 MG tablet Take 0.25 mg by mouth at bedtime.    Yes Historical Provider, MD  amLODipine (NORVASC) 10 MG tablet Take 10 mg by mouth daily.   Yes Historical Provider, MD  amoxicillin-clavulanate (AUGMENTIN) 500-125 MG tablet Take 1 tablet by mouth 2 (two) times daily. For 7 days 09/08/15  Yes Historical Provider, MD  aspirin EC 81 MG tablet Take 81 mg by mouth daily.   Yes Historical Provider, MD  calcium-vitamin D (OSCAL WITH D) 500-200 MG-UNIT per tablet Take 1 tablet by mouth daily.    Yes Historical Provider, MD  Cholecalciferol (VITAMIN D3) 2000 UNITS TABS Take 2,000 Units by mouth daily.   Yes Historical Provider, MD  clopidogrel (PLAVIX) 75 MG tablet Take 75 mg by mouth daily.   Yes Historical Provider, MD  fexofenadine (ALLEGRA) 180 MG tablet Take 180 mg by mouth daily.   Yes Historical Provider, MD  meclizine (ANTIVERT) 25 MG tablet Take 25 mg by mouth daily as needed for dizziness.   Yes Historical Provider, MD  Melatonin 3 MG TABS Take 3 mg by mouth at bedtime.   Yes Historical Provider, MD  mometasone (NASONEX) 50 MCG/ACT nasal spray Place 2 sprays into the nose at bedtime.    Yes Historical Provider, MD  Multiple Vitamins-Minerals (MULTIVITAMIN WITH MINERALS) tablet Take 1 tablet by mouth daily.   Yes Historical Provider, MD  olmesartan (BENICAR) 40 MG tablet Take 40 mg by mouth daily.   Yes Historical Provider, MD  Omega-3 Fatty Acids (OMEGA-3 FISH OIL) 1200 MG CAPS Take 1,200 mg by mouth daily.   Yes Historical Provider, MD  predniSONE (DELTASONE) 20 MG tablet Take 20 mg by mouth See admin instructions. 40 mg daily for 5 days, then 20 mg daily for 5 days, then stop 09/06/15  Yes Historical Provider, MD  raloxifene (EVISTA) 60 MG tablet Take 60 mg by mouth daily.   Yes Historical Provider, MD  simvastatin (ZOCOR) 40 MG tablet Take 40 mg by mouth at bedtime.    Yes  Historical Provider, MD  sotalol (BETAPACE) 80 MG tablet Take 1 tablet (80 mg total) by mouth every 12 (twelve) hours. 05/23/15  Yes Fritzi Mandes, MD  Umeclidinium-Vilanterol (ANORO ELLIPTA) 62.5-25 MCG/INH AEPB Inhale 1 puff into the lungs daily.   Yes Historical Provider, MD  vitamin C (ASCORBIC ACID) 500 MG tablet Take 500 mg by mouth daily.   Yes Historical Provider, MD  chlorpheniramine-HYDROcodone (TUSSIONEX PENNKINETIC ER) 10-8 MG/5ML SUER Take 5 mLs by mouth every 12 (twelve) hours as needed for cough. Patient not taking: Reported on 09/11/2015 06/06/15   Arnetha Courser, MD      VITAL SIGNS:  Blood pressure 165/99, pulse 77, temperature 97.7 F (36.5 C), temperature source Oral, height 4\' 11"  (  1.499 m), weight 55.339 kg (122 lb), SpO2 99 %.  PHYSICAL EXAMINATION:  GENERAL:  81 y.o.-year-old patient lying in the bed with no acute distress.  EYES: Pupils equal, round, reactive to light and accommodation. No scleral icterus. Extraocular muscles intact.  HEENT: Head atraumatic, normocephalic. Oropharynx and nasopharynx clear.  NECK:  Supple, no jugular venous distention. No thyroid enlargement, no tenderness.  LUNGS: Normal breath sounds bilaterally, no wheezing, rales,rhonchi or crepitation. No use of accessory muscles of respiration.  CARDIOVASCULAR: S1, S2 normal. No murmurs, rubs, or gallops.  ABDOMEN: Soft, nontender, nondistended. Bowel sounds present. No organomegaly or mass.  EXTREMITIES: No pedal edema, cyanosis, or clubbing.  NEUROLOGIC: Cranial nerves II through XII are intact. Muscle strength 5/5 in upper extremities. Sensation intact. Gait not checked. But cannot move lower extremities due to pelvic fracture. PSYCHIATRIC: The patient is alert and oriented x 3.  SKIN: No obvious rash, lesion, or ulcer.   LABORATORY PANEL:   CBC  Recent Labs Lab 09/11/15 1346  WBC 34.1*  HGB 11.7*  HCT 35.3  PLT 310    ------------------------------------------------------------------------------------------------------------------  Chemistries   Recent Labs Lab 09/11/15 1346  NA 139  K 3.2*  CL 108  CO2 25  GLUCOSE 118*  BUN 34*  CREATININE 0.84  CALCIUM 8.5*   ------------------------------------------------------------------------------------------------------------------  Cardiac Enzymes  Recent Labs Lab 09/11/15 1346  TROPONINI <0.03   ------------------------------------------------------------------------------------------------------------------  RADIOLOGY:  Dg Chest 2 View  09/11/2015  CLINICAL DATA:  Pain posterior chest.  Fall this morning.  COPD. EXAM: CHEST  2 VIEW COMPARISON:  Radiograph 05/22/2015 FINDINGS: Normal cardiac silhouette. Aorta is ectatic. There is chronic bronchitic markings again noted. No effusion, infiltrate or pneumothorax. Lungs are hyperinflated. Degenerative osteophytosis of the thoracic spine. IMPRESSION: Hyperinflated lungs without acute findings. Electronically Signed   By: Suzy Bouchard M.D.   On: 09/11/2015 14:56   Dg Elbow Complete Left  09/11/2015  CLINICAL DATA:  Recent fall while using walker with left elbow pain, initial encounter EXAM: LEFT ELBOW - COMPLETE 3+ VIEW COMPARISON:  None. FINDINGS: No acute fracture or dislocation is noted. No significant joint effusion is seen. Soft tissue swelling is noted along the posterior aspect of the proximal forearm and elbow consistent with hematoma from the recent injury IMPRESSION: Soft tissue injury without acute bony abnormality. Electronically Signed   By: Inez Catalina M.D.   On: 09/11/2015 14:19   Dg Hip Unilat With Pelvis 2-3 Views Left  09/11/2015  CLINICAL DATA:  Fall with left hip pain.  Initial encounter. EXAM: DG HIP (WITH OR WITHOUT PELVIS) 2-3V LEFT COMPARISON:  None. FINDINGS: Acute fracture of the left obturator ring with segmental fracturing of the superior pubic ramus at the body and  puboacetabular junction and comminuted fracturing at the medial inferior ramus. The superior ramus is mildly displaced. No evidence of hip fracture or dislocation. Based on fracture pattern a sacral insufficiency fracture is expected but not visualized. Osteopenia. Left hip osteoarthritis with spurring but no joint narrowing. Calcification from left gluteal tendinitis at the greater trochanter. IMPRESSION: Left obturator ring fractures as described above. Electronically Signed   By: Monte Fantasia M.D.   On: 09/11/2015 12:18    EKG:   Orders placed or performed during the hospital encounter of 05/18/15  . ED EKG within 10 minutes  . ED EKG within 10 minutes  . EKG 12-Lead  . EKG 12-Lead  . EKG 12-Lead  . EKG 12-Lead  . EKG 12-Lead  . EKG 12-Lead  . EKG  12-Lead  . EKG 12-Lead  . EKG    IMPRESSION AND PLAN:   Multiple Pelvic fracture Leukocytosis, possible due to steroids. Hypokalemia Palpitations Sinusitis COPD  The patient will be admitted to medical floor with telemetry monitor. I will get physical therapy evaluation and the social worker consult for placement. Per Dr. Rudene Christians, no surgery indications this time. The patient needs rehabilitation. The patient was given potassium supplement, follow-up BMP and magnesium level. Palpitation, I will continue telemetry monitor and Cardiology consult from Dr. Humphrey Rolls. The patient has been taking prednisone taper, I will continue prednisone. Leukocytosis is possibly due to steroid, but need follow-up chest x-ray and urinalysis. Follow-up CBC. Continue home nebulizer medication.   All the records are reviewed and case discussed with ED provider. Management plans discussed with the patient, family and they are in agreement.  CODE STATUS: DO NOT RESUSCITATE  TOTAL TIME TAKING CARE OF THIS PATIENT: 56 minutes.    Demetrios Loll M.D on 09/11/2015 at 3:10 PM  Between 7am to 6pm - Pager - [phone]  After 6pm go to [email or web address] - password  [facility]  Office  [phone]  CC: Primary care physician; Enid Derry, MD

## 2015-09-11 NOTE — Consult Note (Addendum)
81 yo WF fell at home suffering multiple pelvic fractures.  Unable to stand or bear weight secondary to pain. Xrays show 3 fractures, will need rehab to regain ambulatory status.  Patient has previously been household ambulator. She is uncertain as to why she fell. She has severe pain with any attempted motion of the left lower extremity  Radiographs show ipsilateral left side rami fracture superior and inferior as well as a additional fracture closer to the acetabulum signifying a more significant injury and expect a slow recovery.  Skin is intact around the pelvis she is quite tender to palpation  Impression is multiple pelvic fractures  Plan is start physical therapy weightbearing as tolerated on the left expect her to need rehabilitation to regain ambulatory status

## 2015-09-11 NOTE — ED Provider Notes (Signed)
[facility] Emergency Department Provider Note  ____________________________________________  Time seen: Approximately 1:13 PM  I have reviewed the triage vital signs and the nursing notes.   HISTORY  Chief Complaint Fall and Hip Pain    HPI Marisa Hicks is a 81 y.o. female who lives at home, is on Plavix and aspirin, presenting with left hip pain after fall. Patient reports that over the last few weeks she has been having intermittent palpitations for which she is being worked up by Dr. Yancey Flemings. She is scheduled for an event monitor in the near future. Today she states that she was walking when she had a fall but is not sure why she fell. She cannot give a clear history of a mechanical fall. She denies any associated chest pain, shortness breath, palpitations, lightheadedness, loss of consciousness, syncope. She was unable to get back up due to severe left hip pain.He is also having left elbow swelling and bruising. No known head injury, headache, nausea or vomiting, visual changes.   Past Medical History  Diagnosis Date  . Renal disorder   . Arthritis   . Osteoarthritis   . CHF (congestive heart failure) ([place])   . Dyspnea   . CAD (coronary artery disease)   . COPD (chronic obstructive pulmonary disease) ([place])   . Hypertension   . Hyperlipidemia   . Anxiety   . Allergy   . Osteoporosis   . Proteinuria   . Lymphocytosis   . Dysphagia   . Monocytosis   . Hyponatremia   . Pulmonary hypertension ([place])   . Left ventricular hypertrophy   . Hypertriglyceridemia     Patient Active Problem List   Diagnosis Date Noted  . Acute on chronic respiratory failure ([place]) 05/19/2015  . COPD exacerbation ([place]) 05/16/2015  . Bronchitis, acute, with bronchospasm 05/16/2015  . Cough 05/14/2015  . Dyspnea   . CAD (coronary artery disease)   . COPD (chronic obstructive pulmonary disease) ([place])   . Hypertension   . Hyperlipidemia   . Anxiety   . Allergy   .  Osteoporosis   . Proteinuria   . Lymphocytosis   . Dysphagia   . Monocytosis   . Hyponatremia   . Pulmonary hypertension ([place])   . Left ventricular hypertrophy   . Hypertriglyceridemia     Past Surgical History  Procedure Laterality Date  . Tubal ligation    . Tonsillectomy    . Right knee surgery    . Appendectomy      Current Outpatient Rx  Name  Route  Sig  Dispense  Refill  . acidophilus (RISAQUAD) CAPS capsule   Oral   Take 1 capsule by mouth daily.         Marland Kitchen albuterol (PROVENTIL HFA;VENTOLIN HFA) 108 (90 BASE) MCG/ACT inhaler   Inhalation   Inhale 2 puffs into the lungs every 6 (six) hours as needed for wheezing or shortness of breath.         Marland Kitchen albuterol (PROVENTIL) (2.5 MG/3ML) 0.083% nebulizer solution   Nebulization   Take 3 mLs (2.5 mg total) by nebulization every 4 (four) hours as needed for wheezing or shortness of breath. Do not use with your rescue inhaler; just one or the other   50 mL   1   . ALPRAZolam (XANAX) 0.25 MG tablet   Oral   Take 0.25 mg by mouth at bedtime.          Marland Kitchen amLODipine (NORVASC) 10 MG tablet   Oral  Take 10 mg by mouth daily.         Marland Kitchen amoxicillin-clavulanate (AUGMENTIN) 500-125 MG tablet   Oral   Take 1 tablet by mouth 2 (two) times daily. For 7 days         . aspirin EC 81 MG tablet   Oral   Take 81 mg by mouth daily.         . calcium-vitamin D (OSCAL WITH D) 500-200 MG-UNIT per tablet   Oral   Take 1 tablet by mouth daily.          . Cholecalciferol (VITAMIN D3) 2000 UNITS TABS   Oral   Take 2,000 Units by mouth daily.         . clopidogrel (PLAVIX) 75 MG tablet   Oral   Take 75 mg by mouth daily.         . fexofenadine (ALLEGRA) 180 MG tablet   Oral   Take 180 mg by mouth daily.         . meclizine (ANTIVERT) 25 MG tablet   Oral   Take 25 mg by mouth daily as needed for dizziness.         . Melatonin 3 MG TABS   Oral   Take 3 mg by mouth at bedtime.         . mometasone  (NASONEX) 50 MCG/ACT nasal spray   Nasal   Place 2 sprays into the nose at bedtime.          . Multiple Vitamins-Minerals (MULTIVITAMIN WITH MINERALS) tablet   Oral   Take 1 tablet by mouth daily.         Marland Kitchen olmesartan (BENICAR) 40 MG tablet   Oral   Take 40 mg by mouth daily.         . Omega-3 Fatty Acids (OMEGA-3 FISH OIL) 1200 MG CAPS   Oral   Take 1,200 mg by mouth daily.         . predniSONE (DELTASONE) 20 MG tablet   Oral   Take 20 mg by mouth See admin instructions. 40 mg daily for 5 days, then 20 mg daily for 5 days, then stop         . raloxifene (EVISTA) 60 MG tablet   Oral   Take 60 mg by mouth daily.         . simvastatin (ZOCOR) 40 MG tablet   Oral   Take 40 mg by mouth at bedtime.          . sotalol (BETAPACE) 80 MG tablet   Oral   Take 1 tablet (80 mg total) by mouth every 12 (twelve) hours.   60 tablet   0   . Umeclidinium-Vilanterol (ANORO ELLIPTA) 62.5-25 MCG/INH AEPB   Inhalation   Inhale 1 puff into the lungs daily.         . vitamin C (ASCORBIC ACID) 500 MG tablet   Oral   Take 500 mg by mouth daily.         . chlorpheniramine-HYDROcodone (TUSSIONEX PENNKINETIC ER) 10-8 MG/5ML SUER   Oral   Take 5 mLs by mouth every 12 (twelve) hours as needed for cough. Patient not taking: Reported on 09/11/2015   115 mL   0     Allergies Ace inhibitors; Aliskiren-hydrochlorothiazide; Altace; Atorvastatin; Hydralazine hcl; Hydrochlorothiazide; Irbesartan-hydrochlorothiazide; Labetalol hcl; Rosuvastatin; and Tribenzor  Family History  Problem Relation Age of Onset  . Hypertension Father   . Emphysema Father   . Cancer  Sister     "female" then stomach  . COPD Sister   . Stroke Brother   . COPD Daughter   . Heart disease Paternal Grandfather   . COPD Maternal Grandfather     esophageal    Social History Social History  Substance Use Topics  . Smoking status: Never Smoker   . Smokeless tobacco: Never Used  . Alcohol Use: No     Review of Systems Constitutional: No fever/chills area and no lightheadedness. No syncope. Positive fall, unclear etiology. Eyes: No visual changes. No blurred or double vision. ENT: No sore throat. Cardiovascular: Denies chest pain, palpitations. Respiratory: Denies shortness of breath.  No cough. Gastrointestinal: No abdominal pain.  No nausea, no vomiting.  No diarrhea.  No constipation. PELVIS: Positive left hip pain. Genitourinary: Negative for dysuria. Musculoskeletal: Negative for back pain. Skin: Negative for rash. Neurological: Negative for headaches, focal weakness or numbness.  10-point ROS otherwise negative.  ____________________________________________   PHYSICAL EXAM:  VITAL SIGNS: ED Triage Vitals  Enc Vitals Group     BP 09/11/15 1059 165/99 mmHg     Pulse Rate 09/11/15 1059 77     Resp --      Temp 09/11/15 1059 97.7 F (36.5 C)     Temp Source 09/11/15 1059 Oral     SpO2 09/11/15 1059 99 %     Weight 09/11/15 1059 122 lb (55.339 kg)     Height 09/11/15 1059 4\' 11"  (1.499 m)     Head Cir --      Peak Flow --      Pain Score 09/11/15 1100 2     Pain Loc --      Pain Edu? --      Excl. in [place]? --     Constitutional: She is alert and oriented lying in the stretcher. She is slightly uncomfortable but nontoxic.  Eyes: Conjunctivae are normal.  EOMI. PERRLA. Head: Atraumatic. No raccoon eyes or Battle sign. Nose: No congestion/rhinnorhea. Mouth/Throat: Mucous membranes are moist. No malocclusion or dental injury. Neck: No stridor.  Supple.  Full range of motion without pain. No midline C-spine tenderness to palpation, step-offs or deformities. Cardiovascular: Normal rate, regular rhythm. No murmurs, rubs or gallops.  Respiratory: Normal respiratory effort.  No retractions. Lungs CTAB.  No wheezes, rales or ronchi. Gastrointestinal: Soft and nontender. No distention. No peritoneal signs. PELVIS: Pelvis is stable. Musculoskeletal: No LE edema. Patient  has full range of motion of bilateral knees and ankles without pain. She has also able to range the right hip with minimal pain. She does have pain with range of motion in the left hip. No shortening or external rotation of either leg. Normal DP and PT pulses bilaterally. Left elbow has an area of bruising which is 12 x 6" with significant swelling. She has full range of motion of the left elbow, as well as the left shoulder and wrist without pain. Normal left radial pulse. Neurologic:  Normal speech and language. Face and smile are symmetric. Alert and oriented 3. Moves all extremities well. No gross focal neurologic deficits are appreciated.  Skin:  Skin is warm, dry and intact. No rash noted. Psychiatric: Mood and affect are normal. Speech and behavior are normal.  Normal judgement.  ____________________________________________   LABS (all labs ordered are listed, but only abnormal results are displayed)  Labs Reviewed  CBC - Abnormal; Notable for the following:    WBC 34.1 (*)    Hemoglobin 11.7 (*)  All other components within normal limits  BASIC METABOLIC PANEL - Abnormal; Notable for the following:    Potassium 3.2 (*)    Glucose, Bld 118 (*)    BUN 34 (*)    Calcium 8.5 (*)    GFR calc non Af Amer 59 (*)    All other components within normal limits  TROPONIN I  APTT  PROTIME-INR  URINALYSIS COMPLETEWITH MICROSCOPIC ([facility] ONLY)  TYPE AND SCREEN  ABO/RH   ____________________________________________  EKG  ED ECG REPORT I, Eula Listen, the attending physician, personally viewed and interpreted this ECG.   Date: 09/11/2015  EKG Time: 1058  Rate: 76  Rhythm: normal sinus rhythm  Axis: Leftward  Intervals:first-degree A-V block   ST&T Change: No ST elevation.  ____________________________________________  RADIOLOGY  Dg Elbow Complete Left  09/11/2015  CLINICAL DATA:  Recent fall while using walker with left elbow pain, initial encounter EXAM: LEFT  ELBOW - COMPLETE 3+ VIEW COMPARISON:  None. FINDINGS: No acute fracture or dislocation is noted. No significant joint effusion is seen. Soft tissue swelling is noted along the posterior aspect of the proximal forearm and elbow consistent with hematoma from the recent injury IMPRESSION: Soft tissue injury without acute bony abnormality. Electronically Signed   By: Inez Catalina M.D.   On: 09/11/2015 14:19   Dg Hip Unilat With Pelvis 2-3 Views Left  09/11/2015  CLINICAL DATA:  Fall with left hip pain.  Initial encounter. EXAM: DG HIP (WITH OR WITHOUT PELVIS) 2-3V LEFT COMPARISON:  None. FINDINGS: Acute fracture of the left obturator ring with segmental fracturing of the superior pubic ramus at the body and puboacetabular junction and comminuted fracturing at the medial inferior ramus. The superior ramus is mildly displaced. No evidence of hip fracture or dislocation. Based on fracture pattern a sacral insufficiency fracture is expected but not visualized. Osteopenia. Left hip osteoarthritis with spurring but no joint narrowing. Calcification from left gluteal tendinitis at the greater trochanter. IMPRESSION: Left obturator ring fractures as described above. Electronically Signed   By: Monte Fantasia M.D.   On: 09/11/2015 12:18    ____________________________________________   PROCEDURES  Procedure(s) performed: None  Critical Care performed: No ____________________________________________   INITIAL IMPRESSION / ASSESSMENT AND PLAN / ED COURSE  Pertinent labs & imaging results that were available during my care of the patient were reviewed by me and considered in my medical decision making (see chart for details).  81 y.o. female presenting with a fall and associated left hip pain and left elbow bruising and pain. The etiology of her fall is somewhat unclear and she has recently been under evaluation for palpitations. I will plan to x-ray her hip, her elbow, and do basic blood work looking for  other sources of palpations.  ----------------------------------------- 1:24 PM on 09/11/2015 -----------------------------------------  The patient does have an acute left obturator ring fracture in this superior ramus, or so has been consulted.  ----------------------------------------- 1:39 PM on 09/11/2015 -----------------------------------------  I have talked with Dr. Rudene Christians, the orthopedist on-call, who corroborates that this is a non-surgical fracture and that the patient will be weightbearing as tolerated. I'm awaiting lab test results and anticipate admission to the hospital for palpitations.  ----------------------------------------- 2:37 PM on 09/11/2015 -----------------------------------------  The patient's labs are reassuring with a negative troponin, mild hypokalemia. The patient does have a white blood cell count of 34, side added a chest x-ray as well as urinalysis for evaluation is possible that she has a bump due to her fractures but  I want to make sure she does not have any other causes. Her elbow x-rays also negative for acute trauma. Plan admission to the hospital.  ____________________________________________  FINAL CLINICAL IMPRESSION(S) / ED DIAGNOSES  Final diagnoses:  Fracture of left pelvis, closed, initial encounter ([place])  Fall, initial encounter  Palpitations  Left elbow contusion, initial encounter      NEW MEDICATIONS STARTED DURING THIS VISIT:  New Prescriptions   No medications on file     Eula Listen, MD 09/11/15 1438

## 2015-09-12 ENCOUNTER — Inpatient Hospital Stay: Payer: Medicare Other

## 2015-09-12 LAB — CBC
HCT: 33.1 % — ABNORMAL LOW (ref 35.0–47.0)
Hemoglobin: 10.9 g/dL — ABNORMAL LOW (ref 12.0–16.0)
MCH: 29.7 pg (ref 26.0–34.0)
MCHC: 32.9 g/dL (ref 32.0–36.0)
MCV: 90.1 fL (ref 80.0–100.0)
PLATELETS: 276 10*3/uL (ref 150–440)
RBC: 3.68 MIL/uL — ABNORMAL LOW (ref 3.80–5.20)
RDW: 14 % (ref 11.5–14.5)
WBC: 17.2 10*3/uL — ABNORMAL HIGH (ref 3.6–11.0)

## 2015-09-12 LAB — BASIC METABOLIC PANEL
Anion gap: 7 (ref 5–15)
BUN: 26 mg/dL — ABNORMAL HIGH (ref 6–20)
CALCIUM: 8.4 mg/dL — AB (ref 8.9–10.3)
CHLORIDE: 107 mmol/L (ref 101–111)
CO2: 23 mmol/L (ref 22–32)
CREATININE: 0.72 mg/dL (ref 0.44–1.00)
GFR calc Af Amer: >60 mL/min (ref >60)
GFR calc non Af Amer: >60 mL/min (ref >60)
GLUCOSE: 173 mg/dL — AB (ref 65–99)
Potassium: 5 mmol/L (ref 3.5–5.1)
Sodium: 137 mmol/L (ref 135–145)

## 2015-09-12 MED ORDER — STROKE: EARLY STAGES OF RECOVERY BOOK
Freq: Once | Status: AC
  Administered 2015-09-12: 22:00:00

## 2015-09-12 MED ORDER — ASPIRIN EC 81 MG PO TBEC
81.0000 mg | DELAYED_RELEASE_TABLET | Freq: Every day | ORAL | Status: DC
  Administered 2015-09-13 – 2015-09-15 (×3): 81 mg via ORAL
  Filled 2015-09-12 (×3): qty 1

## 2015-09-12 MED ORDER — UMECLIDINIUM-VILANTEROL 62.5-25 MCG/INH IN AEPB
INHALATION_SPRAY | Freq: Every day | RESPIRATORY_TRACT | Status: DC
  Administered 2015-09-13 – 2015-09-15 (×3): via RESPIRATORY_TRACT
  Filled 2015-09-12 (×2): qty 1

## 2015-09-12 MED ORDER — LIDOCAINE 5 % EX PTCH
1.0000 | MEDICATED_PATCH | CUTANEOUS | Status: DC
  Administered 2015-09-12 – 2015-09-15 (×4): 1 via TRANSDERMAL
  Filled 2015-09-12 (×6): qty 1

## 2015-09-12 MED ORDER — CLOPIDOGREL BISULFATE 75 MG PO TABS
75.0000 mg | ORAL_TABLET | Freq: Every day | ORAL | Status: DC
  Administered 2015-09-13 – 2015-09-15 (×3): 75 mg via ORAL
  Filled 2015-09-12 (×3): qty 1

## 2015-09-12 NOTE — Progress Notes (Signed)
[facility] [place] NAME: Marisa Hicks    MR#:  [id number]  DATE OF BIRTH:  1922-08-16  SUBJECTIVE:   Patient is complaining of pelvic pain.  REVIEW OF SYSTEMS:    Review of Systems  Constitutional: Negative for fever, chills and malaise/fatigue.  HENT: Negative for sore throat.   Eyes: Negative for blurred vision.  Respiratory: Negative for cough, hemoptysis, shortness of breath and wheezing.   Cardiovascular: Negative for chest pain, palpitations and leg swelling.  Gastrointestinal: Negative for nausea, vomiting, abdominal pain, diarrhea and blood in stool.  Genitourinary: Negative for dysuria.  Musculoskeletal: Positive for joint pain and falls. Negative for back pain.  Neurological: Positive for tingling and focal weakness. Negative for dizziness, tremors, sensory change and headaches.  Endo/Heme/Allergies: Does not bruise/bleed easily.    Tolerating Diet: Yes      DRUG ALLERGIES:   Allergies  Allergen Reactions  . Ace Inhibitors   . Aliskiren-Hydrochlorothiazide Other (See Comments)  . Altace [Ramipril] Other (See Comments)    angioedema  . Atorvastatin Other (See Comments)  . Hydralazine Hcl   . Hydrochlorothiazide Other (See Comments)    hyponatremia  . Irbesartan-Hydrochlorothiazide Other (See Comments)  . Labetalol Hcl   . Rosuvastatin Other (See Comments)  . Tribenzor [Olmesartan-Amlodipine-Hctz] Other (See Comments)    Low sodium    VITALS:  Blood pressure 152/63, pulse 65, temperature 98.3 F (36.8 C), temperature source Oral, resp. rate 18, height 4\' 11"  (1.499 m), weight 55.339 kg (122 lb), SpO2 93 %.  PHYSICAL EXAMINATION:   Physical Exam  Constitutional: She is oriented to person, place, and time and well-developed, well-nourished, and in no distress. No distress.  HENT:  Head: Normocephalic.  Eyes: No scleral icterus.  Neck: Normal range of motion. Neck supple. No JVD present. No tracheal  deviation present.  Cardiovascular: Normal rate, regular rhythm and normal heart sounds.  Exam reveals no gallop and no friction rub.   No murmur heard. Pulmonary/Chest: Effort normal and breath sounds normal. No respiratory distress. She has no wheezes. She has no rales. She exhibits no tenderness.  Abdominal: Soft. Bowel sounds are normal. She exhibits no distension and no mass. There is no tenderness. There is no rebound and no guarding.  Musculoskeletal: Normal range of motion. She exhibits no edema.  Neurological: She is alert and oriented to person, place, and time.  Skin: Skin is warm. No rash noted. No erythema.  Psychiatric: Affect and judgment normal.      LABORATORY PANEL:   CBC  Recent Labs Lab 09/12/15 0647  WBC 17.2*  HGB 10.9*  HCT 33.1*  PLT 276   ------------------------------------------------------------------------------------------------------------------  Chemistries   Recent Labs Lab 09/11/15 1903 09/12/15 0524  NA  --  137  K  --  5.0  CL  --  107  CO2  --  23  GLUCOSE  --  173*  BUN  --  26*  CREATININE  --  0.72  CALCIUM  --  8.4*  MG 1.7  --    ------------------------------------------------------------------------------------------------------------------  Cardiac Enzymes  Recent Labs Lab 09/11/15 1346  TROPONINI <0.03   ------------------------------------------------------------------------------------------------------------------  RADIOLOGY:  Dg Chest 2 View  09/11/2015  CLINICAL DATA:  Pain posterior chest.  Fall this morning.  COPD. EXAM: CHEST  2 VIEW COMPARISON:  Radiograph 05/22/2015 FINDINGS: Normal cardiac silhouette. Aorta is ectatic. There is chronic bronchitic markings again noted. No effusion, infiltrate or pneumothorax. Lungs are hyperinflated. Degenerative osteophytosis of the thoracic  spine. IMPRESSION: Hyperinflated lungs without acute findings. Electronically Signed   By: Suzy Bouchard M.D.   On: 09/11/2015  14:56   Dg Elbow Complete Left  09/11/2015  CLINICAL DATA:  Recent fall while using walker with left elbow pain, initial encounter EXAM: LEFT ELBOW - COMPLETE 3+ VIEW COMPARISON:  None. FINDINGS: No acute fracture or dislocation is noted. No significant joint effusion is seen. Soft tissue swelling is noted along the posterior aspect of the proximal forearm and elbow consistent with hematoma from the recent injury IMPRESSION: Soft tissue injury without acute bony abnormality. Electronically Signed   By: Inez Catalina M.D.   On: 09/11/2015 14:19   Dg Hip Unilat With Pelvis 2-3 Views Left  09/11/2015  CLINICAL DATA:  Fall with left hip pain.  Initial encounter. EXAM: DG HIP (WITH OR WITHOUT PELVIS) 2-3V LEFT COMPARISON:  None. FINDINGS: Acute fracture of the left obturator ring with segmental fracturing of the superior pubic ramus at the body and puboacetabular junction and comminuted fracturing at the medial inferior ramus. The superior ramus is mildly displaced. No evidence of hip fracture or dislocation. Based on fracture pattern a sacral insufficiency fracture is expected but not visualized. Osteopenia. Left hip osteoarthritis with spurring but no joint narrowing. Calcification from left gluteal tendinitis at the greater trochanter. IMPRESSION: Left obturator ring fractures as described above. Electronically Signed   By: Monte Fantasia M.D.   On: 09/11/2015 12:18     ASSESSMENT AND PLAN:   81 year old female status post fall who presented with pelvic pain and found to have multiple pelvic fractures.  1. Gait instability: Physical therapy called me after patient was evaluated and the physical therapist felt the patient was leaning more to his left side. He was concerned the patient could have had a stroke. I discussed this with the patient's family and will order MRI. She is on aspirin and Plavix. I will order neuro checks every 4 hours. Continue telemetry monitoring. 2. Multiple pelvic fractures  after fall: Continue supportive pain management. Patient will likely need skilled nursing facility.  3. Leukocytosis: . This was likely elevated due to acute phase reactant and/or steroids.. Chest x-ray shows no acute infiltrate. White blood cell count has improved.  4. Palpitation: Cardiology consultation ordered. Telemetry reveals no arrhythmia.  5. Sinusitis: Continue prednisone taper.  Management plans discussed with the patient and she is in agreement.  CODE STATUS: DNR  TOTAL TIME TAKING CARE OF THIS PATIENT: 30 minutes.   Greater than 50% time spent in care and coordination talking with physical therapist, family and nurse.  POSSIBLE D/C 2-3 days, DEPENDING ON CLINICAL CONDITION.   Jeremyah Jelley M.D on 09/12/2015 at 10:31 AM  Between 7am to 6pm - Pager - [phone] After 6pm go to [email or web address] - password [email or web address] [facility]  Office  [phone]  CC: Primary care physician; Enid Derry, MD  Note: This dictation was prepared with Dragon dictation along with smaller phrase technology. Any transcriptional errors that result from this process are unintentional.

## 2015-09-12 NOTE — Care Management Note (Signed)
Case Management Note  Patient Details  Name: Marisa Hicks MRN: [id number] Date of Birth: December 10, 1921  Subjective/Objective:                  Met with patient and her daughter [name] [phone]. Patient has 5 children- 3 of which shared heath care POA. [name] stressed the desire to be contacted with any updated discharge needs. She is from home alone where she was intermittently dependent on a cane or a rolling walker. She fell at home yesterday- new left side weakness per daughter. Her PCP is with [facility]. She has a history of using [facility] for [facility] health. [name] and patient agree to SNF. [facility] (purse) through [facility]. She uses [facility] for Rx.  Action/Plan:  List of home health agencies and my contact card left with patient and her daughter [name]. Buddy Duty to not hesitate to call CM with any questions related to discharge as we may not see her daily- she agreed. This RNCM asked that one person remain the contact person for the patient and she agreed. They can discuss decision for discharge among themselves. I have notified Mel Almond CSW of patient/Paula's request for SNF. RNCM will continue to follow.   Expected Discharge Date:  09/14/15               Expected Discharge Plan:     In-House Referral:     Discharge planning Services  CM Consult  Post Acute Care Choice:  Home Health Choice offered to:     DME Arranged:    DME Agency:     HH Arranged:    HH Agency:     Status of Service:  In process, will continue to follow  Medicare Important Message Given:    Date Medicare IM Given:    Medicare IM give by:    Date Additional Medicare IM Given:    Additional Medicare Important Message give by:     If discussed at [place] of Stay Meetings, dates discussed:    Additional Comments:  Marshell Garfinkel, RN 09/12/2015, 10:53 AM

## 2015-09-12 NOTE — Progress Notes (Signed)
Radiology called to notify nursing of MRI results.  MD Mody ordered for neurology to be consulted

## 2015-09-12 NOTE — Progress Notes (Signed)
MD notified about pt taking own inhaler. Order placed by MD. Med sent to pharmacy for verification.

## 2015-09-12 NOTE — Evaluation (Signed)
Physical Therapy Evaluation Patient Details Name: Marisa Hicks MRN: [id number] DOB: 01-22-1922 Today's Date: 09/12/2015   History of Present Illness  Pt had a fall while using walker suffering L pelvic fracture and L side rib pain.  Clinical Impression  Pt has some minimal pain, but actually is not too pain limited.  She displays L U&LE weakness and some decreased coordination and has heavy L lean with sitting and especially with standing.  Pt struggles with ambulation effort and is completely unable to return home at this time.  She is very limited (L more than R) with ability to participate with 10 minutes of bed exercises apart from eval, but does show good effort.      Follow Up Recommendations SNF    Equipment Recommendations       Recommendations for Other Services       Precautions / Restrictions Precautions Precautions: Fall Restrictions Weight Bearing Restrictions: Yes LLE Weight Bearing: Weight bearing as tolerated      Mobility  Bed Mobility Overal bed mobility: Needs Assistance Bed Mobility: Supine to Sit     Supine to sit: Max assist     General bed mobility comments: Pt leaning to the L with sitting and needs assist just to get to upright sitting and generally is unable to maintain balance  Transfers Overall transfer level: Needs assistance Equipment used: Rolling walker (2 wheeled) Transfers: Sit to/from Stand Sit to Stand: Max assist         General transfer comment: Pt needs assist to get to standing and then, with leaning heavily to the L she needs max assist just to remain upright.   Ambulation/Gait Ambulation/Gait assistance: Max assist Ambulation Distance (Feet): 3 Feet Assistive device: Rolling walker (2 wheeled)       General Gait Details: Pt is able to attempt some minimal stepping but is heavily leaning to the L and shows very little ability to move either LE, some issues appear to arise from pain but she also seems to have some  coordination issues  Stairs            Wheelchair Mobility    Modified Rankin (Stroke Patients Only)       Balance Overall balance assessment: Needs assistance     Sitting balance - Comments: Pt leaning to the L in sitting and needs assist to sit partially upright       Standing balance comment: Pt heavily leaning L and needing signficant assist just to stay upright                             Pertinent Vitals/Pain Pain Assessment:  (no pain at rest, moderate pain during activity)    Home Living Family/patient expects to be discharged to:: Private residence Living Arrangements: Alone (had help 3x/wk and daughter 2x/wk) Available Help at Discharge: Family Type of Home: House Home Access: Level entry       Home Equipment: Environmental consultant - 2 wheels;Cane - single point Additional Comments: Home O2 (3 L/min via nasal cannula during day--usually uses with exertion); CPAP at night    Prior Function Level of Independence: Independent with assistive device(s)         Comments: Pt has been able to get around, but needing ADs more regularly      Hand Dominance        Extremity/Trunk Assessment   Upper Extremity Assessment: LUE deficits/detail       LUE Deficits /  Details: Pt with considerable weakness with L UE activity as well as some coordination issues that do not appear related to pain.     Lower Extremity Assessment: LLE deficits/detail   LLE Deficits / Details: Pt with considerable weakness with L hip and LE acts.  She does not c/o severe pain with the effort but is uncomfortable and has almost no AROM in L, R LE grossly 3+ to 4-/5     Communication   Communication: No difficulties  Cognition Arousal/Alertness: Awake/alert   Overall Cognitive Status: Within Functional Limits for tasks assessed                      General Comments      Exercises General Exercises - Lower Extremity Ankle Circles/Pumps: AROM;Both;10 reps Heel  Slides: AROM;5 reps;Both;AAROM Hip ABduction/ADduction: AAROM;5 reps;Both      Assessment/Plan    PT Assessment Patient needs continued PT services  PT Diagnosis Difficulty walking;Generalized weakness;Acute pain   PT Problem List Decreased strength;Decreased balance;Decreased coordination;Decreased mobility;Decreased range of motion;Decreased activity tolerance;Decreased knowledge of use of DME;Decreased safety awareness  PT Treatment Interventions DME instruction;Gait training;Stair training;Therapeutic activities;Functional mobility training;Therapeutic exercise;Balance training;Neuromuscular re-education;Cognitive remediation;Patient/family education   PT Goals (Current goals can be found in the Care Plan section) Acute Rehab PT Goals Patient Stated Goal: To go to rehab PT Goal Formulation: With patient/family Time For Goal Achievement: 09/26/15 Potential to Achieve Goals: Good    Frequency 7X/week   Barriers to discharge        Co-evaluation               End of Session Equipment Utilized During Treatment: Gait belt Activity Tolerance: Patient limited by pain Patient left: with chair alarm set;with call bell/phone within reach;with family/visitor present Nurse Communication: Mobility status (possibility to neuro deficits)         Time: [id number] PT Time Calculation (min) (ACUTE ONLY): 29 min   Charges:   PT Evaluation $Initial PT Evaluation Tier I: 1 Procedure PT Treatments $Therapeutic Exercise: 8-22 mins   PT G Codes:       Wayne Both, PT, DPT [phone]  Kreg Shropshire 09/12/2015, 10:38 AM

## 2015-09-12 NOTE — NC FL2 (Signed)
[place] LEVEL OF CARE SCREENING TOOL     IDENTIFICATION  Patient Name: Marisa Hicks Birthdate: December 25, 1921 Sex: female Admission Date (Current Location): 09/11/2015  [place] and [place] Number:  [facility] )   Facility and Address:  [facility], [address], [place], [place] [address]      Provider Number: [id number]  Attending Physician Name and Address:  Bettey Costa, MD  Relative Name and Phone Number:       Current Level of Care: Hospital Recommended Level of Care: [place] Prior Approval Number:    Date Approved/Denied:   PASRR Number:  [id number])  Discharge Plan: SNF    Current Diagnoses: Patient Active Problem List   Diagnosis Date Noted  . Pelvic fracture, closed, initial encounter 09/11/2015  . Acute on chronic respiratory failure ([place]) 05/19/2015  . COPD exacerbation ([place]) 05/16/2015  . Bronchitis, acute, with bronchospasm 05/16/2015  . Cough 05/14/2015  . Dyspnea   . CAD (coronary artery disease)   . COPD (chronic obstructive pulmonary disease) ([place])   . Hypertension   . Hyperlipidemia   . Anxiety   . Allergy   . Osteoporosis   . Proteinuria   . Lymphocytosis   . Dysphagia   . Monocytosis   . Hyponatremia   . Pulmonary hypertension ([place])   . Left ventricular hypertrophy   . Hypertriglyceridemia     Orientation RESPIRATION BLADDER Height & Weight    Self, Time, Situation, Place  Normal Continent 4\' 11"  (149.9 cm) 122 lbs.  BEHAVIORAL SYMPTOMS/MOOD NEUROLOGICAL BOWEL NUTRITION STATUS   (none )  (none ) Continent Diet (Diet: Heart Healthy )  AMBULATORY STATUS COMMUNICATION OF NEEDS Skin   Extensive Assist Verbally Normal                       Personal Care Assistance Level of Assistance  Bathing, Feeding, Dressing Bathing Assistance: Limited assistance Feeding assistance: Limited assistance Dressing Assistance: Limited assistance     Functional Limitations Info   Sight, Hearing, Speech Sight Info: Adequate Hearing Info: Adequate Speech Info: Adequate    SPECIAL CARE FACTORS FREQUENCY  PT (By licensed PT), OT (By licensed OT)     PT Frequency:  (5) OT Frequency:  (5)            Contractures      Additional Factors Info  Code Status, Allergies Code Status Info:  (DNR) Allergies Info:  (Ace Inhibitors, Aliskiren-hydrochlorothiazide, Altace Ramipril , Atorvastatin, Hydralazine Hcl, Hydrochlorothiazide, Irbesartan-hydrochlorothiazide, Labetalol Hcl, Rosuvastatin, Tribenzor Olmesartan-amlodipine-hctz)           Current Medications (09/12/2015):  This is the current hospital active medication list Current Facility-Administered Medications  Medication Dose Route Frequency Provider Last Rate Last Dose  . acetaminophen (TYLENOL) tablet 650 mg  650 mg Oral Q6H PRN Demetrios Loll, MD       Or  . acetaminophen (TYLENOL) suppository 650 mg  650 mg Rectal Q6H PRN Demetrios Loll, MD      . acidophilus (RISAQUAD) capsule 1 capsule  1 capsule Oral Daily Demetrios Loll, MD   1 capsule at 09/12/15 0745  . albuterol (PROVENTIL) (2.5 MG/3ML) 0.083% nebulizer solution 2.5 mg  2.5 mg Nebulization Q2H PRN Demetrios Loll, MD      . ALPRAZolam Duanne Moron) tablet 0.25 mg  0.25 mg Oral QHS Demetrios Loll, MD   0.25 mg at 09/11/15 1801  . amLODipine (NORVASC) tablet 10 mg  10 mg Oral Daily Demetrios Loll, MD  10 mg at 09/12/15 0745  . aspirin EC tablet 81 mg  81 mg Oral Daily Demetrios Loll, MD   81 mg at 09/12/15 0744  . calcium-vitamin D (OSCAL WITH D) 500-200 MG-UNIT per tablet 1 tablet  1 tablet Oral Daily Demetrios Loll, MD   1 tablet at 09/12/15 0745  . cholecalciferol (VITAMIN D) tablet 2,000 Units  2,000 Units Oral Daily Demetrios Loll, MD   2,000 Units at 09/12/15 0744  . clopidogrel (PLAVIX) tablet 75 mg  75 mg Oral Daily Demetrios Loll, MD   75 mg at 09/12/15 0744  . fluticasone (FLONASE) 50 MCG/ACT nasal spray 1 spray  1 spray Each Nare Daily Demetrios Loll, MD   1 spray at 09/11/15 1821  . irbesartan  (AVAPRO) tablet 300 mg  300 mg Oral Daily Demetrios Loll, MD   300 mg at 09/12/15 0745  . lidocaine (LIDODERM) 5 % 1 patch  1 patch Transdermal Q24H Bettey Costa, MD   1 patch at 09/12/15 0743  . loratadine (CLARITIN) tablet 10 mg  10 mg Oral Daily Demetrios Loll, MD   10 mg at 09/12/15 0745  . meclizine (ANTIVERT) tablet 25 mg  25 mg Oral Daily PRN Demetrios Loll, MD      . morphine 2 MG/ML injection 2 mg  2 mg Intravenous Q4H PRN Demetrios Loll, MD   2 mg at 09/11/15 1800  . ondansetron (ZOFRAN) tablet 4 mg  4 mg Oral Q6H PRN Demetrios Loll, MD       Or  . ondansetron [facility]) injection 4 mg  4 mg Intravenous Q6H PRN Demetrios Loll, MD      . oxyCODONE-acetaminophen (PERCOCET/ROXICET) 5-325 MG per tablet 1 tablet  1 tablet Oral Q4H PRN Demetrios Loll, MD   1 tablet at 09/12/15 1025  . predniSONE (DELTASONE) tablet 20 mg  20 mg Oral Q breakfast Demetrios Loll, MD   20 mg at 09/12/15 0744  . raloxifene (EVISTA) tablet 60 mg  60 mg Oral Daily Demetrios Loll, MD   60 mg at 09/12/15 1033  . salmeterol (SEREVENT) diskus inhaler 1 puff  1 puff Inhalation BID Demetrios Loll, MD   1 puff at 09/11/15 2140  . simvastatin (ZOCOR) tablet 40 mg  40 mg Oral QHS Demetrios Loll, MD   40 mg at 09/11/15 2139  . sotalol (BETAPACE) tablet 80 mg  80 mg Oral Q12H Demetrios Loll, MD   80 mg at 09/12/15 0746  . tiotropium (SPIRIVA) inhalation capsule 18 mcg  18 mcg Inhalation Daily Demetrios Loll, MD   18 mcg at 09/11/15 2139     Discharge Medications: Please see discharge summary for a list of discharge medications.  Relevant Imaging Results:  Relevant Lab Results:   Additional Information  (SSN: [id number])  Loralyn Freshwater, LCSW

## 2015-09-12 NOTE — Progress Notes (Signed)
Notified MD of stroke protocol orders needing to be placed.

## 2015-09-12 NOTE — Clinical Social Work Placement (Signed)
   CLINICAL SOCIAL WORK PLACEMENT  NOTE  Date:  09/12/2015  Patient Details  Name: Marisa Hicks MRN: [id number] Date of Birth: 1922/02/24  Clinical Social Work is seeking post-discharge placement for this patient at the [place] level of care (*CSW will initial, date and re-position this form in  chart as items are completed):  Yes   Patient/family provided with [place] Work Department's list of facilities offering this level of care within the geographic area requested by the patient (or if unable, by the patient's family).  Yes   Patient/family informed of their freedom to choose among providers that offer the needed level of care, that participate in Medicare, Medicaid or managed care program needed by the patient, have an available bed and are willing to accept the patient.  Yes   Patient/family informed of [facility]'s ownership interest in [facility] and [facility], as well as of the fact that they are under no obligation to receive care at these facilities.  PASRR submitted to EDS on 09/12/15     PASRR number received on 09/12/15     Existing PASRR number confirmed on       FL2 transmitted to all facilities in geographic area requested by pt/family on 09/12/15     FL2 transmitted to all facilities within larger geographic area on 09/12/15     Patient informed that his/her managed care company has contracts with or will negotiate with certain facilities, including the following:            Patient/family informed of bed offers received.  Patient chooses bed at       Physician recommends and patient chooses bed at      Patient to be transferred to   on  .  Patient to be transferred to facility by       Patient family notified on   of transfer.  Name of family member notified:        PHYSICIAN       Additional Comment:    _______________________________________________ Loralyn Freshwater, LCSW 09/12/2015, 3:18 PM

## 2015-09-12 NOTE — Progress Notes (Signed)
MRI shows acute CVA nonhemorrhagic right lentiform and posterior limb. 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 Carotids show 50-69% right internal carotid artery.  Patient already on ASA/PLAVIX  Relatively asymptomatic. Continue asa,plavix and statin.  VASC and NEURO consults in am. LIPID panel and ECHO with bubble in am. CONT NEURO checks.  D/w nurse and patient's son Sent message to Dr Tamala Julian   TIME 28 minutes

## 2015-09-12 NOTE — Clinical Social Work Note (Signed)
Clinical Social Work Assessment  Patient Details  Name: Marisa Hicks MRN: [id number] Date of Birth: Mar 30, 1922  Date of referral:  09/12/15               Reason for consult:  Facility Placement                Permission sought to share information with:  Chartered certified accountant granted to share information::  Yes, Verbal Permission Granted  Name::      [place]::   [facility]   Relationship::     Contact Information:     Housing/Transportation Living arrangements for the past 2 months:  [place] of Information:  Patient, Adult Children Patient Interpreter Needed:  None Criminal Activity/Legal Involvement Pertinent to Current Situation/Hospitalization:  No - Comment as needed Significant Relationships:  Adult Children Lives with:  Self Do you feel safe going back to the place where you live?  Yes Need for family participation in patient care:  Yes (Comment)  Care giving concerns: Patient lives alone in [place].    Social Worker assessment / plan: [place] representative (CSW) received SNF consult. PT is recommending SNF. CSW met with patient and her daughter Marisa Hicks [phone] was at bedside. Patient was sitting up in the chair and was alert and oriented. CSW introduced self and explained role of CSW department. Patient reported that she lives in [place] alone. Per daughter patient needs short term rehab and then will transition to long term care. Daughter requested SNF referral be sent to [facility]. Per daughter there are 4 children and [facility] is a central location. CSW explained that patient's Medicare will pay for days 1-20 at 100% and days 21-100 at 80% if patient has a 3 night qualifying inpatient stay. CSW also explained private pay vs. Medicaid. Daughter reported that she would go to DSS in [facility] Monday morning and apply for Medicaid. Daughter reported that [employer], [place] [facility] and [facility] are her preferences.   FL2 complete and faxed out. Patient will likely D/C Monday pending medical clearance.   Employment status:  Disabled (Comment on whether or not currently receiving Disability), Retired Forensic scientist:  Medicare PT Recommendations:  [place] / Referral to community resources:  [place]  Patient/Family's Response to care: Patient and daughter are agreeable to SNF search in [place].   Patient/Family's Understanding of and Emotional Response to Diagnosis, Current Treatment, and Prognosis: Patient and daughter were pleasant throughout assessment.   Emotional Assessment Appearance:  Appears stated age Attitude/Demeanor/Rapport:    Affect (typically observed):  Accepting, Adaptable, Pleasant Orientation:  Oriented to Self, Oriented to Place, Oriented to  Time, Oriented to Situation Alcohol / Substance use:  Not Applicable Psych involvement (Current and /or in the community):  No (Comment)  Discharge Needs  Concerns to be addressed:  Discharge Planning Concerns Readmission within the last 30 days:  No Current discharge risk:  Dependent with Mobility Barriers to Discharge:  Continued Medical Work up   Loralyn Freshwater, LCSW 09/12/2015, 3:20 PM

## 2015-09-12 NOTE — Progress Notes (Signed)
Patient is being moved to 1C d/t stroke.

## 2015-09-13 ENCOUNTER — Inpatient Hospital Stay
Admit: 2015-09-13 | Discharge: 2015-09-13 | Disposition: A | Discharge status: Home / Self Care | Payer: Medicare Other | Attending: Internal Medicine | Admitting: Internal Medicine

## 2015-09-13 ENCOUNTER — Encounter: Payer: Self-pay | Admitting: Family Medicine

## 2015-09-13 DIAGNOSIS — I6521 Occlusion and stenosis of right carotid artery: Secondary | ICD-10-CM | POA: Insufficient documentation

## 2015-09-13 DIAGNOSIS — I639 Cerebral infarction, unspecified: Secondary | ICD-10-CM | POA: Insufficient documentation

## 2015-09-13 DIAGNOSIS — I6522 Occlusion and stenosis of left carotid artery: Secondary | ICD-10-CM | POA: Insufficient documentation

## 2015-09-13 LAB — CBC
HCT: 32.6 % — ABNORMAL LOW (ref 35.0–47.0)
Hemoglobin: 10.9 g/dL — ABNORMAL LOW (ref 12.0–16.0)
MCH: 30.3 pg (ref 26.0–34.0)
MCHC: 33.3 g/dL (ref 32.0–36.0)
MCV: 90.8 fL (ref 80.0–100.0)
Platelets: 253 10*3/uL (ref 150–440)
RBC: 3.59 MIL/uL — ABNORMAL LOW (ref 3.80–5.20)
RDW: 13.7 % (ref 11.5–14.5)
WBC: 17.9 10*3/uL — ABNORMAL HIGH (ref 3.6–11.0)

## 2015-09-13 LAB — LIPID PANEL
CHOL/HDL RATIO: 2.8 ratio
Cholesterol: 169 mg/dL (ref 0–200)
HDL: 60 mg/dL (ref >40)
LDL CALC: 63 mg/dL (ref 0–99)
Triglycerides: 228 mg/dL — ABNORMAL HIGH (ref <150)
VLDL: 46 mg/dL — AB (ref 0–40)

## 2015-09-13 LAB — BASIC METABOLIC PANEL
Anion gap: 4 — ABNORMAL LOW (ref 5–15)
BUN: 26 mg/dL — AB (ref 6–20)
CHLORIDE: 102 mmol/L (ref 101–111)
CO2: 27 mmol/L (ref 22–32)
CREATININE: 0.75 mg/dL (ref 0.44–1.00)
Calcium: 8.3 mg/dL — ABNORMAL LOW (ref 8.9–10.3)
GFR calc Af Amer: >60 mL/min (ref >60)
GFR calc non Af Amer: >60 mL/min (ref >60)
GLUCOSE: 128 mg/dL — AB (ref 65–99)
POTASSIUM: 4 mmol/L (ref 3.5–5.1)
SODIUM: 133 mmol/L — AB (ref 135–145)

## 2015-09-13 LAB — HEMOGLOBIN A1C: HEMOGLOBIN A1C: 6.1 % — AB (ref 4.0–6.0)

## 2015-09-13 NOTE — Plan of Care (Signed)
Problem: Education: Goal: Knowledge of disease or condition will improve Outcome: Progressing Stroke booklet given-continue to review with patient and family when present  Problem: Self-Care: Goal: Ability to participate in self-care as condition permits will improve Outcome: Progressing Continues to need assistance with movement  Problem: Tissue Perfusion: Goal: Complications of Ischemic Stroke will be minimized. Outcome: Progressing No change in neurological signs

## 2015-09-13 NOTE — Consult Note (Signed)
[facility] Cardiology  CARDIOLOGY CONSULT NOTE  Patient ID: LAJUAN Hicks MRN: [id number] DOB/AGE: Aug 04, 1922 81 y.o.  Admit date: 09/11/2015 Referring Physician Mody Primary Physician Lada Primary Cardiologist Humphrey Rolls Reason for Consultation palpitations  HPI: 81 year old female admitted after falling, and have multiple fractures, as well as acute stroke involving right lentiform nucleus and posterior limb internal capsule, defer for evaluation of plates of palpitations. Telemetry reveals normal sinus rhythm out any ectopy. History of supraventricular tachycardia on sotalol. She also reports a history of coronary artery disease, status post stents, currently on aspirin and Plavix.  Review of systems complete and found to be negative unless listed above     Past Medical History  Diagnosis Date  . Renal disorder   . Arthritis   . Osteoarthritis   . CHF (congestive heart failure) ([place])   . Dyspnea   . CAD (coronary artery disease)   . COPD (chronic obstructive pulmonary disease) ([place])   . Hypertension   . Hyperlipidemia   . Anxiety   . Allergy   . Osteoporosis   . Proteinuria   . Lymphocytosis   . Dysphagia   . Monocytosis   . Hyponatremia   . Pulmonary hypertension ([place])   . Left ventricular hypertrophy   . Hypertriglyceridemia     Past Surgical History  Procedure Laterality Date  . Tubal ligation    . Tonsillectomy    . Right knee surgery    . Appendectomy      Prescriptions prior to admission  Medication Sig Dispense Refill Last Dose  . acidophilus (RISAQUAD) CAPS capsule Take 1 capsule by mouth daily.   09/10/2015 at am  . albuterol (PROVENTIL HFA;VENTOLIN HFA) 108 (90 BASE) MCG/ACT inhaler Inhale 2 puffs into the lungs every 6 (six) hours as needed for wheezing or shortness of breath.   PRN  . albuterol (PROVENTIL) (2.5 MG/3ML) 0.083% nebulizer solution Take 3 mLs (2.5 mg total) by nebulization every 4 (four) hours as needed for wheezing or shortness of breath. Do not use  with your rescue inhaler; just one or the other 50 mL 1 PRN  . ALPRAZolam (XANAX) 0.25 MG tablet Take 0.25 mg by mouth at bedtime.    09/10/2015 at pm  . amLODipine (NORVASC) 10 MG tablet Take 10 mg by mouth daily.   09/10/2015 at am  . amoxicillin-clavulanate (AUGMENTIN) 500-125 MG tablet Take 1 tablet by mouth 2 (two) times daily. For 7 days   09/10/2015 at pm  . aspirin EC 81 MG tablet Take 81 mg by mouth daily.   09/10/2015 at am  . calcium-vitamin D (OSCAL WITH D) 500-200 MG-UNIT per tablet Take 1 tablet by mouth daily.    09/10/2015 at am  . Cholecalciferol (VITAMIN D3) 2000 UNITS TABS Take 2,000 Units by mouth daily.   09/10/2015 at am  . clopidogrel (PLAVIX) 75 MG tablet Take 75 mg by mouth daily.   09/10/2015 at am  . fexofenadine (ALLEGRA) 180 MG tablet Take 180 mg by mouth daily.   09/10/2015 at am  . meclizine (ANTIVERT) 25 MG tablet Take 25 mg by mouth daily as needed for dizziness.   PRN  . Melatonin 3 MG TABS Take 3 mg by mouth at bedtime.   09/10/2015 at pm  . mometasone (NASONEX) 50 MCG/ACT nasal spray Place 2 sprays into the nose at bedtime.    09/10/2015 at pm  . Multiple Vitamins-Minerals (MULTIVITAMIN WITH MINERALS) tablet Take 1 tablet by mouth daily.   09/10/2015 at am  . olmesartan (  BENICAR) 40 MG tablet Take 40 mg by mouth daily.   09/10/2015 at am  . Omega-3 Fatty Acids (OMEGA-3 FISH OIL) 1200 MG CAPS Take 1,200 mg by mouth daily.   09/10/2015 at am  . predniSONE (DELTASONE) 20 MG tablet Take 20 mg by mouth See admin instructions. 40 mg daily for 5 days, then 20 mg daily for 5 days, then stop   09/10/2015 at am  . raloxifene (EVISTA) 60 MG tablet Take 60 mg by mouth daily.   09/10/2015 at am  . simvastatin (ZOCOR) 40 MG tablet Take 40 mg by mouth at bedtime.    09/10/2015 at pm  . sotalol (BETAPACE) 80 MG tablet Take 1 tablet (80 mg total) by mouth every 12 (twelve) hours. 60 tablet 0 09/10/2015 at 2300  . Umeclidinium-Vilanterol (ANORO ELLIPTA) 62.5-25 MCG/INH AEPB Inhale 1 puff into the  lungs daily.   09/10/2015 at am  . vitamin C (ASCORBIC ACID) 500 MG tablet Take 500 mg by mouth daily.   09/10/2015 at am  . chlorpheniramine-HYDROcodone [facility]) 10-8 MG/5ML SUER Take 5 mLs by mouth every 12 (twelve) hours as needed for cough. (Patient not taking: Reported on 09/11/2015) 115 mL 0    Social History   Social History  . Marital Status: Widowed    Spouse Name: N/A  . Number of Children: N/A  . Years of Education: N/A   Occupational History  . Not on file.   Social History Main Topics  . Smoking status: Never Smoker   . Smokeless tobacco: Never Used  . Alcohol Use: No  . Drug Use: No  . Sexual Activity: Not on file   Other Topics Concern  . Not on file   Social History Narrative    Family History  Problem Relation Age of Onset  . Hypertension Father   . Emphysema Father   . Cancer Sister     "female" then stomach  . COPD Sister   . Stroke Brother   . COPD Daughter   . Heart disease Paternal Grandfather   . COPD Maternal Grandfather     esophageal      Review of systems complete and found to be negative unless listed above      PHYSICAL EXAM  General: Well developed, well nourished, in no acute distress HEENT:  Normocephalic and atramatic Neck:  No JVD.  Lungs: Clear bilaterally to auscultation and percussion. Heart: HRRR . Normal S1 and S2 without gallops or murmurs.  Abdomen: Bowel sounds are positive, abdomen soft and non-tender  Msk:  Back normal, normal gait. Normal strength and tone for age. Extremities: No clubbing, cyanosis or edema.   Neuro: Alert and oriented X 3. Psych:  Good affect, responds appropriately  Labs:   Lab Results  Component Value Date   WBC 17.9* 09/13/2015   HGB 10.9* 09/13/2015   HCT 32.6* 09/13/2015   MCV 90.8 09/13/2015   PLT 253 09/13/2015    Recent Labs Lab 09/13/15 0536  NA 133*  K 4.0  CL 102  CO2 27  BUN 26*  CREATININE 0.75  CALCIUM 8.3*  GLUCOSE 128*   Lab Results   Component Value Date   TROPONINI <0.03 09/11/2015    Lab Results  Component Value Date   CHOL 169 09/13/2015   Lab Results  Component Value Date   HDL 60 09/13/2015   Lab Results  Component Value Date   LDLCALC 63 09/13/2015   Lab Results  Component Value Date   TRIG  228* 09/13/2015   Lab Results  Component Value Date   CHOLHDL 2.8 09/13/2015   No results found for: LDLDIRECT    Radiology: Dg Chest 2 View  09/11/2015  CLINICAL DATA:  Pain posterior chest.  Fall this morning.  COPD. EXAM: CHEST  2 VIEW COMPARISON:  Radiograph 05/22/2015 FINDINGS: Normal cardiac silhouette. Aorta is ectatic. There is chronic bronchitic markings again noted. No effusion, infiltrate or pneumothorax. Lungs are hyperinflated. Degenerative osteophytosis of the thoracic spine. IMPRESSION: Hyperinflated lungs without acute findings. Electronically Signed   By: Suzy Bouchard M.D.   On: 09/11/2015 14:56   Dg Elbow Complete Left  09/11/2015  CLINICAL DATA:  Recent fall while using walker with left elbow pain, initial encounter EXAM: LEFT ELBOW - COMPLETE 3+ VIEW COMPARISON:  None. FINDINGS: No acute fracture or dislocation is noted. No significant joint effusion is seen. Soft tissue swelling is noted along the posterior aspect of the proximal forearm and elbow consistent with hematoma from the recent injury IMPRESSION: Soft tissue injury without acute bony abnormality. Electronically Signed   By: Inez Catalina M.D.   On: 09/11/2015 14:19   Mr Brain Wo Contrast  09/12/2015  CLINICAL DATA:  Fall yesterday.  Tingling.  Gait instability. EXAM: MRI HEAD WITHOUT CONTRAST TECHNIQUE: Multiplanar, multiecho pulse sequences of the brain and surrounding structures were obtained without intravenous contrast. COMPARISON:  MRI brain 09/11/2009 FINDINGS: A 2.5 cm acute nonhemorrhagic infarct is present within the right lentiform nucleus. This extends superiorly across the posterior limb of the right internal capsule. Remote  lacunar infarcts are present in the basal ganglia bilaterally. Moderate diffuse white matter disease is present otherwise. No focal cortical infarcts are present. There is no hemorrhage. Flow is present in the major intracranial arteries. The globes and orbits are intact. Multiple foci of remote hemorrhage are present in the inferior cerebellum bilaterally. There are 2 foci of remote hemorrhage in the parietal lobes and 1 in the right thalamus. The paranasal sinuses are clear. There is some fluid in the right mastoid air cells. No obstructing nasopharyngeal lesion is evident. Skullbase is within normal limits. Midline sagittal images are unremarkable. Intracranial midline sagittal images are within normal limits. Degenerative changes are noted in the upper cervical spine IMPRESSION: 1. Acute nonhemorrhagic infarct involving the right lentiform nucleus and posterior limb internal capsule. 2. Multiple other remote lacunar infarcts are present within the basal ganglia bilaterally. 3. Multiple punctate foci of remote hemorrhage in the cerebellum and bilateral parietal lobes is well is the right thalamus. This is nonspecific, but most commonly seen in the setting of a vasculitis, specifically amyloid angiopathy. 4. Moderate diffuse white matter disease likely reflects the sequela of chronic microvascular ischemia. These results will be called to the ordering clinician or representative by the Radiologist Assistant, and communication documented in the PACS or zVision Dashboard. Electronically Signed   By: San Morelle M.D.   On: 09/12/2015 16:59   US Carotid Bilateral  09/12/2015  CLINICAL DATA:  Stroke EXAM: BILATERAL CAROTID DUPLEX ULTRASOUND TECHNIQUE: Yandow scale imaging, color Doppler and duplex ultrasound were performed of bilateral carotid and vertebral arteries in the neck. COMPARISON:  09/11/2009 FINDINGS: Criteria: Quantification of carotid stenosis is based on velocity parameters that correlate the  residual internal carotid diameter with NASCET-based stenosis levels, using the diameter of the distal internal carotid lumen as the denominator for stenosis measurement. The following velocity measurements were obtained: RIGHT ICA:  131 cm/sec CCA:  73 cm/sec SYSTOLIC ICA/CCA RATIO:  1.8 DIASTOLIC ICA/CCA  RATIO:  3.1 ECA:  91 cm/sec LEFT ICA:  103 cm/sec CCA:  73 cm/sec SYSTOLIC ICA/CCA RATIO:  1.4 DIASTOLIC ICA/CCA RATIO:  1.1 ECA:  93 cm/sec RIGHT CAROTID ARTERY: Moderate calcified plaque in the bulb. Low resistance low resistance internal carotid Doppler pattern. RIGHT VERTEBRAL ARTERY:  Antegrade.  Normal Doppler pattern. LEFT CAROTID ARTERY: Mild calcified plaque along the wall of the bulb. Low resistance internal carotid Doppler pattern. LEFT VERTEBRAL ARTERY: Antegrade with a low resistance Doppler pattern IMPRESSION: Less than 50% stenosis in the left internal carotid artery. 50-69% stenosis in the right internal carotid artery. This has progressed since the prior study. Electronically Signed   By: Marybelle Killings M.D.   On: 09/12/2015 15:43   Dg Hip Unilat With Pelvis 2-3 Views Left  09/11/2015  CLINICAL DATA:  Fall with left hip pain.  Initial encounter. EXAM: DG HIP (WITH OR WITHOUT PELVIS) 2-3V LEFT COMPARISON:  None. FINDINGS: Acute fracture of the left obturator ring with segmental fracturing of the superior pubic ramus at the body and puboacetabular junction and comminuted fracturing at the medial inferior ramus. The superior ramus is mildly displaced. No evidence of hip fracture or dislocation. Based on fracture pattern a sacral insufficiency fracture is expected but not visualized. Osteopenia. Left hip osteoarthritis with spurring but no joint narrowing. Calcification from left gluteal tendinitis at the greater trochanter. IMPRESSION: Left obturator ring fractures as described above. Electronically Signed   By: Monte Fantasia M.D.   On: 09/11/2015 12:18    EKG: Sinus rhythm  ASSESSMENT AND  PLAN:   1. Palpitations, no ectopy observed on telemetry 2. History of SVT, currently on sotalol. No documented history of atrial fibrillation. 3. Coronary artery disease, status post coronary stents, no chest pain  Recommendations  1. Agree with current therapy 2. Continue aspirin and Plavix. No current indication for chronic anticoagulation with warfarin for now oral anticoagulants. 3. Continue to monitor on telemetry 4. Review 2-D echocardiogram 5. Further recommendations pending echocardiogram results   Signed: Merl Bommarito MD,PhD, [facility] 09/13/2015, 10:03 AM

## 2015-09-13 NOTE — Progress Notes (Signed)
CSW met with patient and daughter [name] at bedside to provide SNF bed offers. Daughter informed CSW she will talk with family and decide on a facility. CSW will continue to follow to assist with disposition when patient is medically ready to discharge.   Marisa Hicks. LCSWA, MSW Clinical Social Work Department 1:52 PM

## 2015-09-13 NOTE — Consult Note (Signed)
[facility] Vascular Consult Note  MRN : [id number]  Marisa Hicks is a 81 y.o. (04-30-1922) female who presents with chief complaint of  Chief Complaint  Patient presents with  . Fall  . Hip Pain  .  History of Present Illness: Patient admitted with falls and hip pain. Found to have a pelvic fracture. Also found to have some left arm weakness and an MRI was done which showed a very small right sided stroke as well as history of multiple previous strokes. She is improving and seems near her baseline. Her family accompanies her today provide some history. She denies any temporary monocular blindness speech or swallowing difficulty. As part of her workup a carotid duplex was performed. This demonstrated velocities just into the 50-69% range in the right carotid artery showed slight progression from her study years ago. Her left ICA stenosis was in the less than 50% range. We are consulted for evaluation of her carotid artery disease.  Current Facility-Administered Medications  Medication Dose Route Frequency Provider Last Rate Last Dose  . acetaminophen (TYLENOL) tablet 650 mg  650 mg Oral Q6H PRN Demetrios Loll, MD       Or  . acetaminophen (TYLENOL) suppository 650 mg  650 mg Rectal Q6H PRN Demetrios Loll, MD      . acidophilus (RISAQUAD) capsule 1 capsule  1 capsule Oral Daily Demetrios Loll, MD   1 capsule at 09/13/15 1010  . albuterol (PROVENTIL) (2.5 MG/3ML) 0.083% nebulizer solution 2.5 mg  2.5 mg Nebulization Q2H PRN Demetrios Loll, MD      . ALPRAZolam Duanne Moron) tablet 0.25 mg  0.25 mg Oral QHS Demetrios Loll, MD   0.25 mg at 09/12/15 2114  . amLODipine (NORVASC) tablet 10 mg  10 mg Oral Daily Demetrios Loll, MD   10 mg at 09/13/15 1010  . aspirin EC tablet 81 mg  81 mg Oral Daily Sital Mody, MD   81 mg at 09/13/15 1011  . calcium-vitamin D (OSCAL WITH D) 500-200 MG-UNIT per tablet 1 tablet  1 tablet Oral Daily Demetrios Loll, MD   1 tablet at 09/13/15 1010  . cholecalciferol (VITAMIN D) tablet  2,000 Units  2,000 Units Oral Daily Demetrios Loll, MD   2,000 Units at 09/13/15 1010  . clopidogrel (PLAVIX) tablet 75 mg  75 mg Oral Daily Bettey Costa, MD   75 mg at 09/13/15 1010  . fluticasone (FLONASE) 50 MCG/ACT nasal spray 1 spray  1 spray Each Nare Daily Demetrios Loll, MD   1 spray at 09/13/15 1348  . irbesartan (AVAPRO) tablet 300 mg  300 mg Oral Daily Demetrios Loll, MD   300 mg at 09/13/15 1347  . lidocaine (LIDODERM) 5 % 1 patch  1 patch Transdermal Q24H Bettey Costa, MD   1 patch at 09/13/15 0547  . loratadine (CLARITIN) tablet 10 mg  10 mg Oral Daily Demetrios Loll, MD   10 mg at 09/13/15 1011  . meclizine (ANTIVERT) tablet 25 mg  25 mg Oral Daily PRN Demetrios Loll, MD      . morphine 2 MG/ML injection 2 mg  2 mg Intravenous Q4H PRN Demetrios Loll, MD   2 mg at 09/11/15 1800  . ondansetron (ZOFRAN) tablet 4 mg  4 mg Oral Q6H PRN Demetrios Loll, MD       Or  . ondansetron [facility]) injection 4 mg  4 mg Intravenous Q6H PRN Demetrios Loll, MD   4 mg at 09/12/15 1223  . oxyCODONE-acetaminophen (PERCOCET/ROXICET) 5-325  MG per tablet 1 tablet  1 tablet Oral Q4H PRN Demetrios Loll, MD   1 tablet at 09/13/15 1010  . predniSONE (DELTASONE) tablet 20 mg  20 mg Oral Q breakfast Demetrios Loll, MD   20 mg at 09/13/15 1010  . raloxifene (EVISTA) tablet 60 mg  60 mg Oral Daily Demetrios Loll, MD   60 mg at 09/13/15 1000  . simvastatin (ZOCOR) tablet 40 mg  40 mg Oral QHS Demetrios Loll, MD   40 mg at 09/12/15 2114  . sotalol (BETAPACE) tablet 80 mg  80 mg Oral Q12H Demetrios Loll, MD   80 mg at 09/13/15 1010  . Umeclidinium-Vilanterol 62.5-25 MCG/INH AEPB   Inhalation Daily Lance Coon, MD        Past Medical History  Diagnosis Date  . Renal disorder   . Arthritis   . Osteoarthritis   . CHF (congestive heart failure) ([place])   . Dyspnea   . CAD (coronary artery disease)   . COPD (chronic obstructive pulmonary disease) ([place])   . Hypertension   . Hyperlipidemia   . Anxiety   . Allergy   . Osteoporosis   . Proteinuria   . Lymphocytosis   . Dysphagia    . Monocytosis   . Hyponatremia   . Pulmonary hypertension ([place])   . Left ventricular hypertrophy   . Hypertriglyceridemia     Past Surgical History  Procedure Laterality Date  . Tubal ligation    . Tonsillectomy    . Right knee surgery    . Appendectomy      Social History Social History  Substance Use Topics  . Smoking status: Never Smoker   . Smokeless tobacco: Never Used  . Alcohol Use: No  No IVDU  Family History Family History  Problem Relation Age of Onset  . Hypertension Father   . Emphysema Father   . Cancer Sister     "female" then stomach  . COPD Sister   . Stroke Brother   . COPD Daughter   . Heart disease Paternal Grandfather   . COPD Maternal Grandfather     esophageal    Allergies  Allergen Reactions  . Ace Inhibitors   . Aliskiren-Hydrochlorothiazide Other (See Comments)  . Altace [Ramipril] Other (See Comments)    angioedema  . Atorvastatin Other (See Comments)  . Hydralazine Hcl   . Hydrochlorothiazide Other (See Comments)    hyponatremia  . Irbesartan-Hydrochlorothiazide Other (See Comments)  . Labetalol Hcl   . Rosuvastatin Other (See Comments)  . Tribenzor [Olmesartan-Amlodipine-Hctz] Other (See Comments)    Low sodium     REVIEW OF SYSTEMS (Negative unless checked)  Constitutional: [] Weight loss  [] Fever  [] Chills Cardiac: [] Chest pain   [] Chest pressure   [x] Palpitations   [] Shortness of breath when laying flat   [] Shortness of breath at rest   [] Shortness of breath with exertion. Vascular:  [] Pain in legs with walking   [] Pain in legs at rest   [] Pain in legs when laying flat   [] Claudication   [] Pain in feet when walking  [] Pain in feet at rest  [] Pain in feet when laying flat   [] History of DVT   [] Phlebitis   [] Swelling in legs   [] Varicose veins   [] Non-healing ulcers Pulmonary:   [] Uses home oxygen   [] Productive cough   [] Hemoptysis   [] Wheeze  [] COPD   [] Asthma Neurologic:  [x] Dizziness  [] Blackouts   [] Seizures    [x] History of stroke   [] History of TIA  [] Aphasia   []   Temporary blindness   [] Dysphagia   [] Weakness or numbness in arms   [] Weakness or numbness in legs Musculoskeletal:  [] Arthritis   [] Joint swelling   [] Joint pain   [] Low back pain Hematologic:  [] Easy bruising  [] Easy bleeding   [] Hypercoagulable state   [] Anemic  [] Hepatitis Gastrointestinal:  [] Blood in stool   [] Vomiting blood  [] Gastroesophageal reflux/heartburn   [] Difficulty swallowing. Genitourinary:  [] Chronic kidney disease   [] Difficult urination  [] Frequent urination  [] Burning with urination   [] Blood in urine Skin:  [] Rashes   [] Ulcers   [] Wounds Psychological:  [] History of anxiety   []  History of major depression.  Physical Examination  Filed Vitals:   09/13/15 0001 09/13/15 0214 09/13/15 0409 09/13/15 1149  BP: 123/43 150/61 161/57 162/59  Pulse: 63 66 68 71  Temp: 98.4 F (36.9 C) 98.8 F (37.1 C) 98.9 F (37.2 C) 97.5 F (36.4 C)  TempSrc: Oral Oral Oral Oral  Resp: 18 18 18 19   Height:      Weight:      SpO2: 98% 98% 92% 98%   Body mass index is 24.63 kg/(m^2). Gen:  WD/WN, NAD Head: [place]/AT, No temporalis wasting. Prominent temp pulse not noted. Ear/Nose/Throat: Hearing grossly intact, nares w/o erythema or drainage, oropharynx w/o Erythema/Exudate Eyes: PERRLA, EOMI.  Neck: Supple, no nuchal rigidity.  No JVD.  Pulmonary:  Good air movement, no use of accessory muscles Cardiac: irregular Vascular:  Vessel Right Left  Radial Palpable Palpable                                   Gastrointestinal: soft, non-tender/non-distended. No guarding/reflex. No masses, surgical incisions, or scars. Musculoskeletal: M/S 5/5 throughout. Mild discoordination of left arm/hand.  Extremities without ischemic changes.  No deformity or atrophy. No edema. Neurologic: CN 2-12 intact. Pain and light touch intact in extremities.  Symmetrical.  Speech is fluent. Motor exam as listed above. Psychiatric: Judgment intact,  Mood & affect appropriate for pt's clinical situation. Dermatologic: No rashes or ulcers noted.  No cellulitis or open wounds. Lymph : No Cervical, Axillary, or Inguinal lymphadenopathy.    CBC Lab Results  Component Value Date   WBC 17.9* 09/13/2015   HGB 10.9* 09/13/2015   HCT 32.6* 09/13/2015   MCV 90.8 09/13/2015   PLT 253 09/13/2015    BMET    Component Value Date/Time   NA 133* 09/13/2015 0536   K 4.0 09/13/2015 0536   CL 102 09/13/2015 0536   CO2 27 09/13/2015 0536   GLUCOSE 128* 09/13/2015 0536   BUN 26* 09/13/2015 0536   CREATININE 0.75 09/13/2015 0536   CALCIUM 8.3* 09/13/2015 0536   GFRNONAA >60 09/13/2015 0536   GFRAA >60 09/13/2015 0536   Estimated Creatinine Clearance: 34.7 mL/min (by C-G formula based on Cr of 0.75).  COAG Lab Results  Component Value Date   INR 1.01 09/11/2015    Radiology Dg Chest 2 View  09/11/2015  CLINICAL DATA:  Pain posterior chest.  Fall this morning.  COPD. EXAM: CHEST  2 VIEW COMPARISON:  Radiograph 05/22/2015 FINDINGS: Normal cardiac silhouette. Aorta is ectatic. There is chronic bronchitic markings again noted. No effusion, infiltrate or pneumothorax. Lungs are hyperinflated. Degenerative osteophytosis of the thoracic spine. IMPRESSION: Hyperinflated lungs without acute findings. Electronically Signed   By: Suzy Bouchard M.D.   On: 09/11/2015 14:56   Dg Elbow Complete Left  09/11/2015  CLINICAL DATA:  Recent fall  while using walker with left elbow pain, initial encounter EXAM: LEFT ELBOW - COMPLETE 3+ VIEW COMPARISON:  None. FINDINGS: No acute fracture or dislocation is noted. No significant joint effusion is seen. Soft tissue swelling is noted along the posterior aspect of the proximal forearm and elbow consistent with hematoma from the recent injury IMPRESSION: Soft tissue injury without acute bony abnormality. Electronically Signed   By: Inez Catalina M.D.   On: 09/11/2015 14:19   Mr Brain Wo Contrast  09/12/2015  CLINICAL  DATA:  Fall yesterday.  Tingling.  Gait instability. EXAM: MRI HEAD WITHOUT CONTRAST TECHNIQUE: Multiplanar, multiecho pulse sequences of the brain and surrounding structures were obtained without intravenous contrast. COMPARISON:  MRI brain 09/11/2009 FINDINGS: A 2.5 cm acute nonhemorrhagic infarct is present within the right lentiform nucleus. This extends superiorly across the posterior limb of the right internal capsule. Remote lacunar infarcts are present in the basal ganglia bilaterally. Moderate diffuse white matter disease is present otherwise. No focal cortical infarcts are present. There is no hemorrhage. Flow is present in the major intracranial arteries. The globes and orbits are intact. Multiple foci of remote hemorrhage are present in the inferior cerebellum bilaterally. There are 2 foci of remote hemorrhage in the parietal lobes and 1 in the right thalamus. The paranasal sinuses are clear. There is some fluid in the right mastoid air cells. No obstructing nasopharyngeal lesion is evident. Skullbase is within normal limits. Midline sagittal images are unremarkable. Intracranial midline sagittal images are within normal limits. Degenerative changes are noted in the upper cervical spine IMPRESSION: 1. Acute nonhemorrhagic infarct involving the right lentiform nucleus and posterior limb internal capsule. 2. Multiple other remote lacunar infarcts are present within the basal ganglia bilaterally. 3. Multiple punctate foci of remote hemorrhage in the cerebellum and bilateral parietal lobes is well is the right thalamus. This is nonspecific, but most commonly seen in the setting of a vasculitis, specifically amyloid angiopathy. 4. Moderate diffuse white matter disease likely reflects the sequela of chronic microvascular ischemia. These results will be called to the ordering clinician or representative by the Radiologist Assistant, and communication documented in the PACS or zVision Dashboard. Electronically  Signed   By: San Morelle M.D.   On: 09/12/2015 16:59   US Carotid Bilateral  09/12/2015  CLINICAL DATA:  Stroke EXAM: BILATERAL CAROTID DUPLEX ULTRASOUND TECHNIQUE: Munley scale imaging, color Doppler and duplex ultrasound were performed of bilateral carotid and vertebral arteries in the neck. COMPARISON:  09/11/2009 FINDINGS: Criteria: Quantification of carotid stenosis is based on velocity parameters that correlate the residual internal carotid diameter with NASCET-based stenosis levels, using the diameter of the distal internal carotid lumen as the denominator for stenosis measurement. The following velocity measurements were obtained: RIGHT ICA:  131 cm/sec CCA:  73 cm/sec SYSTOLIC ICA/CCA RATIO:  1.8 DIASTOLIC ICA/CCA RATIO:  3.1 ECA:  91 cm/sec LEFT ICA:  103 cm/sec CCA:  73 cm/sec SYSTOLIC ICA/CCA RATIO:  1.4 DIASTOLIC ICA/CCA RATIO:  1.1 ECA:  93 cm/sec RIGHT CAROTID ARTERY: Moderate calcified plaque in the bulb. Low resistance low resistance internal carotid Doppler pattern. RIGHT VERTEBRAL ARTERY:  Antegrade.  Normal Doppler pattern. LEFT CAROTID ARTERY: Mild calcified plaque along the wall of the bulb. Low resistance internal carotid Doppler pattern. LEFT VERTEBRAL ARTERY: Antegrade with a low resistance Doppler pattern IMPRESSION: Less than 50% stenosis in the left internal carotid artery. 50-69% stenosis in the right internal carotid artery. This has progressed since the prior study. Electronically Signed   By: Arnell Sieving  Hoss M.D.   On: 09/12/2015 15:43   Dg Hip Unilat With Pelvis 2-3 Views Left  09/11/2015  CLINICAL DATA:  Fall with left hip pain.  Initial encounter. EXAM: DG HIP (WITH OR WITHOUT PELVIS) 2-3V LEFT COMPARISON:  None. FINDINGS: Acute fracture of the left obturator ring with segmental fracturing of the superior pubic ramus at the body and puboacetabular junction and comminuted fracturing at the medial inferior ramus. The superior ramus is mildly displaced. No evidence of hip  fracture or dislocation. Based on fracture pattern a sacral insufficiency fracture is expected but not visualized. Osteopenia. Left hip osteoarthritis with spurring but no joint narrowing. Calcification from left gluteal tendinitis at the greater trochanter. IMPRESSION: Left obturator ring fractures as described above. Electronically Signed   By: Monte Fantasia M.D.   On: 09/11/2015 12:18      Assessment/Plan 1. Carotid stenosis. Just into the 50-69% range on the right by velocity criteria and less than 50% range on the left. Unlikely to be the cause of strokes at this level. Given her age and other comorbidities, clearly not at a level where surgery would be considered. Would recommend outpatient follow-up and consider rechecking on 6 month intervals. 2. Stroke. Recent, small stroke with mild residual deficits. Neurology has seen and felt this is likely uncontrolled hypertension. Also has evidence of old strokes. Agree with medical management as above. Continue aspirin and Plavix. 3. Hypertension. Better control important in reducing risks of stroke. 4. Heart disease. Seen by cardiology. No intervention currently recommended. Consider outpatient follow-up with primary cardiologist   Queenie Aufiero, MD  09/13/2015 1:52 PM

## 2015-09-13 NOTE — Progress Notes (Signed)
Physical Therapy Treatment Patient Details Name: Marisa Hicks MRN: [id number] DOB: 18-May-1922 Today's Date: 09/13/2015    History of Present Illness Pt had a fall while using walker suffering L pelvic fracture and L side rib pain.    PT Comments    Pt is able to show minimally more assistance with exercises and bed mobility, but is still quite limited.  Despite heavy cuing to try and shift weight to the R and forward but she was consistently leaning back and Left.  Pt is able to do some sitting with L lean but w/o loosing balance, however during standing she has a very hard time moving feet effectively and leans heavily to the L.  Follow Up Recommendations  SNF     Equipment Recommendations       Recommendations for Other Services       Precautions / Restrictions Precautions Precautions: Fall Restrictions Weight Bearing Restrictions: Yes LLE Weight Bearing: Weight bearing as tolerated    Mobility  Bed Mobility Overal bed mobility: Needs Assistance Bed Mobility: Supine to Sit     Supine to sit: Max assist     General bed mobility comments: Pt shows slightly more ability to assist with supine to sit but still is leaning heavily to the L and generally requires a lot of assist  Transfers Overall transfer level: Needs assistance Equipment used: Rolling walker (2 wheeled) Transfers: Sit to/from Stand Sit to Stand: Max assist;Mod assist         General transfer comment: Pt again shows slighly more ability to assist with transfers but L lean is very difficult  Ambulation/Gait Ambulation/Gait assistance: Max assist Ambulation Distance (Feet): 3 Feet Assistive device: Rolling walker (2 wheeled)       General Gait Details: Again pt very limited with ability to ambulate and despite much cuing and assist continues to lean heavily to the L.     Stairs            Wheelchair Mobility    Modified Rankin (Stroke Patients Only)       Balance                                     Cognition Arousal/Alertness: Awake/alert   Overall Cognitive Status: Within Functional Limits for tasks assessed                      Exercises General Exercises - Lower Extremity Ankle Circles/Pumps: AROM;Both;10 reps Quad Sets: Strengthening;10 reps Gluteal Sets: Strengthening;10 reps Heel Slides: AAROM;10 reps Hip ABduction/ADduction: AAROM;10 reps    General Comments        Pertinent Vitals/Pain Pain Assessment: 0-10 Pain Score: 6  (pt with no pain at rest, generally tolerates mvt well)    Home Living                      Prior Function            PT Goals (current goals can now be found in the care plan section) Progress towards PT goals: Progressing toward goals    Frequency  7X/week    PT Plan Current plan remains appropriate    Co-evaluation             End of Session Equipment Utilized During Treatment: Gait belt Activity Tolerance: Patient limited by pain Patient left: with chair alarm set;with call bell/phone within reach;with family/visitor  present     Time: 1152-1217 PT Time Calculation (min) (ACUTE ONLY): 25 min  Charges:  $Therapeutic Exercise: 8-22 mins $Therapeutic Activity: 8-22 mins                    G Codes:     Wayne Both, PT, DPT [phone]  Kreg Shropshire 09/13/2015, 2:10 PM

## 2015-09-13 NOTE — Progress Notes (Signed)
Assumed care of patient from Glenda Fields, RN at 1515. Wardell Pokorski S, RN  

## 2015-09-13 NOTE — Consult Note (Signed)
Reason for Consult: stroke Referring Physician: Dr. Janyce Llanos Marisa Hicks is an 81 y.o. female.  HPI:  81yo RHD F presents to [facility] due to fall.  While in hospital she was noted to have some mild L sided weakness so Neurology was consulted for possible stroke.  Pt denies any weakness, numbness, tingling, speech or vision changes.  Pt states that she feels good except for pain from hip and L arm bruising.  Past Medical History  Diagnosis Date  . Renal disorder   . Arthritis   . Osteoarthritis   . CHF (congestive heart failure) ([place])   . Dyspnea   . CAD (coronary artery disease)   . COPD (chronic obstructive pulmonary disease) ([place])   . Hypertension   . Hyperlipidemia   . Anxiety   . Allergy   . Osteoporosis   . Proteinuria   . Lymphocytosis   . Dysphagia   . Monocytosis   . Hyponatremia   . Pulmonary hypertension ([place])   . Left ventricular hypertrophy   . Hypertriglyceridemia     Past Surgical History  Procedure Laterality Date  . Tubal ligation    . Tonsillectomy    . Right knee surgery    . Appendectomy      Family History  Problem Relation Age of Onset  . Hypertension Father   . Emphysema Father   . Cancer Sister     "female" then stomach  . COPD Sister   . Stroke Brother   . COPD Daughter   . Heart disease Paternal Grandfather   . COPD Maternal Grandfather     esophageal    Social History:  reports that she has never smoked. She has never used smokeless tobacco. She reports that she does not drink alcohol or use illicit drugs.  Allergies:  Allergies  Allergen Reactions  . Ace Inhibitors   . Aliskiren-Hydrochlorothiazide Other (See Comments)  . Altace [Ramipril] Other (See Comments)    angioedema  . Atorvastatin Other (See Comments)  . Hydralazine Hcl   . Hydrochlorothiazide Other (See Comments)    hyponatremia  . Irbesartan-Hydrochlorothiazide Other (See Comments)  . Labetalol Hcl   . Rosuvastatin Other (See Comments)  . Tribenzor  [Olmesartan-Amlodipine-Hctz] Other (See Comments)    Low sodium    Medications: personally reviewed by me as per chart  Results for orders placed or performed during the hospital encounter of 09/11/15 (from the past 48 hour(s))  CBC     Status: Abnormal   Collection Time: 09/11/15  1:46 PM  Result Value Ref Range   WBC 34.1 (H) 3.6 - 11.0 K/uL   RBC 3.95 3.80 - 5.20 MIL/uL   Hemoglobin 11.7 (L) 12.0 - 16.0 g/dL   HCT 35.3 35.0 - 47.0 %   MCV 89.5 80.0 - 100.0 fL   MCH 29.7 26.0 - 34.0 pg   MCHC 33.2 32.0 - 36.0 g/dL   RDW 13.5 11.5 - 14.5 %   Platelets 310 150 - 440 K/uL  Basic metabolic panel     Status: Abnormal   Collection Time: 09/11/15  1:46 PM  Result Value Ref Range   Sodium 139 135 - 145 mmol/L   Potassium 3.2 (L) 3.5 - 5.1 mmol/L   Chloride 108 101 - 111 mmol/L   CO2 25 22 - 32 mmol/L   Glucose, Bld 118 (H) 65 - 99 mg/dL   BUN 34 (H) 6 - 20 mg/dL   Creatinine, Ser 0.84 0.44 - 1.00 mg/dL   Calcium 8.5 (  L) 8.9 - 10.3 mg/dL   GFR calc non Af Amer 59 (L) >60 mL/min   GFR calc Af Amer >60 >60 mL/min    Comment: (NOTE) The eGFR has been calculated using the CKD EPI equation. This calculation has not been validated in all clinical situations. eGFR's persistently <60 mL/min signify possible Chronic Kidney Disease.    Anion gap 6 5 - 15  APTT     Status: None   Collection Time: 09/11/15  1:46 PM  Result Value Ref Range   aPTT 28 24 - 36 seconds  Protime-INR     Status: None   Collection Time: 09/11/15  1:46 PM  Result Value Ref Range   Prothrombin Time 13.5 11.4 - 15.0 seconds   INR 1.01   Troponin I     Status: None   Collection Time: 09/11/15  1:46 PM  Result Value Ref Range   Troponin I <0.03 <0.031 ng/mL    Comment:        NO INDICATION OF MYOCARDIAL INJURY.   Type and screen [place]     Status: None   Collection Time: 09/11/15  1:47 PM  Result Value Ref Range   ABO/RH(D) O POS    Antibody Screen NEG    Sample Expiration  09/14/2015   ABO/Rh     Status: None   Collection Time: 09/11/15  1:48 PM  Result Value Ref Range   ABO/RH(D) O POS   Urinalysis complete, with microscopic ([facility] only)     Status: Abnormal   Collection Time: 09/11/15  3:16 PM  Result Value Ref Range   Color, Urine STRAW (A) YELLOW   APPearance CLEAR (A) CLEAR   Glucose, UA 50 (A) NEGATIVE mg/dL   Bilirubin Urine NEGATIVE NEGATIVE   Ketones, ur NEGATIVE NEGATIVE mg/dL   Specific Gravity, Urine 1.011 1.005 - 1.030   Hgb urine dipstick NEGATIVE NEGATIVE   pH 7.0 5.0 - 8.0   Protein, ur >500 (A) NEGATIVE mg/dL   Nitrite NEGATIVE NEGATIVE   Leukocytes, UA NEGATIVE NEGATIVE   RBC / HPF NONE SEEN 0 - 5 RBC/hpf   WBC, UA 0-5 0 - 5 WBC/hpf   Bacteria, UA NONE SEEN NONE SEEN   Squamous Epithelial / LPF 0-5 (A) NONE SEEN  Magnesium     Status: None   Collection Time: 09/11/15  7:03 PM  Result Value Ref Range   Magnesium 1.7 1.7 - 2.4 mg/dL  Basic metabolic panel     Status: Abnormal   Collection Time: 09/12/15  5:24 AM  Result Value Ref Range   Sodium 137 135 - 145 mmol/L   Potassium 5.0 3.5 - 5.1 mmol/L    Comment: HEMOLYSIS AT THIS LEVEL MAY AFFECT RESULT   Chloride 107 101 - 111 mmol/L   CO2 23 22 - 32 mmol/L   Glucose, Bld 173 (H) 65 - 99 mg/dL   BUN 26 (H) 6 - 20 mg/dL   Creatinine, Ser 0.72 0.44 - 1.00 mg/dL   Calcium 8.4 (L) 8.9 - 10.3 mg/dL   GFR calc non Af Amer >60 >60 mL/min   GFR calc Af Amer >60 >60 mL/min    Comment: (NOTE) The eGFR has been calculated using the CKD EPI equation. This calculation has not been validated in all clinical situations. eGFR's persistently <60 mL/min signify possible Chronic Kidney Disease.    Anion gap 7 5 - 15  CBC     Status: Abnormal   Collection Time: 09/12/15  6:47  AM  Result Value Ref Range   WBC 17.2 (H) 3.6 - 11.0 K/uL   RBC 3.68 (L) 3.80 - 5.20 MIL/uL   Hemoglobin 10.9 (L) 12.0 - 16.0 g/dL   HCT 33.1 (L) 35.0 - 47.0 %   MCV 90.1 80.0 - 100.0 fL   MCH 29.7 26.0 - 34.0 pg    MCHC 32.9 32.0 - 36.0 g/dL   RDW 14.0 11.5 - 14.5 %   Platelets 276 150 - 440 K/uL  CBC     Status: Abnormal   Collection Time: 09/13/15  5:36 AM  Result Value Ref Range   WBC 17.9 (H) 3.6 - 11.0 K/uL   RBC 3.59 (L) 3.80 - 5.20 MIL/uL   Hemoglobin 10.9 (L) 12.0 - 16.0 g/dL   HCT 32.6 (L) 35.0 - 47.0 %   MCV 90.8 80.0 - 100.0 fL   MCH 30.3 26.0 - 34.0 pg   MCHC 33.3 32.0 - 36.0 g/dL   RDW 13.7 11.5 - 14.5 %   Platelets 253 150 - 440 K/uL  Basic metabolic panel     Status: Abnormal   Collection Time: 09/13/15  5:36 AM  Result Value Ref Range   Sodium 133 (L) 135 - 145 mmol/L   Potassium 4.0 3.5 - 5.1 mmol/L   Chloride 102 101 - 111 mmol/L   CO2 27 22 - 32 mmol/L   Glucose, Bld 128 (H) 65 - 99 mg/dL   BUN 26 (H) 6 - 20 mg/dL   Creatinine, Ser 0.75 0.44 - 1.00 mg/dL   Calcium 8.3 (L) 8.9 - 10.3 mg/dL   GFR calc non Af Amer >60 >60 mL/min   GFR calc Af Amer >60 >60 mL/min    Comment: (NOTE) The eGFR has been calculated using the CKD EPI equation. This calculation has not been validated in all clinical situations. eGFR's persistently <60 mL/min signify possible Chronic Kidney Disease.    Anion gap 4 (L) 5 - 15  Lipid panel     Status: Abnormal   Collection Time: 09/13/15  5:36 AM  Result Value Ref Range   Cholesterol 169 0 - 200 mg/dL   Triglycerides 228 (H) <150 mg/dL   HDL 60 >40 mg/dL   Total CHOL/HDL Ratio 2.8 RATIO   VLDL 46 (H) 0 - 40 mg/dL   LDL Cholesterol 63 0 - 99 mg/dL    Comment:        Total Cholesterol/HDL:CHD Risk Coronary Heart Disease Risk Table                     Men   Women  1/2 Average Risk   3.4   3.3  Average Risk       5.0   4.4  2 X Average Risk   9.6   7.1  3 X Average Risk  23.4   11.0        Use the calculated Patient Ratio above and the CHD Risk Table to determine the patient's CHD Risk.        ATP III CLASSIFICATION (LDL):  <100     mg/dL   Optimal  100-129  mg/dL   Near or Above                    Optimal  130-159  mg/dL    Borderline  160-189  mg/dL   High  >190     mg/dL   Very High     Dg Chest 2 View  09/11/2015  CLINICAL DATA:  Pain posterior chest.  Fall this morning.  COPD. EXAM: CHEST  2 VIEW COMPARISON:  Radiograph 05/22/2015 FINDINGS: Normal cardiac silhouette. Aorta is ectatic. There is chronic bronchitic markings again noted. No effusion, infiltrate or pneumothorax. Lungs are hyperinflated. Degenerative osteophytosis of the thoracic spine. IMPRESSION: Hyperinflated lungs without acute findings. Electronically Signed   By: Suzy Bouchard M.D.   On: 09/11/2015 14:56   Dg Elbow Complete Left  09/11/2015  CLINICAL DATA:  Recent fall while using walker with left elbow pain, initial encounter EXAM: LEFT ELBOW - COMPLETE 3+ VIEW COMPARISON:  None. FINDINGS: No acute fracture or dislocation is noted. No significant joint effusion is seen. Soft tissue swelling is noted along the posterior aspect of the proximal forearm and elbow consistent with hematoma from the recent injury IMPRESSION: Soft tissue injury without acute bony abnormality. Electronically Signed   By: Inez Catalina M.D.   On: 09/11/2015 14:19   Mr Brain Wo Contrast  09/12/2015  CLINICAL DATA:  Fall yesterday.  Tingling.  Gait instability. EXAM: MRI HEAD WITHOUT CONTRAST TECHNIQUE: Multiplanar, multiecho pulse sequences of the brain and surrounding structures were obtained without intravenous contrast. COMPARISON:  MRI brain 09/11/2009 FINDINGS: A 2.5 cm acute nonhemorrhagic infarct is present within the right lentiform nucleus. This extends superiorly across the posterior limb of the right internal capsule. Remote lacunar infarcts are present in the basal ganglia bilaterally. Moderate diffuse white matter disease is present otherwise. No focal cortical infarcts are present. There is no hemorrhage. Flow is present in the major intracranial arteries. The globes and orbits are intact. Multiple foci of remote hemorrhage are present in the inferior cerebellum  bilaterally. There are 2 foci of remote hemorrhage in the parietal lobes and 1 in the right thalamus. The paranasal sinuses are clear. There is some fluid in the right mastoid air cells. No obstructing nasopharyngeal lesion is evident. Skullbase is within normal limits. Midline sagittal images are unremarkable. Intracranial midline sagittal images are within normal limits. Degenerative changes are noted in the upper cervical spine IMPRESSION: 1. Acute nonhemorrhagic infarct involving the right lentiform nucleus and posterior limb internal capsule. 2. Multiple other remote lacunar infarcts are present within the basal ganglia bilaterally. 3. Multiple punctate foci of remote hemorrhage in the cerebellum and bilateral parietal lobes is well is the right thalamus. This is nonspecific, but most commonly seen in the setting of a vasculitis, specifically amyloid angiopathy. 4. Moderate diffuse white matter disease likely reflects the sequela of chronic microvascular ischemia. These results will be called to the ordering clinician or representative by the Radiologist Assistant, and communication documented in the PACS or zVision Dashboard. Electronically Signed   By: San Morelle M.D.   On: 09/12/2015 16:59   US Carotid Bilateral  09/12/2015  CLINICAL DATA:  Stroke EXAM: BILATERAL CAROTID DUPLEX ULTRASOUND TECHNIQUE: Ehmann scale imaging, color Doppler and duplex ultrasound were performed of bilateral carotid and vertebral arteries in the neck. COMPARISON:  09/11/2009 FINDINGS: Criteria: Quantification of carotid stenosis is based on velocity parameters that correlate the residual internal carotid diameter with NASCET-based stenosis levels, using the diameter of the distal internal carotid lumen as the denominator for stenosis measurement. The following velocity measurements were obtained: RIGHT ICA:  131 cm/sec CCA:  73 cm/sec SYSTOLIC ICA/CCA RATIO:  1.8 DIASTOLIC ICA/CCA RATIO:  3.1 ECA:  91 cm/sec LEFT ICA:   103 cm/sec CCA:  73 cm/sec SYSTOLIC ICA/CCA RATIO:  1.4 DIASTOLIC ICA/CCA RATIO:  1.1 ECA:  93 cm/sec RIGHT  CAROTID ARTERY: Moderate calcified plaque in the bulb. Low resistance low resistance internal carotid Doppler pattern. RIGHT VERTEBRAL ARTERY:  Antegrade.  Normal Doppler pattern. LEFT CAROTID ARTERY: Mild calcified plaque along the wall of the bulb. Low resistance internal carotid Doppler pattern. LEFT VERTEBRAL ARTERY: Antegrade with a low resistance Doppler pattern IMPRESSION: Less than 50% stenosis in the left internal carotid artery. 50-69% stenosis in the right internal carotid artery. This has progressed since the prior study. Electronically Signed   By: Marybelle Killings M.D.   On: 09/12/2015 15:43    Review of Systems  Constitutional: Negative.   HENT: Negative.   Eyes: Negative.   Respiratory: Negative.   Cardiovascular: Negative.   Gastrointestinal: Negative.   Genitourinary: Negative.   Musculoskeletal: Positive for myalgias, joint pain and falls. Negative for back pain and neck pain.  Skin: Negative.   Neurological: Negative for dizziness, tingling, tremors, sensory change, speech change, focal weakness, seizures and loss of consciousness.  Endo/Heme/Allergies: Negative for environmental allergies and polydipsia. Bruises/bleeds easily.  Psychiatric/Behavioral: Negative.    Blood pressure 162/59, pulse 71, temperature 97.5 F (36.4 C), temperature source Oral, resp. rate 19, height _0  (1.499 m), weight 55.339 kg (122 lb), SpO2 98 %. Physical Exam  Nursing note and vitals reviewed. Constitutional: She appears well-developed and well-nourished. No distress.  HENT:  Head: Normocephalic and atraumatic.  Right Ear: External ear normal.  Left Ear: External ear normal.  Nose: Nose normal.  Mouth/Throat: Oropharynx is clear and moist.  Eyes: Conjunctivae and EOM are normal. Pupils are equal, round, and reactive to light. No scleral icterus.  Neck: Normal range of motion. Neck  supple.  Cardiovascular: Normal rate, regular rhythm, normal heart sounds and intact distal pulses.   No murmur heard. Respiratory: Effort normal and breath sounds normal. No respiratory distress.  GI: Soft. Bowel sounds are normal. She exhibits no distension.  Musculoskeletal: Normal range of motion. She exhibits edema and tenderness.  Neurological:  A+Ox4, nl speech and language PERRLA, EOMI, nl VF, mild L droop, tongue midline Drift on L otherwise 5/5, nl tone FTN WNL but L leg in pain 1+/4 B, mute plantars B Nl temp and pin B, no neglect  Skin: She is not diaphoretic. There is erythema.  Psychiatric: She has a normal mood and affect.   MRI of brain personally reviewed by me and shows R internal capsule infarct with moderate white matter changes  Assessment/Plan: 1.  Acute R internal capsule lacunar infarct-  Mildly symptomatic and likely due to uncontrolled HTN and DM 2.  Bruising-  Likely due to meds and age -  Would d/c ASA 50 unless desired by cardiology -  Continue plavix 57m daily and zocor 480mdaily -  Check B12 and replace if lower than 300 -  No need for anticoagulation  -  Needs good BP control with goal < 130/80 starting now -  Hem A1c pending but goal should be < 6 -  Continue therapy -  Will sign off, please call with questions -  [facility]   Dvid Pendry 09/13/2015, 12:24 PM

## 2015-09-13 NOTE — Progress Notes (Signed)
Problem: Education: Goal: Knowledge of disease or condition will improve Outcome: Progressing Stroke booklet given - continue to review with patient and family when present  Problem: Self-Care: Goal: Ability to participate in self-care as condition permits will improve Outcome: Progressing Continues to need assistance with movement Up to chair this shift  Problem: Tissue Perfusion: Goal: Complications of Ischemic Stroke will be minimized. Outcome: Progressing No change in neurological signs

## 2015-09-13 NOTE — Progress Notes (Signed)
Place pt on home CPAP

## 2015-09-13 NOTE — Progress Notes (Addendum)
[facility] [place] NAME: Marisa Hicks    MR#:  [id number]  DATE OF BIRTH:  [birth year]  SUBJECTIVE:   Still having significant amount of pain and pelvis area. Patient was able to get to a chair yesterday and sit for a while.  REVIEW OF SYSTEMS:    Review of Systems  Constitutional: Negative for fever, chills and malaise/fatigue.  HENT: Negative for sore throat.   Eyes: Negative for blurred vision.  Respiratory: Negative for cough, hemoptysis, shortness of breath and wheezing.   Cardiovascular: Negative for chest pain, palpitations and leg swelling.  Gastrointestinal: Negative for nausea, vomiting, abdominal pain, diarrhea and blood in stool.  Genitourinary: Negative for dysuria.  Musculoskeletal: Positive for joint pain and falls. Negative for back pain.  Neurological: Negative for dizziness, tremors, sensory change and headaches.  Endo/Heme/Allergies: Does not bruise/bleed easily.    Tolerating Diet: Yes      DRUG ALLERGIES:   Allergies  Allergen Reactions  . Ace Inhibitors   . Aliskiren-Hydrochlorothiazide Other (See Comments)  . Altace [Ramipril] Other (See Comments)    angioedema  . Atorvastatin Other (See Comments)  . Hydralazine Hcl   . Hydrochlorothiazide Other (See Comments)    hyponatremia  . Irbesartan-Hydrochlorothiazide Other (See Comments)  . Labetalol Hcl   . Rosuvastatin Other (See Comments)  . Tribenzor [Olmesartan-Amlodipine-Hctz] Other (See Comments)    Low sodium    VITALS:  Blood pressure 161/57, pulse 68, temperature 98.9 F (37.2 C), temperature source Oral, resp. rate 18, height 4\' 11"  (1.499 m), weight 55.339 kg (122 lb), SpO2 92 %.  PHYSICAL EXAMINATION:   Physical Exam  Constitutional: She is oriented to person, place, and time and well-developed, well-nourished, and in no distress. No distress.  HENT:  Head: Normocephalic.  Eyes: No scleral icterus.  Neck: Normal range of motion.  Neck supple. No JVD present. No tracheal deviation present.  Cardiovascular: Normal rate, regular rhythm and normal heart sounds.  Exam reveals no gallop and no friction rub.   No murmur heard. Pulmonary/Chest: Effort normal and breath sounds normal. No respiratory distress. She has no wheezes. She has no rales. She exhibits no tenderness.  Abdominal: Soft. Bowel sounds are normal. She exhibits no distension and no mass. There is no tenderness. There is no rebound and no guarding.  Musculoskeletal: Normal range of motion. She exhibits no edema.  Neurological: She is alert and oriented to person, place, and time.  Skin: Skin is warm. No rash noted. No erythema.  Psychiatric: Affect and judgment normal.      LABORATORY PANEL:   CBC  Recent Labs Lab 09/13/15 0536  WBC 17.9*  HGB 10.9*  HCT 32.6*  PLT 253   ------------------------------------------------------------------------------------------------------------------  Chemistries   Recent Labs Lab 09/11/15 1903  09/13/15 0536  NA  --   < > 133*  K  --   < > 4.0  CL  --   < > 102  CO2  --   < > 27  GLUCOSE  --   < > 128*  BUN  --   < > 26*  CREATININE  --   < > 0.75  CALCIUM  --   < > 8.3*  MG 1.7  --   --   < > = values in this interval not displayed. ------------------------------------------------------------------------------------------------------------------  Cardiac Enzymes  Recent Labs Lab 09/11/15 1346  TROPONINI <0.03   ------------------------------------------------------------------------------------------------------------------  RADIOLOGY:  Dg Chest 2 View  09/11/2015  CLINICAL  DATA:  Pain posterior chest.  Fall this morning.  COPD. EXAM: CHEST  2 VIEW COMPARISON:  Radiograph 05/22/2015 FINDINGS: Normal cardiac silhouette. Aorta is ectatic. There is chronic bronchitic markings again noted. No effusion, infiltrate or pneumothorax. Lungs are hyperinflated. Degenerative osteophytosis of the thoracic  spine. IMPRESSION: Hyperinflated lungs without acute findings. Electronically Signed   By: Suzy Bouchard M.D.   On: 09/11/2015 14:56   Dg Elbow Complete Left  09/11/2015  CLINICAL DATA:  Recent fall while using walker with left elbow pain, initial encounter EXAM: LEFT ELBOW - COMPLETE 3+ VIEW COMPARISON:  None. FINDINGS: No acute fracture or dislocation is noted. No significant joint effusion is seen. Soft tissue swelling is noted along the posterior aspect of the proximal forearm and elbow consistent with hematoma from the recent injury IMPRESSION: Soft tissue injury without acute bony abnormality. Electronically Signed   By: Inez Catalina M.D.   On: 09/11/2015 14:19   Mr Brain Wo Contrast  09/12/2015  CLINICAL DATA:  Fall yesterday.  Tingling.  Gait instability. EXAM: MRI HEAD WITHOUT CONTRAST TECHNIQUE: Multiplanar, multiecho pulse sequences of the brain and surrounding structures were obtained without intravenous contrast. COMPARISON:  MRI brain 09/11/2009 FINDINGS: A 2.5 cm acute nonhemorrhagic infarct is present within the right lentiform nucleus. This extends superiorly across the posterior limb of the right internal capsule. Remote lacunar infarcts are present in the basal ganglia bilaterally. Moderate diffuse white matter disease is present otherwise. No focal cortical infarcts are present. There is no hemorrhage. Flow is present in the major intracranial arteries. The globes and orbits are intact. Multiple foci of remote hemorrhage are present in the inferior cerebellum bilaterally. There are 2 foci of remote hemorrhage in the parietal lobes and 1 in the right thalamus. The paranasal sinuses are clear. There is some fluid in the right mastoid air cells. No obstructing nasopharyngeal lesion is evident. Skullbase is within normal limits. Midline sagittal images are unremarkable. Intracranial midline sagittal images are within normal limits. Degenerative changes are noted in the upper cervical spine  IMPRESSION: 1. Acute nonhemorrhagic infarct involving the right lentiform nucleus and posterior limb internal capsule. 2. Multiple other remote lacunar infarcts are present within the basal ganglia bilaterally. 3. Multiple punctate foci of remote hemorrhage in the cerebellum and bilateral parietal lobes is well is the right thalamus. This is nonspecific, but most commonly seen in the setting of a vasculitis, specifically amyloid angiopathy. 4. Moderate diffuse white matter disease likely reflects the sequela of chronic microvascular ischemia. These results will be called to the ordering clinician or representative by the Radiologist Assistant, and communication documented in the PACS or zVision Dashboard. Electronically Signed   By: San Morelle M.D.   On: 09/12/2015 16:59   US Carotid Bilateral  09/12/2015  CLINICAL DATA:  Stroke EXAM: BILATERAL CAROTID DUPLEX ULTRASOUND TECHNIQUE: Balsam scale imaging, color Doppler and duplex ultrasound were performed of bilateral carotid and vertebral arteries in the neck. COMPARISON:  09/11/2009 FINDINGS: Criteria: Quantification of carotid stenosis is based on velocity parameters that correlate the residual internal carotid diameter with NASCET-based stenosis levels, using the diameter of the distal internal carotid lumen as the denominator for stenosis measurement. The following velocity measurements were obtained: RIGHT ICA:  131 cm/sec CCA:  73 cm/sec SYSTOLIC ICA/CCA RATIO:  1.8 DIASTOLIC ICA/CCA RATIO:  3.1 ECA:  91 cm/sec LEFT ICA:  103 cm/sec CCA:  73 cm/sec SYSTOLIC ICA/CCA RATIO:  1.4 DIASTOLIC ICA/CCA RATIO:  1.1 ECA:  93 cm/sec RIGHT CAROTID ARTERY: Moderate  calcified plaque in the bulb. Low resistance low resistance internal carotid Doppler pattern. RIGHT VERTEBRAL ARTERY:  Antegrade.  Normal Doppler pattern. LEFT CAROTID ARTERY: Mild calcified plaque along the wall of the bulb. Low resistance internal carotid Doppler pattern. LEFT VERTEBRAL ARTERY:  Antegrade with a low resistance Doppler pattern IMPRESSION: Less than 50% stenosis in the left internal carotid artery. 50-69% stenosis in the right internal carotid artery. This has progressed since the prior study. Electronically Signed   By: Marybelle Killings M.D.   On: 09/12/2015 15:43   Dg Hip Unilat With Pelvis 2-3 Views Left  09/11/2015  CLINICAL DATA:  Fall with left hip pain.  Initial encounter. EXAM: DG HIP (WITH OR WITHOUT PELVIS) 2-3V LEFT COMPARISON:  None. FINDINGS: Acute fracture of the left obturator ring with segmental fracturing of the superior pubic ramus at the body and puboacetabular junction and comminuted fracturing at the medial inferior ramus. The superior ramus is mildly displaced. No evidence of hip fracture or dislocation. Based on fracture pattern a sacral insufficiency fracture is expected but not visualized. Osteopenia. Left hip osteoarthritis with spurring but no joint narrowing. Calcification from left gluteal tendinitis at the greater trochanter. IMPRESSION: Left obturator ring fractures as described above. Electronically Signed   By: Monte Fantasia M.D.   On: 09/11/2015 12:18     ASSESSMENT AND PLAN:   81 year old female status post fall who presented with pelvic pain and found to have multiple pelvic fractures.  1.  Acute nonhemorrhagic infarct involving the right lentiform nucleus and posterior limb internal capsule: Patient is found to have an acute stroke which probably led to her fall and subsequently her pelvic fractures. MRI shows previous strokes and possible amyloid angiopathy.Patient is currently on aspirin and Plavix as well as statin. Neuro checks have been ordered every 4 hours. Carotid Doppler as above. Consultation for neurology and vascular surgery. Patient may require anticoagulation rather than aspirin and Plavix. Echocardiogram is pending. Patient will need skilled nursing facility at discharge.  2. Multiple pelvic fractures after fall: Continue  supportive pain management. Patient will need skilled nursing facility which is planned for Monday.  3. Leukocytosis: . This was likely elevated due to acute phase reactant and/or steroids.. Chest x-ray shows no acute infiltrate. White blood cell count has improved.  4. Palpitation: Cardiology consultation pending. Telemetry reveals no arrhythmia. Echocardiogram pending  5. Sinusitis: Continue prednisone taper.  6. Sleep apnea: Continue CPAP at night.  Management plans discussed with the patient and she is in agreement.  CODE STATUS: DNR  TOTAL TIME TAKING CARE OF THIS PATIENT: 25 minutes.   Greater than 50% time spent in care and coordination talking with physical therapist, family and nurse.  POSSIBLE D/C 2-3 days, DEPENDING ON CLINICAL CONDITION.   Sherena Machorro M.D on 09/13/2015 at 8:58 AM  Between 7am to 6pm - Pager - [phone] After 6pm go to [email or web address] - password [email or web address] [facility]  Office  [phone]  CC: Primary care physician; Enid Derry, MD  Note: This dictation was prepared with Dragon dictation along with smaller phrase technology. Any transcriptional errors that result from this process are unintentional.

## 2015-09-13 NOTE — Progress Notes (Signed)
Pt placed on home nasal CPAP for sleep

## 2015-09-13 NOTE — Evaluation (Signed)
Clinical/Bedside Swallow Evaluation Patient Details  Name: Marisa Hicks MRN: [id number] Date of Birth: 06-Jul-1922  Today's Date: 09/13/2015 Time: SLP Start Time (ACUTE ONLY): 45 SLP Stop Time (ACUTE ONLY): 1150 SLP Time Calculation (min) (ACUTE ONLY): 60 min  Past Medical History:  Past Medical History  Diagnosis Date  . Renal disorder   . Arthritis   . Osteoarthritis   . CHF (congestive heart failure) ([place])   . Dyspnea   . CAD (coronary artery disease)   . COPD (chronic obstructive pulmonary disease) ([place])   . Hypertension   . Hyperlipidemia   . Anxiety   . Allergy   . Osteoporosis   . Proteinuria   . Lymphocytosis   . Dysphagia   . Monocytosis   . Hyponatremia   . Pulmonary hypertension ([place])   . Left ventricular hypertrophy   . Hypertriglyceridemia    Past Surgical History:  Past Surgical History  Procedure Laterality Date  . Tubal ligation    . Tonsillectomy    . Right knee surgery    . Appendectomy     HPI:  Pt 81yo F w/ multiple medical issues presents to [facility] due to fall. While in hospital she was noted to have some mild L sided weakness so Neurology was consulted for possible stroke. Pt denies any weakness, numbness, tingling, speech or vision changes. Pt states that she feels good except for pain from hip and L arm bruising. Pt denied any trouble w/ her language or her swallowing; Son and pt stated she ate lazagna last PM w/out difficulty. Pt did acknowledge she "strangled" w/ water "every once and a while" but denied it had happened recently even. Pt communicated appropriately w/ SLP w/ slight dysarthria of speech; noted Left labial decreased tone. A/O x3.    Assessment / Plan / Recommendation Clinical Impression  Pt appeared to adequately tolerate trials of thin liquids and soft solids/purees w/ no overt s/s of aspiration noted; no significant oral phaes deficits noted - slight decreased labial sensation w/ bolus residue at corner of mouth and air leakage  in left corner of mouth when using the straw to drink. Pt was able to feed herself w/ setup and support as needed sec. to her vision deficits. No change in vocal quality or respiratory status noted during/post trials. Pt appears at reduced risk for aspiration following general aspiration precautions and good positioning upright (pelvis) w/ po intake at this time. Rec. a mech soft diet for easier eating sec. to LUE weakness(min.). Rec. aspiration precautions and meds in puree if nec for easier swallowing while admitted. NSG updated. Lunch ordered w/ Son and pt.     Aspiration Risk   (reduced following generl aspiration precautions)    Diet Recommendation  mech soft/regular diet (meats cut and moistened); thin liquids; aspiration precautions; positioning  Medication Administration: Whole meds with liquid (w/ puree as Delma Post. )    Other  Recommendations Recommended Consults:  (Dietician as nec. ) Oral Care Recommendations: Oral care BID;Staff/trained caregiver to provide oral care (while admitted)   Follow up Recommendations   (TBD for dysarthria of speech(mild))    Frequency and Duration min 2x/week  1 week       Prognosis Prognosis for Safe Diet Advancement: Good      Swallow Study   General Date of Onset: 09/11/15 HPI: Pt 81yo F w/ multiple medical issues presents to [facility] due to fall. While in hospital she was noted to have some mild L sided weakness so Neurology was  consulted for possible stroke. Pt denies any weakness, numbness, tingling, speech or vision changes. Pt states that she feels good except for pain from hip and L arm bruising. Pt denied any trouble w/ her language or her swallowing; Son and pt stated she ate lazagna last PM w/out difficulty. Pt did acknowledge she "strangled" w/ water "every once and a while" but denied it had happened recently even. Pt communicated appropriately w/ SLP w/ slight dysarthria of speech; noted Left labial decreased tone. A/O x3.  Type of Study:  Bedside Swallow Evaluation Previous Swallow Assessment: none Diet Prior to this Study: Regular;Thin liquids Temperature Spikes Noted: No (wbc elevated) Respiratory Status: Nasal cannula (2 liters) History of Recent Intubation: No Behavior/Cognition: Alert;Cooperative;Pleasant mood Oral Cavity Assessment: Within Functional Limits Oral Care Completed by SLP: No Oral Cavity - Dentition: Adequate natural dentition Vision: Impaired for self-feeding (min. per pt report) Self-Feeding Abilities: Able to feed self;Needs assist;Needs set up Patient Positioning: Upright in bed Baseline Vocal Quality:  (gravely) Volitional Cough: Strong Volitional Swallow: Able to elicit    Oral/Motor/Sensory Function Overall Oral Motor/Sensory Function: Mild impairment Facial ROM: Reduced left Facial Symmetry: Abnormal symmetry left Facial Strength: Reduced left Facial Sensation: Reduced left Lingual ROM: Within Functional Limits Lingual Symmetry: Within Functional Limits Lingual Strength: Within Functional Limits Lingual Sensation: Within Functional Limits Velum: Within Functional Limits Mandible: Within Functional Limits   Ice Chips Ice chips: Within functional limits Presentation: Spoon (fed; 3 trials)   Thin Liquid Thin Liquid: Within functional limits Presentation: Cup;Self Fed;Straw (supported; ~4 ozs total) Other Comments: inconsistent air leak d/t inconsistent labial seal around the straw    Nectar Thick Nectar Thick Liquid: Not tested   Honey Thick Honey Thick Liquid: Not tested   Puree Puree: Within functional limits Presentation: Self Fed;Spoon (4 ozs)   Solid Solid: Within functional limits Presentation: Self Fed;Spoon (3 trials)      Marisa Kenner, MS, CCC-SLP  Santiago Stenzel 09/13/2015,12:51 PM

## 2015-09-14 MED ORDER — DOCUSATE SODIUM 100 MG PO CAPS
100.0000 mg | ORAL_CAPSULE | Freq: Two times a day (BID) | ORAL | Status: DC
  Administered 2015-09-14 – 2015-09-15 (×3): 100 mg via ORAL
  Filled 2015-09-14 (×3): qty 1

## 2015-09-14 MED ORDER — POLYETHYLENE GLYCOL 3350 17 G PO PACK
17.0000 g | PACK | Freq: Every day | ORAL | Status: DC
  Administered 2015-09-14 – 2015-09-15 (×2): 17 g via ORAL
  Filled 2015-09-14 (×2): qty 1

## 2015-09-14 MED ORDER — SODIUM CHLORIDE 0.9 % IJ SOLN
3.0000 mL | Freq: Two times a day (BID) | INTRAMUSCULAR | Status: DC
  Administered 2015-09-14 – 2015-09-15 (×3): 3 mL via INTRAVENOUS

## 2015-09-14 NOTE — Progress Notes (Signed)
Physical Therapy Treatment Patient Details Name: Marisa Hicks MRN: [id number] DOB: 1922/02/19 Today's Date: 09/14/2015    History of Present Illness Pt had a fall while using walker suffering L pelvic fracture and L side rib pain. She has a sitter 7 hours a day 3 days a week and her daughter comes Wed-Friday to help with cooking, cleaning, etc.    PT Comments    Pt does quite a bit better today with all acts.  She has less pain and more AROM with L LE exercises, is able to sit the the EOB with much more control and less L lean and during ambulation is actually able to keep herself upright at time with only CGA and takes some steps and advances walker and is able to control L lean much better.  She is still very weak and limited but made great improvement.   Follow Up Recommendations  CIR;SNF (pt showed considerable improvement today)     Equipment Recommendations       Recommendations for Other Services       Precautions / Restrictions Precautions Precautions: Fall Restrictions Weight Bearing Restrictions: Yes LLE Weight Bearing: Weight bearing as tolerated    Mobility  Bed Mobility Overal bed mobility: Needs Assistance Bed Mobility: Supine to Sit     Supine to sit: Mod assist;Max assist     General bed mobility comments: Pt is able to help more getting up to EOB and has much less severe L lean during sitting this session  Transfers Overall transfer level: Needs assistance Equipment used: Rolling walker (2 wheeled) Transfers: Sit to/from Stand Sit to Stand: Mod assist;Min assist         General transfer comment: Pt does much better with transitions that with previous sessions.  She is able to use b/l UEs to keep herself more upright and has only minor L side lean showing ability to keep herself upright with min assist (and at times only CGA)  Ambulation/Gait Ambulation/Gait assistance: Mod assist Ambulation Distance (Feet): 7 Feet Assistive device: Rolling  walker (2 wheeled)       General Gait Details: Pt does much much better today than previous sessions.  She has much less L lean, shows ability to use her UEs on the walker and actually is able to lift and advance L LE and take a few steps and appropriately move the walker on her own.   Stairs            Wheelchair Mobility    Modified Rankin (Stroke Patients Only)       Balance                                    Cognition Arousal/Alertness: Awake/alert Behavior During Therapy: WFL for tasks assessed/performed Overall Cognitive Status: Within Functional Limits for tasks assessed                      Exercises General Exercises - Lower Extremity Ankle Circles/Pumps: AROM;Both;10 reps Quad Sets: Strengthening;10 reps Gluteal Sets: Strengthening;10 reps Heel Slides: AAROM;10 reps Hip ABduction/ADduction: AAROM;10 reps Straight Leg Raises: AAROM;10 reps    General Comments        Pertinent Vitals/Pain Pain Assessment: 0-10 Pain Score: 6  Pain Location: Pt reporting only minimal pain in L pelvis t/o session that is not limiting Pain Descriptors / Indicators: Aching;Constant Pain Intervention(s): Limited activity within patient's tolerance;Monitored during session;Premedicated before  session    Home Living Family/patient expects to be discharged to:: Private residence Living Arrangements: Alone (caregivers 7 hours a day 3 days a week and her daughter stays Wed-Fri to help her with meals, cleaning etc) Available Help at Discharge: Family Type of Home: House Home Access: Level entry   Home Layout: One level Home Equipment: Environmental consultant - 2 wheels;Cane - single point Additional Comments: Home O2 (3 L/min via nasal cannula during day--usually uses with exertion); CPAP at night    Prior Function Level of Independence: Independent with assistive device(s)      Comments: Pt has been able to get around, but needing ADs more regularly    PT Goals  (current goals can now be found in the care plan section) Acute Rehab PT Goals Patient Stated Goal: to go to rehab Progress towards PT goals: Progressing toward goals    Frequency  7X/week    PT Plan Current plan remains appropriate    Co-evaluation             End of Session Equipment Utilized During Treatment: Gait belt Activity Tolerance: Patient tolerated treatment well Patient left: with chair alarm set;with call bell/phone within reach;with family/visitor present     Time: [id number] PT Time Calculation (min) (ACUTE ONLY): 26 min  Charges:  $Gait Training: 8-22 mins $Therapeutic Exercise: 8-22 mins                    G Codes:     Wayne Both, PT, DPT [phone]  Kreg Shropshire 09/14/2015, 2:21 PM

## 2015-09-14 NOTE — Plan of Care (Signed)
Problem: Safety: Goal: Ability to remain free from injury will improve Outcome: Progressing No injuries/safety issues this shift.  Problem: Skin Integrity: Goal: Risk for impaired skin integrity will decrease Outcome: Completed/Met Date Met:  09/14/15 No skin issues/concerns this shift.   Problem: Education: Goal: Knowledge of disease or condition will improve Outcome: Progressing Stroke handbook given to pt and family.  PT worked with pt today with some progress reports. Using walker for bed/chair transfers. Up in chair x 2 with pt doing active ROM. Pt limits some movements/activities related to pelvic pain. Pt reminded to be up only with assistance. Goal: Knowledge of secondary prevention will improve Outcome: Progressing Pt willing to perform self care activities as able. Pt has macular degeneration which she reports sitting up in chair, she can see better.  Problem: Self-Care: Goal: Ability to participate in self-care as condition permits will improve Outcome: Progressing No complications/issues this shift.     

## 2015-09-14 NOTE — Plan of Care (Signed)
Problem: Safety: Goal: Ability to remain free from injury will improve Outcome: Progressing Pt is a high fall risk, unable to walk. Calls for assistance when needed. Utilizes bedpan for bathroom needs. Granddaughter at bedside, supporitve.  Problem: Education: Goal: Knowledge of secondary prevention will improve Outcome: Progressing NIH of a 2 at this time. Unable to assess limbs on left side d/t recent injury. C/o pain on the left side ,oxycodone given with relief. NSR on telemetry with first degree HB.

## 2015-09-14 NOTE — Progress Notes (Addendum)
[facility] NAME: Marisa Hicks    MR#:  [id number]  DATE OF BIRTH:  [birth year]  SUBJECTIVE:   Patient continues to have pelvic pain however improved since admission. REVIEW OF SYSTEMS:    Review of Systems  Constitutional: Negative for fever, chills and malaise/fatigue.  HENT: Negative for sore throat.   Eyes: Negative for blurred vision.  Respiratory: Negative for cough, hemoptysis, shortness of breath and wheezing.   Cardiovascular: Negative for chest pain, palpitations and leg swelling.  Gastrointestinal: Positive for constipation. Negative for nausea, vomiting, abdominal pain, diarrhea and blood in stool.  Genitourinary: Negative for dysuria.  Musculoskeletal: Positive for joint pain and falls. Negative for back pain.  Neurological: Negative for dizziness, tremors, sensory change and headaches.  Endo/Heme/Allergies: Does not bruise/bleed easily.    Tolerating Diet: Yes      DRUG ALLERGIES:   Allergies  Allergen Reactions  . Ace Inhibitors   . Aliskiren-Hydrochlorothiazide Other (See Comments)  . Altace [Ramipril] Other (See Comments)    angioedema  . Atorvastatin Other (See Comments)  . Hydralazine Hcl   . Hydrochlorothiazide Other (See Comments)    hyponatremia  . Irbesartan-Hydrochlorothiazide Other (See Comments)  . Labetalol Hcl   . Rosuvastatin Other (See Comments)  . Tribenzor [Olmesartan-Amlodipine-Hctz] Other (See Comments)    Low sodium    VITALS:  Blood pressure 140/50, pulse 64, temperature 98.5 F (36.9 C), temperature source Oral, resp. rate 18, height 4\' 11"  (1.499 m), weight 55.339 kg (122 lb), SpO2 97 %.  PHYSICAL EXAMINATION:   Physical Exam  Constitutional: She is oriented to person, place, and time and well-developed, well-nourished, and in no distress. No distress.  HENT:  Head: Normocephalic.  Eyes: No scleral icterus.  Neck: Normal range of motion. Neck supple. No JVD  present. No tracheal deviation present.  Cardiovascular: Normal rate, regular rhythm and normal heart sounds.  Exam reveals no gallop and no friction rub.   No murmur heard. Pulmonary/Chest: Effort normal and breath sounds normal. No respiratory distress. She has no wheezes. She has no rales. She exhibits no tenderness.  Abdominal: Soft. Bowel sounds are normal. She exhibits no distension and no mass. There is no tenderness. There is no rebound and no guarding.  Musculoskeletal: Normal range of motion. She exhibits no edema.  Neurological: She is alert and oriented to person, place, and time.  Skin: Skin is warm. No rash noted. No erythema.  Psychiatric: Affect and judgment normal.      LABORATORY PANEL:   CBC  Recent Labs Lab 09/13/15 0536  WBC 17.9*  HGB 10.9*  HCT 32.6*  PLT 253   ------------------------------------------------------------------------------------------------------------------  Chemistries   Recent Labs Lab 09/11/15 1903  09/13/15 0536  NA  --   < > 133*  K  --   < > 4.0  CL  --   < > 102  CO2  --   < > 27  GLUCOSE  --   < > 128*  BUN  --   < > 26*  CREATININE  --   < > 0.75  CALCIUM  --   < > 8.3*  MG 1.7  --   --   < > = values in this interval not displayed. ------------------------------------------------------------------------------------------------------------------  Cardiac Enzymes  Recent Labs Lab 09/11/15 1346  TROPONINI <0.03   ------------------------------------------------------------------------------------------------------------------  RADIOLOGY:  Dg Chest 2 View  09/11/2015  CLINICAL DATA:  Pain posterior chest.  Fall this morning.  COPD.  EXAM: CHEST  2 VIEW COMPARISON:  Radiograph 05/22/2015 FINDINGS: Normal cardiac silhouette. Aorta is ectatic. There is chronic bronchitic markings again noted. No effusion, infiltrate or pneumothorax. Lungs are hyperinflated. Degenerative osteophytosis of the thoracic spine. IMPRESSION:  Hyperinflated lungs without acute findings. Electronically Signed   By: Suzy Bouchard M.D.   On: 09/11/2015 14:56   Dg Elbow Complete Left  09/11/2015  CLINICAL DATA:  Recent fall while using walker with left elbow pain, initial encounter EXAM: LEFT ELBOW - COMPLETE 3+ VIEW COMPARISON:  None. FINDINGS: No acute fracture or dislocation is noted. No significant joint effusion is seen. Soft tissue swelling is noted along the posterior aspect of the proximal forearm and elbow consistent with hematoma from the recent injury IMPRESSION: Soft tissue injury without acute bony abnormality. Electronically Signed   By: Inez Catalina M.D.   On: 09/11/2015 14:19   Mr Brain Wo Contrast  09/12/2015  CLINICAL DATA:  Fall yesterday.  Tingling.  Gait instability. EXAM: MRI HEAD WITHOUT CONTRAST TECHNIQUE: Multiplanar, multiecho pulse sequences of the brain and surrounding structures were obtained without intravenous contrast. COMPARISON:  MRI brain 09/11/2009 FINDINGS: A 2.5 cm acute nonhemorrhagic infarct is present within the right lentiform nucleus. This extends superiorly across the posterior limb of the right internal capsule. Remote lacunar infarcts are present in the basal ganglia bilaterally. Moderate diffuse white matter disease is present otherwise. No focal cortical infarcts are present. There is no hemorrhage. Flow is present in the major intracranial arteries. The globes and orbits are intact. Multiple foci of remote hemorrhage are present in the inferior cerebellum bilaterally. There are 2 foci of remote hemorrhage in the parietal lobes and 1 in the right thalamus. The paranasal sinuses are clear. There is some fluid in the right mastoid air cells. No obstructing nasopharyngeal lesion is evident. Skullbase is within normal limits. Midline sagittal images are unremarkable. Intracranial midline sagittal images are within normal limits. Degenerative changes are noted in the upper cervical spine IMPRESSION: 1. Acute  nonhemorrhagic infarct involving the right lentiform nucleus and posterior limb internal capsule. 2. Multiple other remote lacunar infarcts are present within the basal ganglia bilaterally. 3. Multiple punctate foci of remote hemorrhage in the cerebellum and bilateral parietal lobes is well is the right thalamus. This is nonspecific, but most commonly seen in the setting of a vasculitis, specifically amyloid angiopathy. 4. Moderate diffuse white matter disease likely reflects the sequela of chronic microvascular ischemia. These results will be called to the ordering clinician or representative by the Radiologist Assistant, and communication documented in the PACS or zVision Dashboard. Electronically Signed   By: San Morelle M.D.   On: 09/12/2015 16:59   US Carotid Bilateral  09/12/2015  CLINICAL DATA:  Stroke EXAM: BILATERAL CAROTID DUPLEX ULTRASOUND TECHNIQUE: Tranbarger scale imaging, color Doppler and duplex ultrasound were performed of bilateral carotid and vertebral arteries in the neck. COMPARISON:  09/11/2009 FINDINGS: Criteria: Quantification of carotid stenosis is based on velocity parameters that correlate the residual internal carotid diameter with NASCET-based stenosis levels, using the diameter of the distal internal carotid lumen as the denominator for stenosis measurement. The following velocity measurements were obtained: RIGHT ICA:  131 cm/sec CCA:  73 cm/sec SYSTOLIC ICA/CCA RATIO:  1.8 DIASTOLIC ICA/CCA RATIO:  3.1 ECA:  91 cm/sec LEFT ICA:  103 cm/sec CCA:  73 cm/sec SYSTOLIC ICA/CCA RATIO:  1.4 DIASTOLIC ICA/CCA RATIO:  1.1 ECA:  93 cm/sec RIGHT CAROTID ARTERY: Moderate calcified plaque in the bulb. Low resistance low resistance internal carotid  Doppler pattern. RIGHT VERTEBRAL ARTERY:  Antegrade.  Normal Doppler pattern. LEFT CAROTID ARTERY: Mild calcified plaque along the wall of the bulb. Low resistance internal carotid Doppler pattern. LEFT VERTEBRAL ARTERY: Antegrade with a low  resistance Doppler pattern IMPRESSION: Less than 50% stenosis in the left internal carotid artery. 50-69% stenosis in the right internal carotid artery. This has progressed since the prior study. Electronically Signed   By: Marybelle Killings M.D.   On: 09/12/2015 15:43   Dg Hip Unilat With Pelvis 2-3 Views Left  09/11/2015  CLINICAL DATA:  Fall with left hip pain.  Initial encounter. EXAM: DG HIP (WITH OR WITHOUT PELVIS) 2-3V LEFT COMPARISON:  None. FINDINGS: Acute fracture of the left obturator ring with segmental fracturing of the superior pubic ramus at the body and puboacetabular junction and comminuted fracturing at the medial inferior ramus. The superior ramus is mildly displaced. No evidence of hip fracture or dislocation. Based on fracture pattern a sacral insufficiency fracture is expected but not visualized. Osteopenia. Left hip osteoarthritis with spurring but no joint narrowing. Calcification from left gluteal tendinitis at the greater trochanter. IMPRESSION: Left obturator ring fractures as described above. Electronically Signed   By: Monte Fantasia M.D.   On: 09/11/2015 12:18     ASSESSMENT AND PLAN:   81 year old female status post fall who presented with pelvic pain and found to have multiple pelvic fractures.  1.  Acute nonhemorrhagic infarct involving the right lentiform nucleus and posterior limb internal capsule without much residual deficit: Patient is found to have an acute stroke which probably led to her fall and subsequently her pelvic fractures. MRI shows previous strokes and possible amyloid angiopathy.Patient is currently on aspirin and Plavix as well as statin. She has been evaluated by both neurology and vascular surgery. As per neurology recommendations continue Plavix and statin patient also does need aspirin so she will continue both of these. Her carotid ultrasound showed 50-69% right internal carotid artery stenosis. As per vascular surgery consultation given her age and  other comorbidities, clearly not at a level where surgery would be considered. They're recommending outpatient follow-up and consider rechecking on 6 month intervals. 2. Multiple pelvic fractures after fall: Continue supportive pain management. Patient will need skilled nursing facility which is planned for Monday.  3. Leukocytosis: . This was likely elevated due to acute phase reactant and/or steroids.. Chest x-ray shows no acute infiltrate. White blood cell count has improved.  4. Palpitations with history of SVT: Appreciate cartilage in consultation. 2-D echocardiogram showed normal ejection fraction and moderate aortic valve stenosis. Continue sotalol.  5. Sinusitis: Continue prednisone taper.  6. Sleep apnea: Continue CPAP at night.  Management plans discussed with the patient and she is in agreement.  CODE STATUS: DNR  TOTAL TIME TAKING CARE OF THIS PATIENT: 25 minutes.     POSSIBLE D/C  tomorrow  DEPENDING ON CLINICAL CONDITION.   Gregori Abril M.D on 09/14/2015 at 9:46 AM  Between 7am to 6pm - Pager - [phone] After 6pm go to [email or web address] - password [email or web address] [facility]  Office  [phone]  CC: Primary care physician; Enid Derry, MD  Note: This dictation was prepared with Dragon dictation along with smaller phrase technology. Any transcriptional errors that result from this process are unintentional.

## 2015-09-14 NOTE — Clinical Social Work Note (Signed)
Patient's daughter, [name], contacted CSW to inform that the family has chosen [facility] for patient to go to at discharge. CSW has sent an acceptance of bed offer message through epic. Shela Leff MSW,LCSW [phone]

## 2015-09-14 NOTE — Evaluation (Signed)
Occupational Therapy Evaluation Patient Details Name: Marisa Hicks MRN: [id number] DOB: 12/07/1921 Today's Date: 09/14/2015    History of Present Illness Pt had a fall while using walker suffering L pelvic fracture and L side rib pain. She has a sitter 7 hours a day 3 days a week and her daughter comes Wed-Friday to help with cooking, cleaning, etc.   Clinical Impression   Pt seen with her grandaughter [name] in room at times with her and updated about rec on how to help pt with center of balance and rec feeding equipment to increase independence with self feeding.  Pt has decreased vision from macular degeneration prior to admission and presents with decreased fine motor skills bilaterally left more than right but requires mod assist for self feeding and would benefit from a plate with 3 sections with raised sides to scoop against, dycem to hold plate and cup with 2 handles.  She has pain with any movement from pelvic and rib fractures and requires total assist for LB dressing and bathing skills and extra time for sequencing and motor planning for grooming skills and UB bathing and dressing skills.  Rec continued OT for ADl retraining and SNF for continued rehab to increased function to PLOF and assess safety for DC since she was living at home alone with hired sitters and her daughter helping Wed-Fri.      Follow Up Recommendations  SNF    Equipment Recommendations   (rec feeding equipment ie bowl and plate with separater, increased side of bowl, curved spoon, dycem)    Recommendations for Other Services       Precautions / Restrictions Precautions Precautions: Fall Restrictions Weight Bearing Restrictions: Yes LLE Weight Bearing: Weight bearing as tolerated      Mobility Bed Mobility                  Transfers                      Balance                                            ADL Overall ADL's : Needs assistance/impaired                                        General ADL Comments: Pt requires mod assist for self feeding due to decreased coordination and motor planning, dependent for LB dressing tasks due to pain and decreased ability to hold self in midline for ADLs from CVA and decreased balance.  Mod asist for grooming skills to open toothpaste and  needs extra time for planning and sequencing.     Vision     Perception     Praxis      Pertinent Vitals/Pain Pain Assessment: 0-10 Pain Score: 6  Pain Location: pelvis and back Pain Descriptors / Indicators: Aching;Constant Pain Intervention(s): Limited activity within patient's tolerance;Monitored during session;Premedicated before session     Hand Dominance Right   Extremity/Trunk Assessment Upper Extremity Assessment Upper Extremity Assessment: Generalized weakness;LUE deficits/detail LUE Deficits / Details: Pt with intact sensation but overall weak and decreased fine motor coordination bilaterally with difficulty feeding self LUE Coordination: decreased fine motor;decreased gross motor   Lower Extremity Assessment Lower Extremity Assessment: Defer to PT  evaluation       Communication Communication Communication: No difficulties   Cognition Arousal/Alertness: Awake/alert Behavior During Therapy: WFL for tasks assessed/performed Overall Cognitive Status: Within Functional Limits for tasks assessed                     General Comments       Exercises       Shoulder Instructions      Home Living Family/patient expects to be discharged to:: Private residence Living Arrangements: Alone (caregivers 7 hours a day 3 days a week and her daughter stays Wed-Fri to help her with meals, cleaning etc) Available Help at Discharge: Family Type of Home: House Home Access: Level entry     Home Layout: One level     Bathroom Shower/Tub: Occupational psychologist: Standard Bathroom Accessibility: Yes   Home  Equipment: Environmental consultant - 2 wheels;Cane - single point   Additional Comments: Home O2 (3 L/min via nasal cannula during day--usually uses with exertion); CPAP at night      Prior Functioning/Environment Level of Independence: Independent with assistive device(s)        Comments: Pt has been able to get around, but needing ADs more regularly     OT Diagnosis: Generalized weakness;Acute pain;Disturbance of vision   OT Problem List: Decreased strength;Decreased range of motion;Decreased activity tolerance;Pain;Impaired balance (sitting and/or standing);Decreased coordination   OT Treatment/Interventions: Self-care/ADL training;Neuromuscular education;DME and/or AE instruction;Patient/family education    OT Goals(Current goals can be found in the care plan section) Acute Rehab OT Goals Patient Stated Goal: to go to rehab OT Goal Formulation: With patient/family Time For Goal Achievement: 09/28/15 Potential to Achieve Goals: Good  OT Frequency: Min 1X/week   Barriers to D/C:    had assistance at home with sitter and daughter but was home alone at times and most likely won't be safe at home after DC       Co-evaluation              End of Session Equipment Utilized During Treatment:  (AD catalog reviewed with pt and [name], grandaughter)  Activity Tolerance: Patient limited by pain Patient left: in bed;with call bell/phone within reach;with bed alarm set;with family/visitor present   Time: [id number] OT Time Calculation (min): 40 min Charges:  OT General Charges $OT Visit: 1 Procedure OT Evaluation $Initial OT Evaluation Tier I: 1 Procedure OT Treatments $Self Care/Home Management : 8-22 mins $Therapeutic Activity: 8-22 mins G-Codes:    Tatumn Corbridge 09/21/15, 12:02 PM    Chrys Racer, OTR/L ascom [phone]

## 2015-09-15 DIAGNOSIS — R296 Repeated falls: Secondary | ICD-10-CM | POA: Diagnosis not present

## 2015-09-15 DIAGNOSIS — S329XXA Fracture of unspecified parts of lumbosacral spine and pelvis, initial encounter for closed fracture: Secondary | ICD-10-CM | POA: Diagnosis not present

## 2015-09-15 DIAGNOSIS — D638 Anemia in other chronic diseases classified elsewhere: Secondary | ICD-10-CM | POA: Diagnosis not present

## 2015-09-15 DIAGNOSIS — I638 Other cerebral infarction: Secondary | ICD-10-CM | POA: Diagnosis not present

## 2015-09-15 DIAGNOSIS — R5381 Other malaise: Secondary | ICD-10-CM | POA: Diagnosis not present

## 2015-09-15 DIAGNOSIS — R05 Cough: Secondary | ICD-10-CM | POA: Diagnosis not present

## 2015-09-15 DIAGNOSIS — S329XXD Fracture of unspecified parts of lumbosacral spine and pelvis, subsequent encounter for fracture with routine healing: Secondary | ICD-10-CM | POA: Diagnosis not present

## 2015-09-15 DIAGNOSIS — Z5189 Encounter for other specified aftercare: Secondary | ICD-10-CM | POA: Diagnosis not present

## 2015-09-15 DIAGNOSIS — K5900 Constipation, unspecified: Secondary | ICD-10-CM | POA: Diagnosis not present

## 2015-09-15 DIAGNOSIS — R1312 Dysphagia, oropharyngeal phase: Secondary | ICD-10-CM | POA: Diagnosis not present

## 2015-09-15 DIAGNOSIS — E46 Unspecified protein-calorie malnutrition: Secondary | ICD-10-CM | POA: Diagnosis not present

## 2015-09-15 DIAGNOSIS — R1310 Dysphagia, unspecified: Secondary | ICD-10-CM | POA: Diagnosis not present

## 2015-09-15 DIAGNOSIS — R278 Other lack of coordination: Secondary | ICD-10-CM | POA: Diagnosis not present

## 2015-09-15 DIAGNOSIS — J449 Chronic obstructive pulmonary disease, unspecified: Secondary | ICD-10-CM | POA: Diagnosis not present

## 2015-09-15 DIAGNOSIS — S3282XA Multiple fractures of pelvis without disruption of pelvic ring, initial encounter for closed fracture: Secondary | ICD-10-CM | POA: Diagnosis not present

## 2015-09-15 DIAGNOSIS — G4700 Insomnia, unspecified: Secondary | ICD-10-CM | POA: Diagnosis not present

## 2015-09-15 DIAGNOSIS — M25552 Pain in left hip: Secondary | ICD-10-CM | POA: Diagnosis not present

## 2015-09-15 DIAGNOSIS — E871 Hypo-osmolality and hyponatremia: Secondary | ICD-10-CM | POA: Diagnosis not present

## 2015-09-15 DIAGNOSIS — R269 Unspecified abnormalities of gait and mobility: Secondary | ICD-10-CM | POA: Diagnosis not present

## 2015-09-15 DIAGNOSIS — I69298 Other sequelae of other nontraumatic intracranial hemorrhage: Secondary | ICD-10-CM | POA: Diagnosis not present

## 2015-09-15 DIAGNOSIS — M79605 Pain in left leg: Secondary | ICD-10-CM | POA: Diagnosis not present

## 2015-09-15 DIAGNOSIS — F419 Anxiety disorder, unspecified: Secondary | ICD-10-CM | POA: Diagnosis not present

## 2015-09-15 DIAGNOSIS — R2681 Unsteadiness on feet: Secondary | ICD-10-CM | POA: Diagnosis not present

## 2015-09-15 DIAGNOSIS — J019 Acute sinusitis, unspecified: Secondary | ICD-10-CM | POA: Diagnosis not present

## 2015-09-15 DIAGNOSIS — T148 Other injury of unspecified body region: Secondary | ICD-10-CM | POA: Diagnosis not present

## 2015-09-15 DIAGNOSIS — I10 Essential (primary) hypertension: Secondary | ICD-10-CM | POA: Diagnosis not present

## 2015-09-15 DIAGNOSIS — M6281 Muscle weakness (generalized): Secondary | ICD-10-CM | POA: Diagnosis not present

## 2015-09-15 DIAGNOSIS — R262 Difficulty in walking, not elsewhere classified: Secondary | ICD-10-CM | POA: Diagnosis not present

## 2015-09-15 DIAGNOSIS — I639 Cerebral infarction, unspecified: Secondary | ICD-10-CM

## 2015-09-15 DIAGNOSIS — S32810S Multiple fractures of pelvis with stable disruption of pelvic ring, sequela: Secondary | ICD-10-CM | POA: Diagnosis not present

## 2015-09-15 DIAGNOSIS — G4733 Obstructive sleep apnea (adult) (pediatric): Secondary | ICD-10-CM | POA: Diagnosis not present

## 2015-09-15 DIAGNOSIS — B372 Candidiasis of skin and nail: Secondary | ICD-10-CM | POA: Diagnosis not present

## 2015-09-15 DIAGNOSIS — J441 Chronic obstructive pulmonary disease with (acute) exacerbation: Secondary | ICD-10-CM | POA: Diagnosis not present

## 2015-09-15 DIAGNOSIS — D72829 Elevated white blood cell count, unspecified: Secondary | ICD-10-CM | POA: Diagnosis not present

## 2015-09-15 LAB — CBC
HCT: 32.9 % — ABNORMAL LOW (ref 35.0–47.0)
HEMOGLOBIN: 11.1 g/dL — AB (ref 12.0–16.0)
MCH: 30.4 pg (ref 26.0–34.0)
MCHC: 33.6 g/dL (ref 32.0–36.0)
MCV: 90.3 fL (ref 80.0–100.0)
PLATELETS: 279 10*3/uL (ref 150–440)
RBC: 3.64 MIL/uL — ABNORMAL LOW (ref 3.80–5.20)
RDW: 13.6 % (ref 11.5–14.5)
WBC: 22.5 10*3/uL — ABNORMAL HIGH (ref 3.6–11.0)

## 2015-09-15 LAB — CBC AND DIFFERENTIAL: WBC: 22.5 10^3/mL

## 2015-09-15 MED ORDER — FLEET ENEMA 7-19 GM/118ML RE ENEM
1.0000 | ENEMA | Freq: Once | RECTAL | Status: AC
  Administered 2015-09-15: 1 via RECTAL

## 2015-09-15 MED ORDER — ALPRAZOLAM 0.25 MG PO TABS: 0.2500 mg | ORAL_TABLET | Freq: Every day | ORAL | Status: AC

## 2015-09-15 MED ORDER — LACTULOSE 10 GM/15ML PO SOLN
30.0000 g | Freq: Two times a day (BID) | ORAL | Status: DC
  Administered 2015-09-15: 10:00:00 30 g via ORAL
  Filled 2015-09-15: qty 60

## 2015-09-15 MED ORDER — OXYCODONE-ACETAMINOPHEN 5-325 MG PO TABS: 1.0000 | ORAL_TABLET | ORAL | Status: DC | PRN

## 2015-09-15 MED ORDER — BISACODYL 10 MG RE SUPP
10.0000 mg | Freq: Every day | RECTAL | Status: DC
  Administered 2015-09-15: 10 mg via RECTAL
  Filled 2015-09-15: qty 1

## 2015-09-15 MED ORDER — POLYETHYLENE GLYCOL 3350 17 G PO PACK: 17.0000 g | PACK | Freq: Every day | ORAL | Status: AC

## 2015-09-15 MED ORDER — PREDNISONE 20 MG PO TABS: 20.0000 mg | ORAL_TABLET | Freq: Every day | ORAL | Status: DC

## 2015-09-15 NOTE — Clinical Social Work Note (Signed)
Patient to discharge today to [facility]. CSW met with patient and patient's son: Gwyndolyn Saxon this morning and called [name]; patient's daughter as well to notify of the discharge. All are in agreement with discharge today to [facility]. CSW confirmed with admission's coordinator: Ivin Booty at [facility] that patient could go ahead and come today and she stated that she has spoken to patient's daughter, [name] and everything was good for patient to come today. Discharge information was sent this morning to [facility]. CSW confirmed with family that they wish for EMS transport today. CSW has confirmed that patient has a home CPAP and patient's son, Will stated that they have packed it up to take to [facility] today. Shela Leff MSW,LCSW [phone]

## 2015-09-15 NOTE — Progress Notes (Signed)
MD making rounds. Discharge orders received. Clinical Education officer, museum (CSW) facilitating discharge to [facility]. Discharge packet being prepared. Telemetry Removed. IV removed. Vital Signs Stable. No signs and/or symptoms of distress noted. Denies pain. Denies needs. Report called to Kathlee Nations, Therapist, sports at [facility]. No unanswered questions. EMS called for transport. Awaiting EMS. EMS on Unit for transport. Discharge Packet given to EMS transporter. Belongings sent with family.

## 2015-09-15 NOTE — Discharge Summary (Signed)
[facility] NAME: Marisa Hicks    MR#:  [id number]  DATE OF BIRTH:  02/05/1922  DATE OF ADMISSION:  09/11/2015 ADMITTING PHYSICIAN: Demetrios Loll, MD  DATE OF DISCHARGE: 09/15/2015 PRIMARY CARE PHYSICIAN: Enid Derry, MD    ADMISSION DIAGNOSIS:  Palpitations [R00.2] Fall, initial encounter [W19.XXXA] Left elbow contusion, initial encounter [S50.02XA] Fracture of left pelvis, closed, initial encounter ([place]) [S32.9XXA]  DISCHARGE DIAGNOSIS:  Principal Problem:   Pelvic fracture, closed, initial encounter Acute nonhemorrhagic infarct involving the right lentiform nucleus and posterior limb internal capsule   SECONDARY DIAGNOSIS:   Past Medical History  Diagnosis Date  . Renal disorder   . Arthritis   . Osteoarthritis   . CHF (congestive heart failure) ([place])   . Dyspnea   . CAD (coronary artery disease)   . COPD (chronic obstructive pulmonary disease) ([place])   . Hypertension   . Hyperlipidemia   . Anxiety   . Allergy   . Osteoporosis   . Proteinuria   . Lymphocytosis   . Dysphagia   . Monocytosis   . Hyponatremia   . Pulmonary hypertension ([place])   . Left ventricular hypertrophy   . Hypertriglyceridemia     HOSPITAL COURSE:   81 year old female status post fall who presented with pelvic pain and found to have multiple pelvic fractures.  1. Acute nonhemorrhagic infarct involving the right lentiform nucleus and posterior limb internal capsule without much residual deficit: Patient is found to have an acute stroke which probably led to her fall and subsequently her pelvic fractures. MRI shows previous strokes and possible amyloid angiopathy.Patient is currently on aspirin and Plavix as well as statin. She has been evaluated by both neurology and vascular surgery. As per neurology recommendations continue Plavix and statin patient also does need aspirin so she will continue both of these. Her carotid ultrasound showed  50-69% right internal carotid artery stenosis. As per vascular surgery consultation given her age and other comorbidities, clearly not at a level where surgery would be considered. Dr Lucky Cowboy is recommending outpatient follow-up and consider rechecking on 6 month intervals.   2. Multiple pelvic fractures after fall: Continue supportive pain management. Patient will need skilled nursing facility which is planned for Monday.  3. Leukocytosis: Leukocytosis is likely secondary to her oral steroids which she is being tapered off on. She was started on the steroids and antibiotic prior to admission to the hospital.. She has no acute signs of infection. CBC should be repeated in 2 days.   4. Palpitations with history of SVT: [place] cardiology consultation. 2-D echocardiogram showed normal ejection fraction and moderate aortic valve stenosis. Continue sotalol.  5. Sinusitis: Continue prednisone taper. Repeat CBC in 2 days 6. Sleep apnea: Continue CPAP at night.She also wears PRN SOB O2 @ 2 liters [place] during day   DISCHARGE CONDITIONS AND DIET:   heart healthy  Stable for discharge to skilled nursing facility  CONSULTS OBTAINED:  Treatment Team:  Dionisio David, MD Hessie Knows, MD Isaias Cowman, MD Algernon Huxley, MD  DRUG ALLERGIES:   Allergies  Allergen Reactions  . Ace Inhibitors   . Aliskiren-Hydrochlorothiazide Other (See Comments)  . Altace [Ramipril] Other (See Comments)    angioedema  . Atorvastatin Other (See Comments)  . Hydralazine Hcl   . Hydrochlorothiazide Other (See Comments)    hyponatremia  . Irbesartan-Hydrochlorothiazide Other (See Comments)  . Labetalol Hcl   . Rosuvastatin Other (See Comments)  . Tribenzor [Olmesartan-Amlodipine-Hctz]  Other (See Comments)    Low sodium    DISCHARGE MEDICATIONS:   Current Discharge Medication List    START taking these medications   Details  oxyCODONE-acetaminophen (PERCOCET/ROXICET) 5-325 MG tablet Take 1 tablet by  mouth every 4 (four) hours as needed for moderate pain. Qty: 30 tablet, Refills: 0    polyethylene glycol (MIRALAX / GLYCOLAX) packet Take 17 g by mouth daily. Qty: 14 each, Refills: 0      CONTINUE these medications which have CHANGED   Details  ALPRAZolam (XANAX) 0.25 MG tablet Take 1 tablet (0.25 mg total) by mouth at bedtime. Qty: 30 tablet, Refills: 0      CONTINUE these medications which have NOT CHANGED   Details  acidophilus (RISAQUAD) CAPS capsule Take 1 capsule by mouth daily.    albuterol (PROVENTIL HFA;VENTOLIN HFA) 108 (90 BASE) MCG/ACT inhaler Inhale 2 puffs into the lungs every 6 (six) hours as needed for wheezing or shortness of breath.    albuterol (PROVENTIL) (2.5 MG/3ML) 0.083% nebulizer solution Take 3 mLs (2.5 mg total) by nebulization every 4 (four) hours as needed for wheezing or shortness of breath. Do not use with your rescue inhaler; just one or the other Qty: 50 mL, Refills: 1    amLODipine (NORVASC) 10 MG tablet Take 10 mg by mouth daily.    aspirin EC 81 MG tablet Take 81 mg by mouth daily.    calcium-vitamin D (OSCAL WITH D) 500-200 MG-UNIT per tablet Take 1 tablet by mouth daily.     Cholecalciferol (VITAMIN D3) 2000 UNITS TABS Take 2,000 Units by mouth daily.    clopidogrel (PLAVIX) 75 MG tablet Take 75 mg by mouth daily.    fexofenadine (ALLEGRA) 180 MG tablet Take 180 mg by mouth daily.    meclizine (ANTIVERT) 25 MG tablet Take 25 mg by mouth daily as needed for dizziness.    Melatonin 3 MG TABS Take 3 mg by mouth at bedtime.    mometasone (NASONEX) 50 MCG/ACT nasal spray Place 2 sprays into the nose at bedtime.     Multiple Vitamins-Minerals (MULTIVITAMIN WITH MINERALS) tablet Take 1 tablet by mouth daily.    olmesartan (BENICAR) 40 MG tablet Take 40 mg by mouth daily.    Omega-3 Fatty Acids (OMEGA-3 FISH OIL) 1200 MG CAPS Take 1,200 mg by mouth daily.    raloxifene (EVISTA) 60 MG tablet Take 60 mg by mouth daily.    simvastatin  (ZOCOR) 40 MG tablet Take 40 mg by mouth at bedtime.     sotalol (BETAPACE) 80 MG tablet Take 1 tablet (80 mg total) by mouth every 12 (twelve) hours. Qty: 60 tablet, Refills: 0    Umeclidinium-Vilanterol (ANORO ELLIPTA) 62.5-25 MCG/INH AEPB Inhale 1 puff into the lungs daily.    vitamin C (ASCORBIC ACID) 500 MG tablet Take 500 mg by mouth daily.    chlorpheniramine-HYDROcodone (TUSSIONEX PENNKINETIC ER) 10-8 MG/5ML SUER Take 5 mLs by mouth every 12 (twelve) hours as needed for cough. Qty: 115 mL, Refills: 0     predniSONE (DELTASONE) 20 MG tablet for 3 days then 10 mg for 3 days and stop       STOP taking these medications     amoxicillin-clavulanate (AUGMENTIN) 500-125 MG tablet                     Today   CHIEF COMPLAINT:  Patient is feeling well this morning. Patient still has not had a bowel movement. Patient denies abdominal pain or  nausea. Patient's pelvic pain has improved. She has no focal neurological deficits.   VITAL SIGNS:  Blood pressure 169/62, pulse 68, temperature 99.2 F (37.3 C), temperature source Oral, resp. rate 20, height 4\' 11"  (1.499 m), weight 55.339 kg (122 lb), SpO2 94 %.   REVIEW OF SYSTEMS:  Review of Systems  Constitutional: Negative for fever, chills and malaise/fatigue.  HENT: Negative for sore throat.   Eyes: Negative for blurred vision.  Respiratory: Negative for cough, hemoptysis, shortness of breath and wheezing.   Cardiovascular: Negative for chest pain, palpitations and leg swelling.  Gastrointestinal: Positive for constipation. Negative for nausea, vomiting, abdominal pain, diarrhea and blood in stool.  Genitourinary: Negative for dysuria.  Musculoskeletal: Positive for joint pain. Negative for back pain.  Neurological: Negative for dizziness, tremors and headaches.  Endo/Heme/Allergies: Does not bruise/bleed easily.     PHYSICAL EXAMINATION:  GENERAL:  81 y.o.-year-old patient lying in the bed with no acute distress.   NECK:  Supple, no jugular venous distention. No thyroid enlargement, no tenderness.  LUNGS: Normal breath sounds bilaterally, no wheezing, rales,rhonchi  No use of accessory muscles of respiration.  CARDIOVASCULAR: S1, S2 normal. No murmurs, rubs, or gallops.  ABDOMEN: Soft, non-tender, non-distended. Bowel sounds present. No organomegaly or mass.  EXTREMITIES: No pedal edema, cyanosis, or clubbing.  PSYCHIATRIC: The patient is alert and oriented x 3.  SKIN: No obvious rash, lesion, or ulcer.   DATA REVIEW:   CBC  Recent Labs Lab 09/15/15 0602  WBC 22.5*  HGB 11.1*  HCT 32.9*  PLT 279    Chemistries   Recent Labs Lab 09/11/15 1903  09/13/15 0536  NA  --   < > 133*  K  --   < > 4.0  CL  --   < > 102  CO2  --   < > 27  GLUCOSE  --   < > 128*  BUN  --   < > 26*  CREATININE  --   < > 0.75  CALCIUM  --   < > 8.3*  MG 1.7  --   --   < > = values in this interval not displayed.  Cardiac Enzymes  Recent Labs Lab 09/11/15 1346  TROPONINI <0.03    Microbiology Results  @[email or web address] @  RADIOLOGY:  No results found.    Management plans discussed with the patient and she is in agreement. Stable for discharge SNF  Patient should follow up with PCP  CODE STATUS:     Code Status Orders        Start     Ordered   09/11/15 1722  Do not attempt resuscitation (DNR)   Continuous    Question Answer Comment  In the event of cardiac or respiratory ARREST Do not call a "code blue"   In the event of cardiac or respiratory ARREST Do not perform Intubation, CPR, defibrillation or ACLS   In the event of cardiac or respiratory ARREST Use medication by any route, position, wound care, and other measures to relive pain and suffering. May use oxygen, suction and manual treatment of airway obstruction as needed for comfort.      09/11/15 1721    Advance Directive Documentation        Most Recent Value   Type of Advance Directive  Living will   Pre-existing out of facility  DNR order (yellow form or pink MOST form)     "MOST" Form in Place?        TOTAL  TIME TAKING CARE OF THIS PATIENT: 35 minutes.    Note: This dictation was prepared with Dragon dictation along with smaller phrase technology. Any transcriptional errors that result from this process are unintentional.  Dereke Neumann M.D on 09/15/2015 at 7:33 AM  Between 7am to 6pm - Pager - [phone] After 6pm go to [email or web address] - password [email or web address] [facility]  Office  [phone]  CC: Primary care physician; Enid Derry, MD

## 2015-09-15 NOTE — Care Management Important Message (Signed)
Important Message  Patient Details  Name: Marisa Hicks MRN: [id number] Date of Birth: 01/26/1922   Medicare Important Message Given:  Yes    Juliann Pulse A Taniah Reinecke 09/15/2015, 10:23 AM

## 2015-09-15 NOTE — Clinical Social Work Placement (Signed)
   CLINICAL SOCIAL WORK PLACEMENT  NOTE  Date:  09/15/2015  Patient Details  Name: Marisa Hicks MRN: [id number] Date of Birth: March 10, 1922  Clinical Social Work is seeking post-discharge placement for this patient at the [place] level of care (*CSW will initial, date and re-position this form in  chart as items are completed):  Yes   Patient/family provided with [facility] Work Department's list of facilities offering this level of care within the geographic area requested by the patient (or if unable, by the patient's family).  Yes   Patient/family informed of their freedom to choose among providers that offer the needed level of care, that participate in Medicare, Medicaid or managed care program needed by the patient, have an available bed and are willing to accept the patient.  Yes   Patient/family informed of [facility]'s ownership interest in [facility] and [facility], as well as of the fact that they are under no obligation to receive care at these facilities.  PASRR submitted to EDS on 09/12/15     PASRR number received on 09/12/15     Existing PASRR number confirmed on       FL2 transmitted to all facilities in geographic area requested by pt/family on 09/12/15     FL2 transmitted to all facilities within larger geographic area on 09/12/15     Patient informed that his/her managed care company has contracts with or will negotiate with certain facilities, including the following:        Yes   Patient/family informed of bed offers received.  Patient chooses bed at  [facility])     Physician recommends and patient chooses bed at  [facility])    Patient to be transferred to  [facility]) on 09/15/15.  Patient to be transferred to facility by  (EMS)     Patient family notified on   of transfer.  Name of family member notified:   [name] and Will)     PHYSICIAN       Additional Comment:     _______________________________________________ Shela Leff, LCSW 09/15/2015, 11:35 AM

## 2015-09-15 NOTE — Plan of Care (Signed)
Problem: Safety: Goal: Ability to remain free from injury will improve Outcome: Progressing On high falls precautions per policy.     Problem: Education: Goal: Knowledge of disease or condition will improve Outcome: Progressing Stroke education given.  Problem: Tissue Perfusion: Goal: Complications of Ischemic Stroke will be minimized. Outcome: Progressing PRN pain meds given for pain with improvement. NIH=1

## 2015-09-15 NOTE — Progress Notes (Signed)
Rehab Admissions Coordinator Note:  Patient was screened by Cleatrice Burke for appropriateness for an Inpatient Acute Rehab Consult per PT recommendation yesterday. Noted pt and family have accepted bed offer at [facility].  At this time, I will not pursue an inpt rehab consultation for bed offer made and pt from home alone. Would not be able to return home alone after short rehab stay with pelvic fracture and CVA.  Please contact me if you have any questions.  Cleatrice Burke 09/15/2015, 7:30 AM  I can be reached at [phone].

## 2015-09-16 ENCOUNTER — Encounter: Payer: Self-pay | Admitting: Adult Health

## 2015-09-17 ENCOUNTER — Non-Acute Institutional Stay (SKILLED_NURSING_FACILITY): Payer: Medicare Other | Admitting: Internal Medicine

## 2015-09-17 DIAGNOSIS — R2681 Unsteadiness on feet: Secondary | ICD-10-CM

## 2015-09-17 DIAGNOSIS — M6281 Muscle weakness (generalized): Secondary | ICD-10-CM | POA: Diagnosis not present

## 2015-09-17 DIAGNOSIS — K5900 Constipation, unspecified: Secondary | ICD-10-CM | POA: Diagnosis not present

## 2015-09-17 DIAGNOSIS — E871 Hypo-osmolality and hyponatremia: Secondary | ICD-10-CM

## 2015-09-17 DIAGNOSIS — R1310 Dysphagia, unspecified: Secondary | ICD-10-CM

## 2015-09-17 DIAGNOSIS — J449 Chronic obstructive pulmonary disease, unspecified: Secondary | ICD-10-CM | POA: Diagnosis not present

## 2015-09-17 DIAGNOSIS — D638 Anemia in other chronic diseases classified elsewhere: Secondary | ICD-10-CM

## 2015-09-17 DIAGNOSIS — J019 Acute sinusitis, unspecified: Secondary | ICD-10-CM

## 2015-09-17 DIAGNOSIS — D72829 Elevated white blood cell count, unspecified: Secondary | ICD-10-CM | POA: Diagnosis not present

## 2015-09-17 DIAGNOSIS — S32810S Multiple fractures of pelvis with stable disruption of pelvic ring, sequela: Secondary | ICD-10-CM

## 2015-09-17 DIAGNOSIS — G4700 Insomnia, unspecified: Secondary | ICD-10-CM

## 2015-09-17 DIAGNOSIS — M25552 Pain in left hip: Secondary | ICD-10-CM | POA: Diagnosis not present

## 2015-09-17 DIAGNOSIS — I471 Supraventricular tachycardia, unspecified: Secondary | ICD-10-CM

## 2015-09-17 DIAGNOSIS — I639 Cerebral infarction, unspecified: Secondary | ICD-10-CM | POA: Diagnosis not present

## 2015-09-17 DIAGNOSIS — R262 Difficulty in walking, not elsewhere classified: Secondary | ICD-10-CM | POA: Diagnosis not present

## 2015-09-17 DIAGNOSIS — G4733 Obstructive sleep apnea (adult) (pediatric): Secondary | ICD-10-CM

## 2015-09-17 DIAGNOSIS — J441 Chronic obstructive pulmonary disease with (acute) exacerbation: Secondary | ICD-10-CM

## 2015-09-17 DIAGNOSIS — R5381 Other malaise: Secondary | ICD-10-CM | POA: Diagnosis not present

## 2015-09-17 DIAGNOSIS — S329XXA Fracture of unspecified parts of lumbosacral spine and pelvis, initial encounter for closed fracture: Secondary | ICD-10-CM | POA: Diagnosis not present

## 2015-09-17 LAB — BASIC METABOLIC PANEL
BUN: 29 mg/dL — AB (ref 4–21)
Creatinine: 0.9 mg/dL (ref 0.5–1.1)
Glucose: 95 mg/dL
Potassium: 4.2 mmol/L (ref 3.4–5.3)
Sodium: 134 mmol/L — AB (ref 137–147)

## 2015-09-17 LAB — HEPATIC FUNCTION PANEL
ALK PHOS: 49 U/L (ref 25–125)
ALT: 7 U/L (ref 7–35)
AST: 13 U/L (ref 13–35)
BILIRUBIN, TOTAL: 0.6 mg/dL

## 2015-09-17 LAB — CBC AND DIFFERENTIAL
HCT: 33 % — AB (ref 36–46)
HEMOGLOBIN: 10.8 g/dL — AB (ref 12.0–16.0)
NEUTROS ABS: 10 /uL
PLATELETS: 319 10*3/uL (ref 150–399)
WBC: 18.1 10*3/mL

## 2015-09-17 NOTE — Progress Notes (Signed)
Patient ID: Marisa Hicks, female   DOB: March 11, 1922, 81 y.o.   MRN: [id number]  .    [facility]   PCP: Enid Derry, MD  Code Status: DNR  Allergies  Allergen Reactions  . Ace Inhibitors   . Aliskiren-Hydrochlorothiazide Other (See Comments)  . Altace [Ramipril] Other (See Comments)    angioedema  . Atorvastatin Other (See Comments)  . Hydralazine Hcl   . Hydrochlorothiazide Other (See Comments)    hyponatremia  . Irbesartan-Hydrochlorothiazide Other (See Comments)  . Labetalol Hcl   . Rosuvastatin Other (See Comments)  . Tribenzor [Olmesartan-Amlodipine-Hctz] Other (See Comments)    Low sodium    Chief Complaint  Patient presents with  . New Admit To SNF     HPI:  81 y.o. patient is here for short term rehabilitation post hospital admission from 09/11/15-09/15/15 post fall with closed pelvic fracture and acute nonhemorrhagic infarct involving right lentiform nucleus and posterior limb internal capsule. She was seen by neurology and plavix and statin were recommended. Her aspirin was discontinued. She was seen by orthopedics and conservative management was recommended. She has dysphagia. She complaints of pain in her pelvic area with movement. Her granddaughter is present at bedside.   Review of Systems:  Constitutional: Negative for fever, chills, diaphoresis.  HENT: Negative for headache, congestion, nasal discharge.   Eyes: Negative for eye pain, blurred vision, double vision and discharge.  Respiratory: Negative for cough, shortness of breath and wheezing.   Cardiovascular: Negative for chest pain, palpitations, leg swelling.  Gastrointestinal: Negative for heartburn, nausea, vomiting, abdominal pain. Had bowel movement yesterday Genitourinary: Negative for dysuria, flank pain.  Musculoskeletal: Negative for back pain, falls in the facility Skin: Negative for itching, rash.  Neurological: Negative for dizziness, tingling, focal  weakness Psychiatric/Behavioral: Negative for depression.    Past Medical History  Diagnosis Date  . Renal disorder   . Arthritis   . Osteoarthritis   . CHF (congestive heart failure) ([place])   . Dyspnea   . CAD (coronary artery disease)   . COPD (chronic obstructive pulmonary disease) ([place])   . Hypertension   . Hyperlipidemia   . Anxiety   . Allergy   . Osteoporosis   . Proteinuria   . Lymphocytosis   . Dysphagia   . Monocytosis   . Hyponatremia   . Pulmonary hypertension ([place])   . Left ventricular hypertrophy   . Hypertriglyceridemia    Past Surgical History  Procedure Laterality Date  . Tubal ligation    . Tonsillectomy    . Right knee surgery    . Appendectomy     Social History:   reports that she has never smoked. She has never used smokeless tobacco. She reports that she does not drink alcohol or use illicit drugs.  Family History  Problem Relation Age of Onset  . Hypertension Father   . Emphysema Father   . Cancer Sister     "female" then stomach  . COPD Sister   . Stroke Brother   . COPD Daughter   . Heart disease Paternal Grandfather   . COPD Maternal Grandfather     esophageal    Medications:   Medication List       This list is accurate as of: 09/17/15  2:43 PM.  Always use your most recent med list.               acidophilus Caps capsule  Take 1 capsule by mouth daily.  albuterol 108 (90 BASE) MCG/ACT inhaler  Commonly known as:  PROVENTIL HFA;VENTOLIN HFA  Inhale 2 puffs into the lungs every 6 (six) hours as needed for wheezing or shortness of breath.     albuterol (2.5 MG/3ML) 0.083% nebulizer solution  Commonly known as:  PROVENTIL  Take 3 mLs (2.5 mg total) by nebulization every 4 (four) hours as needed for wheezing or shortness of breath. Do not use with your rescue inhaler; just one or the other     ALPRAZolam 0.25 MG tablet  Commonly known as:  XANAX  Take 1 tablet (0.25 mg total) by mouth at bedtime.     amLODipine 10  MG tablet  Commonly known as:  NORVASC  Take 10 mg by mouth daily.     ANORO ELLIPTA 62.5-25 MCG/INH Aepb  Generic drug:  Umeclidinium-Vilanterol  Inhale 1 puff into the lungs daily.     aspirin EC 81 MG tablet  Take 81 mg by mouth daily.     calcium-vitamin D 500-200 MG-UNIT tablet  Commonly known as:  OSCAL WITH D  Take 1 tablet by mouth daily.     chlorpheniramine-HYDROcodone 10-8 MG/5ML Suer  Commonly known as:  TUSSIONEX PENNKINETIC ER  Take 5 mLs by mouth every 12 (twelve) hours as needed for cough.     clopidogrel 75 MG tablet  Commonly known as:  PLAVIX  Take 75 mg by mouth daily.     fexofenadine 180 MG tablet  Commonly known as:  ALLEGRA  Take 180 mg by mouth daily.     meclizine 25 MG tablet  Commonly known as:  ANTIVERT  Take 25 mg by mouth daily as needed for dizziness.     Melatonin 3 MG Tabs  Take 3 mg by mouth at bedtime.     mometasone 50 MCG/ACT nasal spray  Commonly known as:  NASONEX  Place 2 sprays into the nose at bedtime.     multivitamin with minerals tablet  Take 1 tablet by mouth daily.     olmesartan 40 MG tablet  Commonly known as:  BENICAR  Take 40 mg by mouth daily.     Omega-3 Fish Oil 1200 MG Caps  Take 1,200 mg by mouth daily.     oxyCODONE-acetaminophen 5-325 MG tablet  Commonly known as:  PERCOCET/ROXICET  Take 1 tablet by mouth every 4 (four) hours as needed for moderate pain.     polyethylene glycol packet  Commonly known as:  MIRALAX / GLYCOLAX  Take 17 g by mouth daily.     predniSONE 20 MG tablet  Commonly known as:  DELTASONE  Take 1 tablet (20 mg total) by mouth daily with breakfast.     raloxifene 60 MG tablet  Commonly known as:  EVISTA  Take 60 mg by mouth daily.     simvastatin 40 MG tablet  Commonly known as:  ZOCOR  Take 40 mg by mouth at bedtime.     sotalol 80 MG tablet  Commonly known as:  BETAPACE  Take 1 tablet (80 mg total) by mouth every 12 (twelve) hours.     vitamin C 500 MG tablet    Commonly known as:  ASCORBIC ACID  Take 500 mg by mouth daily.     Vitamin D3 2000 UNITS Tabs  Take 2,000 Units by mouth daily.         Physical Exam: Filed Vitals:   09/17/15 1441  BP: 147/75  Pulse: 89  Temp: 98.4 F (36.9 C)  Resp: 18  SpO2: 93%    General- elderly female, well built, in no acute distress Head- normocephalic, atraumatic Nose- normal nasal mucosa, no maxillary or frontal sinus tenderness, no nasal discharge Throat- moist mucus membrane, has upper and lower dentures Eyes- PERRLA, EOMI, no pallor, no icterus, no discharge, normal conjunctiva, normal sclera Neck- no cervical lymphadenopathy, no thyromegaly, no jugular vein distension Cardiovascular- normal s1,s2, no murmurs, no leg edema Respiratory- bilateral clear to auscultation, no wheeze, no rhonchi, no crackles, no use of accessory muscles Abdomen- bowel sounds present, soft, non tender Musculoskeletal- able to move all 4 extremities, generalized weakness, unsteady gait  Neurological- no focal deficit, alert and oriented to person, place and time Skin- warm and dry, extensive bruising to left arm Psychiatry- normal mood and affect    Labs reviewed: Basic Metabolic Panel:  Recent Labs  09/11/15 1346 09/11/15 1903 09/12/15 0524 09/13/15 0536  NA 139  --  137 133*  K 3.2*  --  5.0 4.0  CL 108  --  107 102  CO2 25  --  23 27  GLUCOSE 118*  --  173* 128*  BUN 34*  --  26* 26*  CREATININE 0.84  --  0.72 0.75  CALCIUM 8.5*  --  8.4* 8.3*  MG  --  1.7  --   --     CBC:  Recent Labs  05/14/15 1134  09/12/15 0647 09/13/15 0536 09/15/15 09/15/15 0602  WBC 10.4  < > 17.2* 17.9* 22.5 22.5*  NEUTROABS 5.9  --   --   --   --   --   HGB  --   < > 10.9* 10.9*  --  11.1*  HCT 33.0*  < > 33.1* 32.6*  --  32.9*  MCV  --   < > 90.1 90.8  --  90.3  PLT  --   < > 276 253  --  279  < > = values in this interval not displayed. Cardiac Enzymes:  Recent Labs  04/07/15 1937 05/18/15 2216  09/11/15 1346  TROPONINI <0.03 <0.03 <0.03    Assessment/Plan  Unsteady gait Post pelvic fracture. Will have her work with physical therapy and occupational therapy team to help with gait training and muscle strengthening exercises.fall precautions. Skin care. Encourage to be out of bed.   Pelvic fracture Pain currently not controlled with movement. Currently on percocet 5-325 mg q4h prn pain. Start tylenol extra strength 1000 mg bid for now and assess further. Get physiatry consult. Continue calcium and vitamin d supplement.  Acute CVA To work with PT and OT to help restore strength and balance. Continue plavix 75 mg daily and zocor 40 mg daily. Will discontinue aspirin as per neurology recommendation.   Constipation Continue miralax 17 g daily, hydration to be maintained  Hyponatremia Check bmp, aao x 3 at present  Leukocytosis With her on prednisone, this could be contributing to it. Currently afebrile and no signs of infection  COPD Breathing stable, continue o2 by nasal canula. Continue prn proventil and anoro ellipta. Was supposed to be on tapering course of prednisone. Patient has not received it in the facility. Start prednsione 20 mg po daily x 3 days, then 10 mg po daily x 3 days and stop it  Acute sinusitis Has completed antibiotic course need to complete prednisone taper  HTN Elevated BP today. As per staff had drop in her BP to 78/65 yesterday. i don't think this was an accurate reading. Will need to get manual BP reading  if has such low bp reading again. Continue amlodipine 10 mg daily and olmesartan 40 mg daily.   Dysphagia Will have her work with SLP team, aspiration precautions  Palpitations with history of SVT Stable, continue sotalol 80 mg bid  Insomnia On melatonin 3 mg qhs and xanax 0.25 mg qhs.   OSA Continue CPAP at bedtime  Anemia of chronic disease Monitor cbc   Goals of care: short term rehabilitation   Labs/tests ordered: cbc with diff,  cmp  Family/ staff Communication: reviewed care plan with patient and nursing supervisor    Blanchie Serve, MD  [facility] [phone] (Monday-Friday 8 am - 5 pm) [phone] (afterhours)

## 2015-09-18 DIAGNOSIS — S329XXA Fracture of unspecified parts of lumbosacral spine and pelvis, initial encounter for closed fracture: Secondary | ICD-10-CM | POA: Diagnosis not present

## 2015-09-18 DIAGNOSIS — M25552 Pain in left hip: Secondary | ICD-10-CM | POA: Diagnosis not present

## 2015-09-18 DIAGNOSIS — R5381 Other malaise: Secondary | ICD-10-CM | POA: Diagnosis not present

## 2015-09-18 DIAGNOSIS — J449 Chronic obstructive pulmonary disease, unspecified: Secondary | ICD-10-CM | POA: Diagnosis not present

## 2015-09-18 DIAGNOSIS — R262 Difficulty in walking, not elsewhere classified: Secondary | ICD-10-CM | POA: Diagnosis not present

## 2015-09-18 DIAGNOSIS — M6281 Muscle weakness (generalized): Secondary | ICD-10-CM | POA: Diagnosis not present

## 2015-09-19 DIAGNOSIS — J449 Chronic obstructive pulmonary disease, unspecified: Secondary | ICD-10-CM | POA: Diagnosis not present

## 2015-09-19 DIAGNOSIS — M25552 Pain in left hip: Secondary | ICD-10-CM | POA: Diagnosis not present

## 2015-09-19 DIAGNOSIS — S329XXA Fracture of unspecified parts of lumbosacral spine and pelvis, initial encounter for closed fracture: Secondary | ICD-10-CM | POA: Diagnosis not present

## 2015-09-19 DIAGNOSIS — M6281 Muscle weakness (generalized): Secondary | ICD-10-CM | POA: Diagnosis not present

## 2015-09-19 DIAGNOSIS — R5381 Other malaise: Secondary | ICD-10-CM | POA: Diagnosis not present

## 2015-09-19 DIAGNOSIS — R262 Difficulty in walking, not elsewhere classified: Secondary | ICD-10-CM | POA: Diagnosis not present

## 2015-09-22 ENCOUNTER — Non-Acute Institutional Stay (SKILLED_NURSING_FACILITY): Payer: Medicare Other | Admitting: Nurse Practitioner

## 2015-09-22 DIAGNOSIS — J441 Chronic obstructive pulmonary disease with (acute) exacerbation: Secondary | ICD-10-CM | POA: Diagnosis not present

## 2015-09-22 DIAGNOSIS — E871 Hypo-osmolality and hyponatremia: Secondary | ICD-10-CM | POA: Diagnosis not present

## 2015-09-22 DIAGNOSIS — S329XXA Fracture of unspecified parts of lumbosacral spine and pelvis, initial encounter for closed fracture: Secondary | ICD-10-CM

## 2015-09-22 DIAGNOSIS — R262 Difficulty in walking, not elsewhere classified: Secondary | ICD-10-CM | POA: Diagnosis not present

## 2015-09-22 DIAGNOSIS — I10 Essential (primary) hypertension: Secondary | ICD-10-CM

## 2015-09-22 DIAGNOSIS — I639 Cerebral infarction, unspecified: Secondary | ICD-10-CM | POA: Diagnosis not present

## 2015-09-22 DIAGNOSIS — K5900 Constipation, unspecified: Secondary | ICD-10-CM

## 2015-09-22 DIAGNOSIS — F419 Anxiety disorder, unspecified: Secondary | ICD-10-CM | POA: Diagnosis not present

## 2015-09-22 DIAGNOSIS — B372 Candidiasis of skin and nail: Secondary | ICD-10-CM | POA: Diagnosis not present

## 2015-09-22 DIAGNOSIS — E46 Unspecified protein-calorie malnutrition: Secondary | ICD-10-CM

## 2015-09-22 DIAGNOSIS — M25552 Pain in left hip: Secondary | ICD-10-CM | POA: Diagnosis not present

## 2015-09-22 DIAGNOSIS — J449 Chronic obstructive pulmonary disease, unspecified: Secondary | ICD-10-CM | POA: Diagnosis not present

## 2015-09-22 DIAGNOSIS — M6281 Muscle weakness (generalized): Secondary | ICD-10-CM | POA: Diagnosis not present

## 2015-09-22 DIAGNOSIS — R5381 Other malaise: Secondary | ICD-10-CM | POA: Diagnosis not present

## 2015-09-23 ENCOUNTER — Encounter: Payer: Self-pay | Admitting: Nurse Practitioner

## 2015-09-23 DIAGNOSIS — S329XXA Fracture of unspecified parts of lumbosacral spine and pelvis, initial encounter for closed fracture: Secondary | ICD-10-CM | POA: Diagnosis not present

## 2015-09-23 DIAGNOSIS — J449 Chronic obstructive pulmonary disease, unspecified: Secondary | ICD-10-CM | POA: Diagnosis not present

## 2015-09-23 DIAGNOSIS — M25552 Pain in left hip: Secondary | ICD-10-CM | POA: Diagnosis not present

## 2015-09-23 DIAGNOSIS — R262 Difficulty in walking, not elsewhere classified: Secondary | ICD-10-CM | POA: Diagnosis not present

## 2015-09-23 DIAGNOSIS — M6281 Muscle weakness (generalized): Secondary | ICD-10-CM | POA: Diagnosis not present

## 2015-09-23 DIAGNOSIS — B372 Candidiasis of skin and nail: Secondary | ICD-10-CM | POA: Insufficient documentation

## 2015-09-23 DIAGNOSIS — E46 Unspecified protein-calorie malnutrition: Secondary | ICD-10-CM | POA: Insufficient documentation

## 2015-09-23 DIAGNOSIS — K5900 Constipation, unspecified: Secondary | ICD-10-CM | POA: Insufficient documentation

## 2015-09-23 DIAGNOSIS — R5381 Other malaise: Secondary | ICD-10-CM | POA: Diagnosis not present

## 2015-09-23 NOTE — Progress Notes (Signed)
Patient ID: Marisa Hicks, female   DOB: 04/25/1922, 81 y.o.   MRN: [id number]  Location:  SNF [place] Provider:  Marlana Latus NP  Code Status:  DNR Goals of care: Advanced Directive information    Chief Complaint  Patient presents with  . Medical Management of Chronic Issues  . Acute Visit    cough, central congestion, SOB, rash between thighs, left pelvis fx     HPI: Patient is a 81 y.o. female seen in the SNF at [facility] today for evaluation of cough, central congestion, SOB, O2 dependent, Hx of COPD, recent left obturator ring fx, pain is managed with Percocet bid, she ambulates with therapy. Also noted rash between her thighs, Nystatin powder to the areas x 3 weeks since 09/19/15. Her last wbc 18.1, Hgb 10.8, Na 134, Bun 29, creat 0.94, albumin 2.14 09/17/15.   Review of Systems:  Review of Systems  Constitutional: Positive for malaise/fatigue. Negative for fever and chills.  HENT: Negative for sore throat.   Eyes: Negative for blurred vision.  Respiratory: Positive for cough and shortness of breath. Negative for hemoptysis and wheezing.   Cardiovascular: Positive for leg swelling. Negative for chest pain and palpitations.       Trace edema in ankles.   Gastrointestinal: Negative for nausea, vomiting, abdominal pain, diarrhea and blood in stool.  Genitourinary: Negative for dysuria, urgency and frequency.  Musculoskeletal: Positive for joint pain and falls. Negative for back pain.  Neurological: Positive for tingling and focal weakness. Negative for dizziness, tremors, sensory change and headaches.  Endo/Heme/Allergies: Does not bruise/bleed easily.  Psychiatric/Behavioral: The patient is nervous/anxious.     Past Medical History  Diagnosis Date  . Renal disorder   . Arthritis   . Osteoarthritis   . CHF (congestive heart failure) ([place])   . Dyspnea   . CAD (coronary artery disease)   . COPD (chronic obstructive pulmonary disease) ([place])   . Hypertension   .  Hyperlipidemia   . Anxiety   . Allergy   . Osteoporosis   . Proteinuria   . Lymphocytosis   . Dysphagia   . Monocytosis   . Hyponatremia   . Pulmonary hypertension ([place])   . Left ventricular hypertrophy   . Hypertriglyceridemia     Patient Active Problem List   Diagnosis Date Noted  . Candidiasis of skin 09/23/2015  . Protein-calorie malnutrition ([place]) 09/23/2015  . Constipation 09/23/2015  . Stroke ([place]) 09/15/2015  . Atherosclerosis of left carotid artery 09/13/2015  . Atherosclerosis of right carotid artery 09/13/2015  . Embolic stroke of right basal ganglia ([place]) 09/13/2015  . Pelvic fracture, closed, initial encounter 09/11/2015  . Acute on chronic respiratory failure ([place]) 05/19/2015  . COPD exacerbation ([place]) 05/16/2015  . Bronchitis, acute, with bronchospasm 05/16/2015  . Cough 05/14/2015  . Dyspnea   . CAD (coronary artery disease)   . COPD (chronic obstructive pulmonary disease) ([place])   . Hypertension   . Hyperlipidemia   . Anxiety   . Allergy   . Osteoporosis   . Proteinuria   . Lymphocytosis   . Dysphagia   . Monocytosis   . Hyponatremia   . Pulmonary hypertension ([place])   . Left ventricular hypertrophy   . Hypertriglyceridemia     Allergies  Allergen Reactions  . Ace Inhibitors   . Aliskiren-Hydrochlorothiazide Other (See Comments)  . Altace [Ramipril] Other (See Comments)    angioedema  . Atorvastatin Other (See Comments)  . Hydralazine Hcl   . Hydrochlorothiazide Other (  See Comments)    hyponatremia  . Irbesartan-Hydrochlorothiazide Other (See Comments)  . Labetalol Hcl   . Rosuvastatin Other (See Comments)  . Tribenzor [Olmesartan-Amlodipine-Hctz] Other (See Comments)    Low sodium    Medications: Patient's Medications  New Prescriptions   No medications on file  Previous Medications   ACIDOPHILUS (RISAQUAD) CAPS CAPSULE    Take 1 capsule by mouth daily.   ALBUTEROL (PROVENTIL HFA;VENTOLIN HFA) 108 (90 BASE) MCG/ACT INHALER    Inhale  2 puffs into the lungs every 6 (six) hours as needed for wheezing or shortness of breath.   ALBUTEROL (PROVENTIL) (2.5 MG/3ML) 0.083% NEBULIZER SOLUTION    Take 3 mLs (2.5 mg total) by nebulization every 4 (four) hours as needed for wheezing or shortness of breath. Do not use with your rescue inhaler; just one or the other   ALPRAZOLAM (XANAX) 0.25 MG TABLET    Take 1 tablet (0.25 mg total) by mouth at bedtime.   AMLODIPINE (NORVASC) 10 MG TABLET    Take 10 mg by mouth daily.   ASPIRIN EC 81 MG TABLET    Take 81 mg by mouth daily.   CALCIUM-VITAMIN D (OSCAL WITH D) 500-200 MG-UNIT PER TABLET    Take 1 tablet by mouth daily.    CHLORPHENIRAMINE-HYDROCODONE (TUSSIONEX PENNKINETIC ER) 10-8 MG/5ML SUER    Take 5 mLs by mouth every 12 (twelve) hours as needed for cough.   CHOLECALCIFEROL (VITAMIN D3) 2000 UNITS TABS    Take 2,000 Units by mouth daily.   CLOPIDOGREL (PLAVIX) 75 MG TABLET    Take 75 mg by mouth daily.   FEXOFENADINE (ALLEGRA) 180 MG TABLET    Take 180 mg by mouth daily.   MECLIZINE (ANTIVERT) 25 MG TABLET    Take 25 mg by mouth daily as needed for dizziness.   MELATONIN 3 MG TABS    Take 3 mg by mouth at bedtime.   MOMETASONE (NASONEX) 50 MCG/ACT NASAL SPRAY    Place 2 sprays into the nose at bedtime.    MULTIPLE VITAMINS-MINERALS (MULTIVITAMIN WITH MINERALS) TABLET    Take 1 tablet by mouth daily.   OLMESARTAN (BENICAR) 40 MG TABLET    Take 40 mg by mouth daily.   OMEGA-3 FATTY ACIDS (OMEGA-3 FISH OIL) 1200 MG CAPS    Take 1,200 mg by mouth daily.   OXYCODONE-ACETAMINOPHEN (PERCOCET/ROXICET) 5-325 MG TABLET    Take 1 tablet by mouth every 4 (four) hours as needed for moderate pain.   POLYETHYLENE GLYCOL (MIRALAX / GLYCOLAX) PACKET    Take 17 g by mouth daily.   PREDNISONE (DELTASONE) 20 MG TABLET    Take 1 tablet (20 mg total) by mouth daily with breakfast.   RALOXIFENE (EVISTA) 60 MG TABLET    Take 60 mg by mouth daily.   SIMVASTATIN (ZOCOR) 40 MG TABLET    Take 40 mg by mouth at  bedtime.    SOTALOL (BETAPACE) 80 MG TABLET    Take 1 tablet (80 mg total) by mouth every 12 (twelve) hours.   UMECLIDINIUM-VILANTEROL (ANORO ELLIPTA) 62.5-25 MCG/INH AEPB    Inhale 1 puff into the lungs daily.   VITAMIN C (ASCORBIC ACID) 500 MG TABLET    Take 500 mg by mouth daily.  Modified Medications   No medications on file  Discontinued Medications   No medications on file    Physical Exam: Filed Vitals:   09/22/15 1548  BP: 151/75  Pulse: 91  Temp: 98.3 F (36.8 C)  TempSrc: Tympanic  Resp: 18   There is no weight on file to calculate BMI.  Physical Exam  Constitutional: She appears well-developed and well-nourished. No distress.  HENT:  Head: Normocephalic and atraumatic.  Eyes: EOM are normal. No scleral icterus.  Neck: No thyromegaly present.  Cardiovascular: Normal rate, regular rhythm and normal heart sounds.   No murmur heard. Pulmonary/Chest: Effort normal. No respiratory distress. She has no wheezes. She has rales.  Central congestion, coarse rhonchi, bibasilar rales.   Abdominal: Soft. Bowel sounds are normal. She exhibits no distension.  Musculoskeletal: Normal range of motion. She exhibits edema.  Trace edema ankles.   Neurological: She is alert. She exhibits normal muscle tone.  Skin: Skin is warm and dry. She is not diaphoretic. No pallor.  Psychiatric: She has a normal mood and affect. Her behavior is normal. Judgment and thought content normal.    Labs reviewed: Basic Metabolic Panel:  Recent Labs  09/11/15 1346 09/11/15 1903 09/12/15 0524 09/13/15 0536  NA 139  --  137 133*  K 3.2*  --  5.0 4.0  CL 108  --  107 102  CO2 25  --  23 27  GLUCOSE 118*  --  173* 128*  BUN 34*  --  26* 26*  CREATININE 0.84  --  0.72 0.75  CALCIUM 8.5*  --  8.4* 8.3*  MG  --  1.7  --   --     Liver Function Tests: No results for input(s): AST, ALT, ALKPHOS, BILITOT, PROT, ALBUMIN in the last 8760 hours.  CBC:  Recent Labs  05/14/15 1134   09/12/15 0647 09/13/15 0536 09/15/15 09/15/15 0602  WBC 10.4  < > 17.2* 17.9* 22.5 22.5*  NEUTROABS 5.9  --   --   --   --   --   HGB  --   < > 10.9* 10.9*  --  11.1*  HCT 33.0*  < > 33.1* 32.6*  --  32.9*  MCV  --   < > 90.1 90.8  --  90.3  PLT  --   < > 276 253  --  279  < > = values in this interval not displayed.  Lab Results  Component Value Date   TSH 1.090 05/18/2015   Lab Results  Component Value Date   HGBA1C 6.1* 09/13/2015   Lab Results  Component Value Date   CHOL 169 09/13/2015   HDL 60 09/13/2015   LDLCALC 63 09/13/2015   TRIG 228* 09/13/2015   CHOLHDL 2.8 09/13/2015    Significant Diagnostic Results since last visit: none  Patient Care Team: Arnetha Courser, MD as PCP - General (Family Medicine) Algernon Huxley, MD as Referring Physician (Vascular Surgery)  Assessment/Plan Problem List Items Addressed This Visit    Anxiety    Mood is stable, continue Alprazolam 0.25mg  qhs.      Candidiasis of skin    Noted skin rash between thighs, continue Nystatin powder bid to affected areas for 3 weeks since 09/19/15      Constipation    Stable, continue MiraLax daily.       COPD exacerbation ([place]) - Primary    O2 dependent, obtain CXR, 09/17/15 wbc 18.1, will start Amoxicillin 500mg  q6h po x 10 days(the patient and her dtr choice of ABT), Mucinex 600mg  bid po, Medrol dose pk, DuoNeb q6h. Continue Proventil 2 puffs q6h prn, Robitussin q6h prn, Anoro Ellipta daily.       Hypertension    Controlled, continue Sotalol 80mg  bid,  Benicar 40mg  daily, Amlodipine 10mg  qd,       Hyponatremia    Chronic, mild, last Na 134 09/17/15.       Pelvic fracture, closed, initial encounter    The left obturator ring fx, continue Therapy for gait, strengthening, balance, therapeutic exercise. Continue pain control with Percocet bid.       Protein-calorie malnutrition ([place])    09/17/15 total protein 4.0, albumin 2.14, may consider dietary consult       Stroke ([place])    No  new focal neurological symptoms, continue Plavix, Omega 3, Simvastatin for risk reduction.           Family/ staff Communication: observe for s/s exacerbation of COPD  Labs/tests ordered:  CXR  [facility] Orie Cuttino NP Geriatric[facility] [address], [place] [address] On Call:  [phone] & follow prompts after 5pm & weekends Office Phone:  [phone] Office Fax:  [phone]

## 2015-09-23 NOTE — Assessment & Plan Note (Addendum)
O2 dependent, obtain CXR, 09/17/15 wbc 18.1, will start Amoxicillin 500mg  q6h po x 10 days(the patient and her dtr choice of ABT), Mucinex 600mg  bid po, Medrol dose pk, DuoNeb q6h. Continue Proventil 2 puffs q6h prn, Robitussin q6h prn, Anoro Ellipta daily.

## 2015-09-23 NOTE — Assessment & Plan Note (Signed)
The left obturator ring fx, continue Therapy for gait, strengthening, balance, therapeutic exercise. Continue pain control with Percocet bid.

## 2015-09-23 NOTE — Assessment & Plan Note (Signed)
Chronic, mild, last Na 134 09/17/15.

## 2015-09-23 NOTE — Assessment & Plan Note (Signed)
09/17/15 total protein 4.0, albumin 2.14, may consider dietary consult

## 2015-09-23 NOTE — Assessment & Plan Note (Signed)
Stable, continue MiraLax daily.  

## 2015-09-23 NOTE — Assessment & Plan Note (Signed)
Mood is stable, continue Alprazolam 0.25mg  qhs.

## 2015-09-23 NOTE — Assessment & Plan Note (Signed)
No new focal neurological symptoms, continue Plavix, Omega 3, Simvastatin for risk reduction.

## 2015-09-23 NOTE — Assessment & Plan Note (Signed)
Noted skin rash between thighs, continue Nystatin powder bid to affected areas for 3 weeks since 09/19/15

## 2015-09-23 NOTE — Assessment & Plan Note (Signed)
Controlled, continue Sotalol 80mg  bid, Benicar 40mg  daily, Amlodipine 10mg  qd,

## 2015-09-24 DIAGNOSIS — S329XXA Fracture of unspecified parts of lumbosacral spine and pelvis, initial encounter for closed fracture: Secondary | ICD-10-CM | POA: Diagnosis not present

## 2015-09-24 DIAGNOSIS — M25552 Pain in left hip: Secondary | ICD-10-CM | POA: Diagnosis not present

## 2015-09-24 DIAGNOSIS — R5381 Other malaise: Secondary | ICD-10-CM | POA: Diagnosis not present

## 2015-09-24 DIAGNOSIS — R262 Difficulty in walking, not elsewhere classified: Secondary | ICD-10-CM | POA: Diagnosis not present

## 2015-09-24 DIAGNOSIS — M6281 Muscle weakness (generalized): Secondary | ICD-10-CM | POA: Diagnosis not present

## 2015-09-24 DIAGNOSIS — J449 Chronic obstructive pulmonary disease, unspecified: Secondary | ICD-10-CM | POA: Diagnosis not present

## 2015-09-29 DIAGNOSIS — M25552 Pain in left hip: Secondary | ICD-10-CM | POA: Diagnosis not present

## 2015-09-29 DIAGNOSIS — S329XXA Fracture of unspecified parts of lumbosacral spine and pelvis, initial encounter for closed fracture: Secondary | ICD-10-CM | POA: Diagnosis not present

## 2015-09-29 DIAGNOSIS — R5381 Other malaise: Secondary | ICD-10-CM | POA: Diagnosis not present

## 2015-09-29 DIAGNOSIS — J441 Chronic obstructive pulmonary disease with (acute) exacerbation: Secondary | ICD-10-CM | POA: Diagnosis not present

## 2015-09-29 DIAGNOSIS — R262 Difficulty in walking, not elsewhere classified: Secondary | ICD-10-CM | POA: Diagnosis not present

## 2015-09-29 DIAGNOSIS — M6281 Muscle weakness (generalized): Secondary | ICD-10-CM | POA: Diagnosis not present

## 2015-10-01 DIAGNOSIS — J449 Chronic obstructive pulmonary disease, unspecified: Secondary | ICD-10-CM | POA: Diagnosis not present

## 2015-10-01 DIAGNOSIS — R262 Difficulty in walking, not elsewhere classified: Secondary | ICD-10-CM | POA: Diagnosis not present

## 2015-10-01 DIAGNOSIS — S329XXA Fracture of unspecified parts of lumbosacral spine and pelvis, initial encounter for closed fracture: Secondary | ICD-10-CM | POA: Diagnosis not present

## 2015-10-01 DIAGNOSIS — M6281 Muscle weakness (generalized): Secondary | ICD-10-CM | POA: Diagnosis not present

## 2015-10-01 DIAGNOSIS — R5381 Other malaise: Secondary | ICD-10-CM | POA: Diagnosis not present

## 2015-10-01 DIAGNOSIS — M25552 Pain in left hip: Secondary | ICD-10-CM | POA: Diagnosis not present

## 2015-10-03 DIAGNOSIS — R262 Difficulty in walking, not elsewhere classified: Secondary | ICD-10-CM | POA: Diagnosis not present

## 2015-10-03 DIAGNOSIS — M25552 Pain in left hip: Secondary | ICD-10-CM | POA: Diagnosis not present

## 2015-10-03 DIAGNOSIS — J449 Chronic obstructive pulmonary disease, unspecified: Secondary | ICD-10-CM | POA: Diagnosis not present

## 2015-10-03 DIAGNOSIS — M6281 Muscle weakness (generalized): Secondary | ICD-10-CM | POA: Diagnosis not present

## 2015-10-03 DIAGNOSIS — S329XXA Fracture of unspecified parts of lumbosacral spine and pelvis, initial encounter for closed fracture: Secondary | ICD-10-CM | POA: Diagnosis not present

## 2015-10-03 DIAGNOSIS — R5381 Other malaise: Secondary | ICD-10-CM | POA: Diagnosis not present

## 2015-10-07 DIAGNOSIS — S329XXA Fracture of unspecified parts of lumbosacral spine and pelvis, initial encounter for closed fracture: Secondary | ICD-10-CM | POA: Diagnosis not present

## 2015-10-07 DIAGNOSIS — R5381 Other malaise: Secondary | ICD-10-CM | POA: Diagnosis not present

## 2015-10-07 DIAGNOSIS — M25552 Pain in left hip: Secondary | ICD-10-CM | POA: Diagnosis not present

## 2015-10-07 DIAGNOSIS — R262 Difficulty in walking, not elsewhere classified: Secondary | ICD-10-CM | POA: Diagnosis not present

## 2015-10-07 DIAGNOSIS — J449 Chronic obstructive pulmonary disease, unspecified: Secondary | ICD-10-CM | POA: Diagnosis not present

## 2015-10-07 DIAGNOSIS — M6281 Muscle weakness (generalized): Secondary | ICD-10-CM | POA: Diagnosis not present

## 2015-10-17 DIAGNOSIS — M6281 Muscle weakness (generalized): Secondary | ICD-10-CM | POA: Diagnosis not present

## 2015-10-17 DIAGNOSIS — J449 Chronic obstructive pulmonary disease, unspecified: Secondary | ICD-10-CM | POA: Diagnosis not present

## 2015-10-17 DIAGNOSIS — M25552 Pain in left hip: Secondary | ICD-10-CM | POA: Diagnosis not present

## 2015-10-17 DIAGNOSIS — R5381 Other malaise: Secondary | ICD-10-CM | POA: Diagnosis not present

## 2015-10-17 DIAGNOSIS — S329XXA Fracture of unspecified parts of lumbosacral spine and pelvis, initial encounter for closed fracture: Secondary | ICD-10-CM | POA: Diagnosis not present

## 2015-10-17 DIAGNOSIS — R262 Difficulty in walking, not elsewhere classified: Secondary | ICD-10-CM | POA: Diagnosis not present

## 2015-10-20 DIAGNOSIS — M6281 Muscle weakness (generalized): Secondary | ICD-10-CM | POA: Diagnosis not present

## 2015-10-20 DIAGNOSIS — M25552 Pain in left hip: Secondary | ICD-10-CM | POA: Diagnosis not present

## 2015-10-20 DIAGNOSIS — S329XXA Fracture of unspecified parts of lumbosacral spine and pelvis, initial encounter for closed fracture: Secondary | ICD-10-CM | POA: Diagnosis not present

## 2015-10-20 DIAGNOSIS — R5381 Other malaise: Secondary | ICD-10-CM | POA: Diagnosis not present

## 2015-10-20 DIAGNOSIS — R262 Difficulty in walking, not elsewhere classified: Secondary | ICD-10-CM | POA: Diagnosis not present

## 2015-10-20 DIAGNOSIS — J449 Chronic obstructive pulmonary disease, unspecified: Secondary | ICD-10-CM | POA: Diagnosis not present

## 2015-10-21 DIAGNOSIS — M6281 Muscle weakness (generalized): Secondary | ICD-10-CM | POA: Diagnosis not present

## 2015-10-21 DIAGNOSIS — S329XXA Fracture of unspecified parts of lumbosacral spine and pelvis, initial encounter for closed fracture: Secondary | ICD-10-CM | POA: Diagnosis not present

## 2015-10-21 DIAGNOSIS — J449 Chronic obstructive pulmonary disease, unspecified: Secondary | ICD-10-CM | POA: Diagnosis not present

## 2015-10-21 DIAGNOSIS — M25552 Pain in left hip: Secondary | ICD-10-CM | POA: Diagnosis not present

## 2015-10-21 DIAGNOSIS — R5381 Other malaise: Secondary | ICD-10-CM | POA: Diagnosis not present

## 2015-10-21 DIAGNOSIS — R262 Difficulty in walking, not elsewhere classified: Secondary | ICD-10-CM | POA: Diagnosis not present

## 2015-10-28 DIAGNOSIS — M6281 Muscle weakness (generalized): Secondary | ICD-10-CM | POA: Diagnosis not present

## 2015-10-28 DIAGNOSIS — S329XXA Fracture of unspecified parts of lumbosacral spine and pelvis, initial encounter for closed fracture: Secondary | ICD-10-CM | POA: Diagnosis not present

## 2015-10-28 DIAGNOSIS — R5381 Other malaise: Secondary | ICD-10-CM | POA: Diagnosis not present

## 2015-10-28 DIAGNOSIS — R262 Difficulty in walking, not elsewhere classified: Secondary | ICD-10-CM | POA: Diagnosis not present

## 2015-10-28 DIAGNOSIS — M25552 Pain in left hip: Secondary | ICD-10-CM | POA: Diagnosis not present

## 2015-10-28 DIAGNOSIS — J449 Chronic obstructive pulmonary disease, unspecified: Secondary | ICD-10-CM | POA: Diagnosis not present

## 2015-10-29 DIAGNOSIS — S329XXA Fracture of unspecified parts of lumbosacral spine and pelvis, initial encounter for closed fracture: Secondary | ICD-10-CM | POA: Diagnosis not present

## 2015-10-29 DIAGNOSIS — R262 Difficulty in walking, not elsewhere classified: Secondary | ICD-10-CM | POA: Diagnosis not present

## 2015-10-29 DIAGNOSIS — J449 Chronic obstructive pulmonary disease, unspecified: Secondary | ICD-10-CM | POA: Diagnosis not present

## 2015-10-29 DIAGNOSIS — R5381 Other malaise: Secondary | ICD-10-CM | POA: Diagnosis not present

## 2015-10-29 DIAGNOSIS — M25552 Pain in left hip: Secondary | ICD-10-CM | POA: Diagnosis not present

## 2015-10-29 DIAGNOSIS — M6281 Muscle weakness (generalized): Secondary | ICD-10-CM | POA: Diagnosis not present

## 2015-10-30 ENCOUNTER — Encounter: Payer: Self-pay | Admitting: Adult Health

## 2015-10-30 DIAGNOSIS — M25552 Pain in left hip: Secondary | ICD-10-CM | POA: Diagnosis not present

## 2015-10-30 DIAGNOSIS — R5381 Other malaise: Secondary | ICD-10-CM | POA: Diagnosis not present

## 2015-10-30 DIAGNOSIS — S329XXA Fracture of unspecified parts of lumbosacral spine and pelvis, initial encounter for closed fracture: Secondary | ICD-10-CM | POA: Diagnosis not present

## 2015-10-30 DIAGNOSIS — M6281 Muscle weakness (generalized): Secondary | ICD-10-CM | POA: Diagnosis not present

## 2015-10-30 DIAGNOSIS — R262 Difficulty in walking, not elsewhere classified: Secondary | ICD-10-CM | POA: Diagnosis not present

## 2015-10-30 DIAGNOSIS — J449 Chronic obstructive pulmonary disease, unspecified: Secondary | ICD-10-CM | POA: Diagnosis not present

## 2015-10-30 NOTE — Progress Notes (Signed)
Patient ID: Marisa Hicks, female   DOB: [birth year], 82 y.o.   MRN: [id number]   This encounter was created in error - please disregard.

## 2015-11-06 ENCOUNTER — Ambulatory Visit: Payer: Medicare Other | Admitting: Family Medicine

## 2015-11-11 ENCOUNTER — Encounter: Payer: Self-pay | Admitting: Family Medicine

## 2015-12-07 DIAGNOSIS — S7001XA Contusion of right hip, initial encounter: Secondary | ICD-10-CM | POA: Diagnosis not present

## 2015-12-19 DIAGNOSIS — R404 Transient alteration of awareness: Secondary | ICD-10-CM | POA: Diagnosis not present

## 2015-12-19 DIAGNOSIS — R531 Weakness: Secondary | ICD-10-CM | POA: Diagnosis not present

## 2016-01-10 DIAGNOSIS — R05 Cough: Secondary | ICD-10-CM | POA: Diagnosis not present

## 2016-02-05 IMAGING — CR DG CHEST 1V
1 series · 1 of 1 positions shown · non-contrast
Comparison: 05/18/2015

CLINICAL DATA: Wheezing.

EXAM:
CHEST  1 VIEW

[portable]
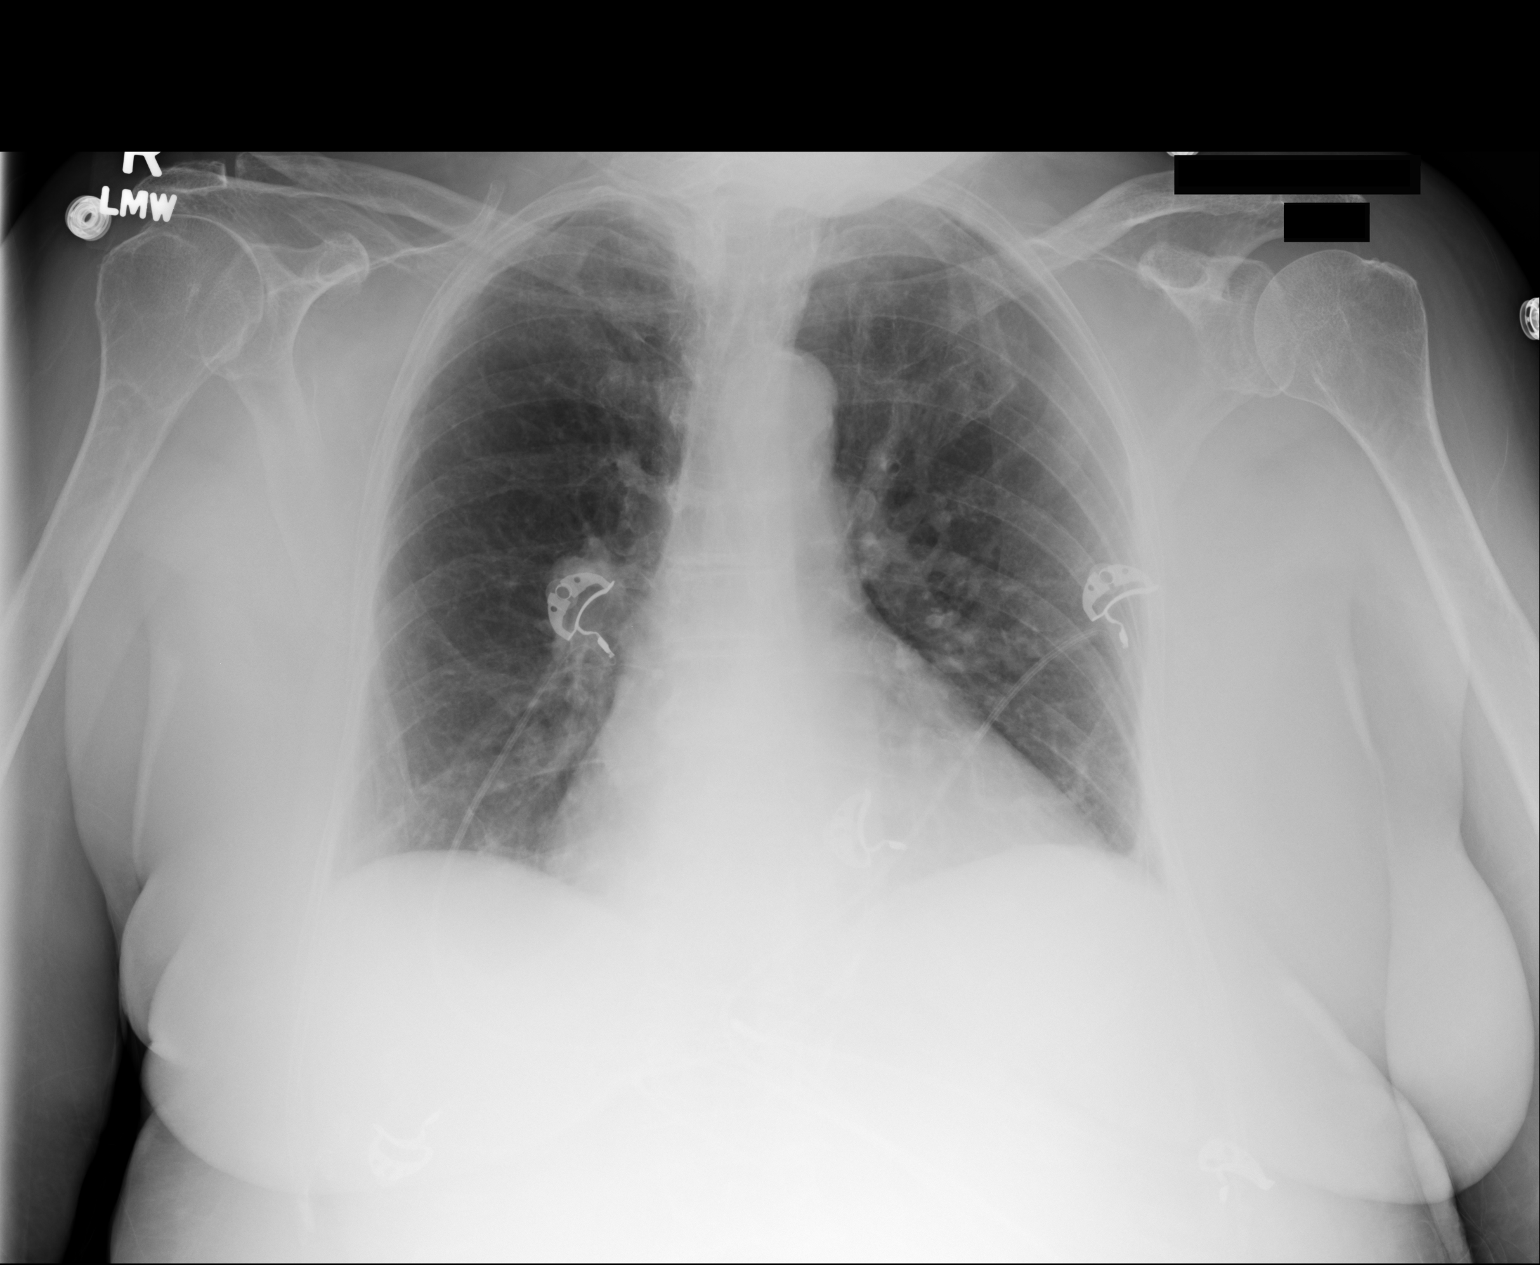

[1 of 1 positions shown; findings below may reference images not displayed]

FINDINGS: Chronic interstitial coarsening and hyperinflation. Stable
appearance of mildly prominent hilar vessels. Chronic cardiomegaly.
There is no edema, consolidation, effusion, or pneumothorax.
IMPRESSION: Stable exam.  No evidence of acute cardiopulmonary disease.

COPD.

## 2016-05-27 IMAGING — CR DG HIP (WITH OR WITHOUT PELVIS) 2-3V*L*
3 series · 3 of 3 positions shown · non-contrast
Comparison: None.

CLINICAL DATA: Fall with left hip pain.  Initial encounter.

EXAM:
DG HIP (WITH OR WITHOUT PELVIS) 2-3V LEFT

[pelvis ap]
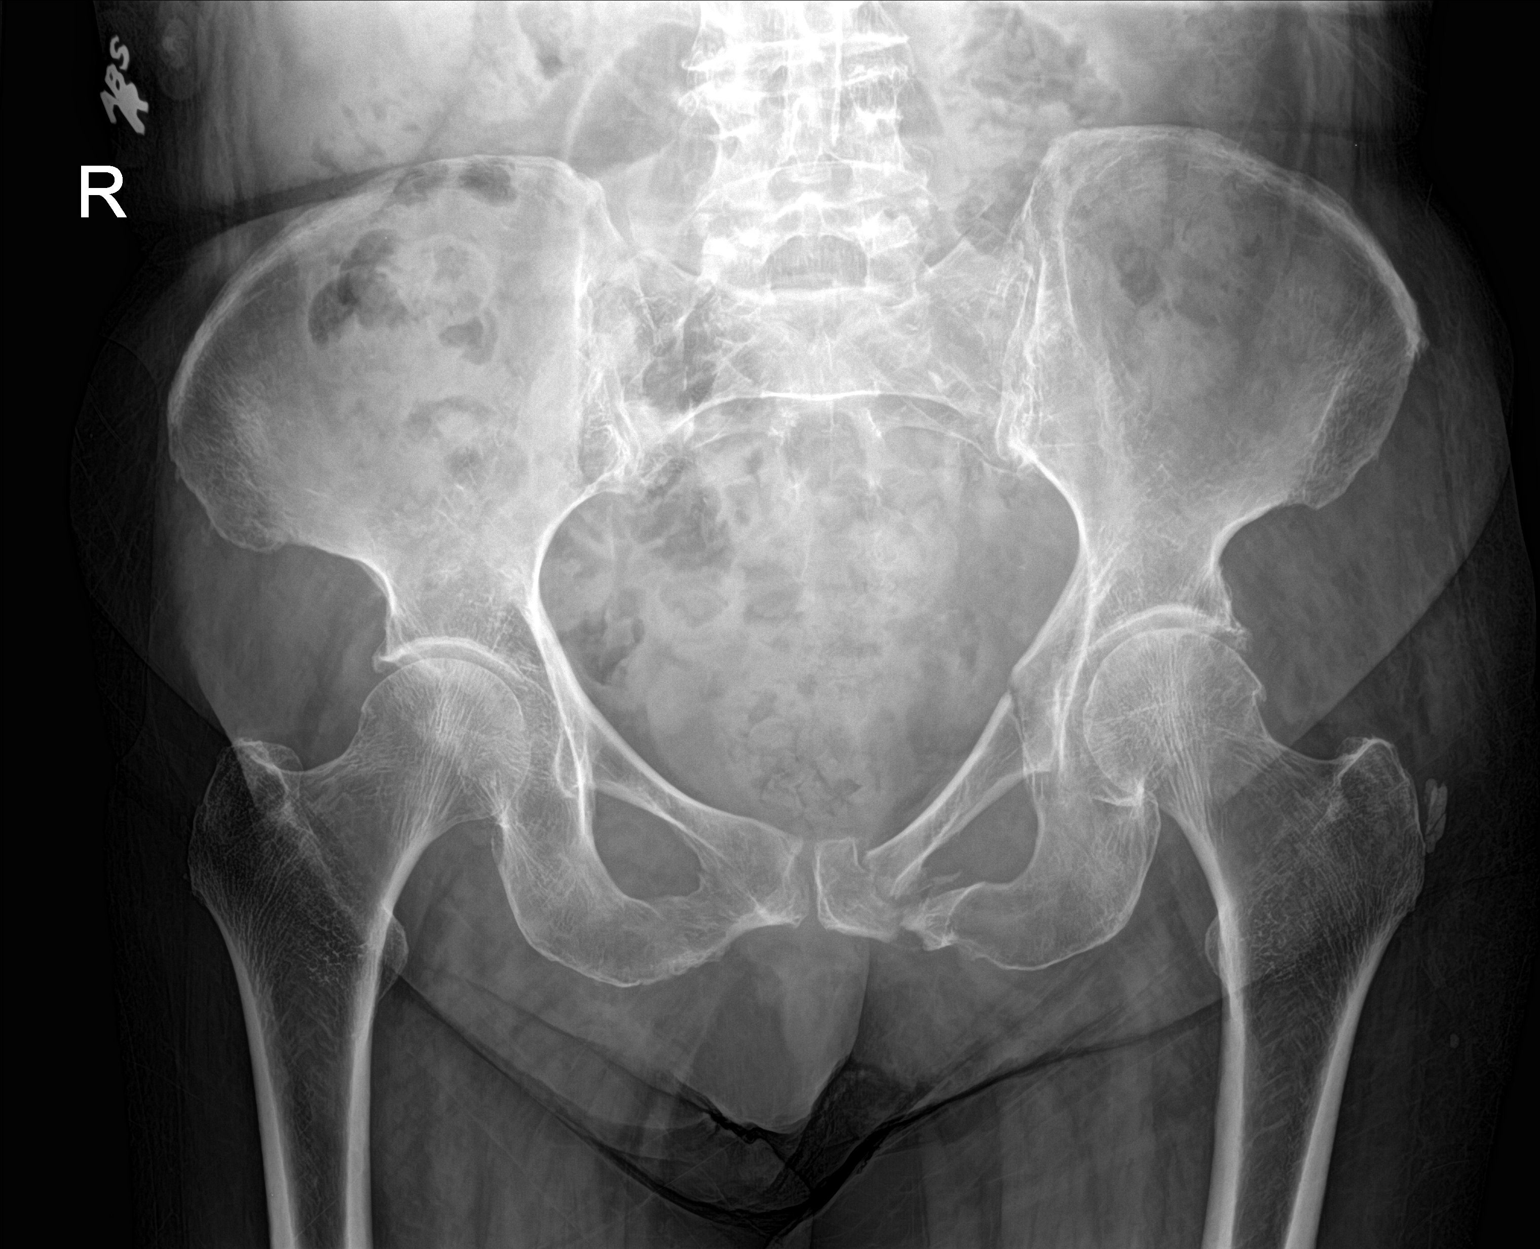

[hip ap]
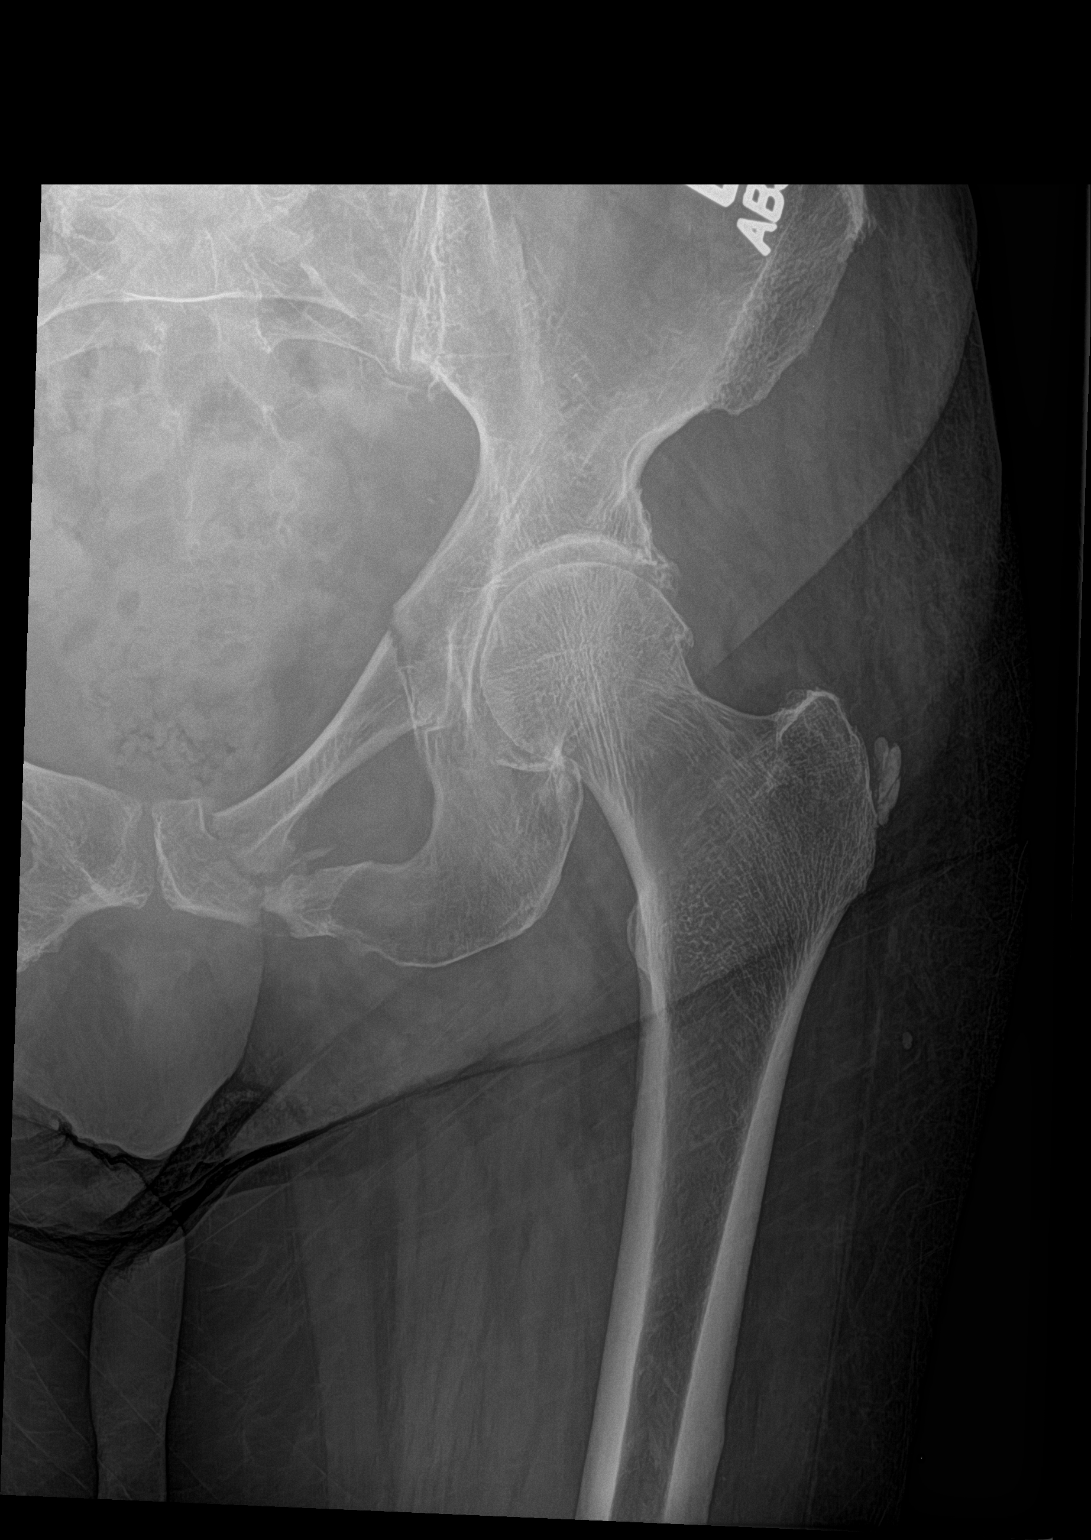

[hip lat]
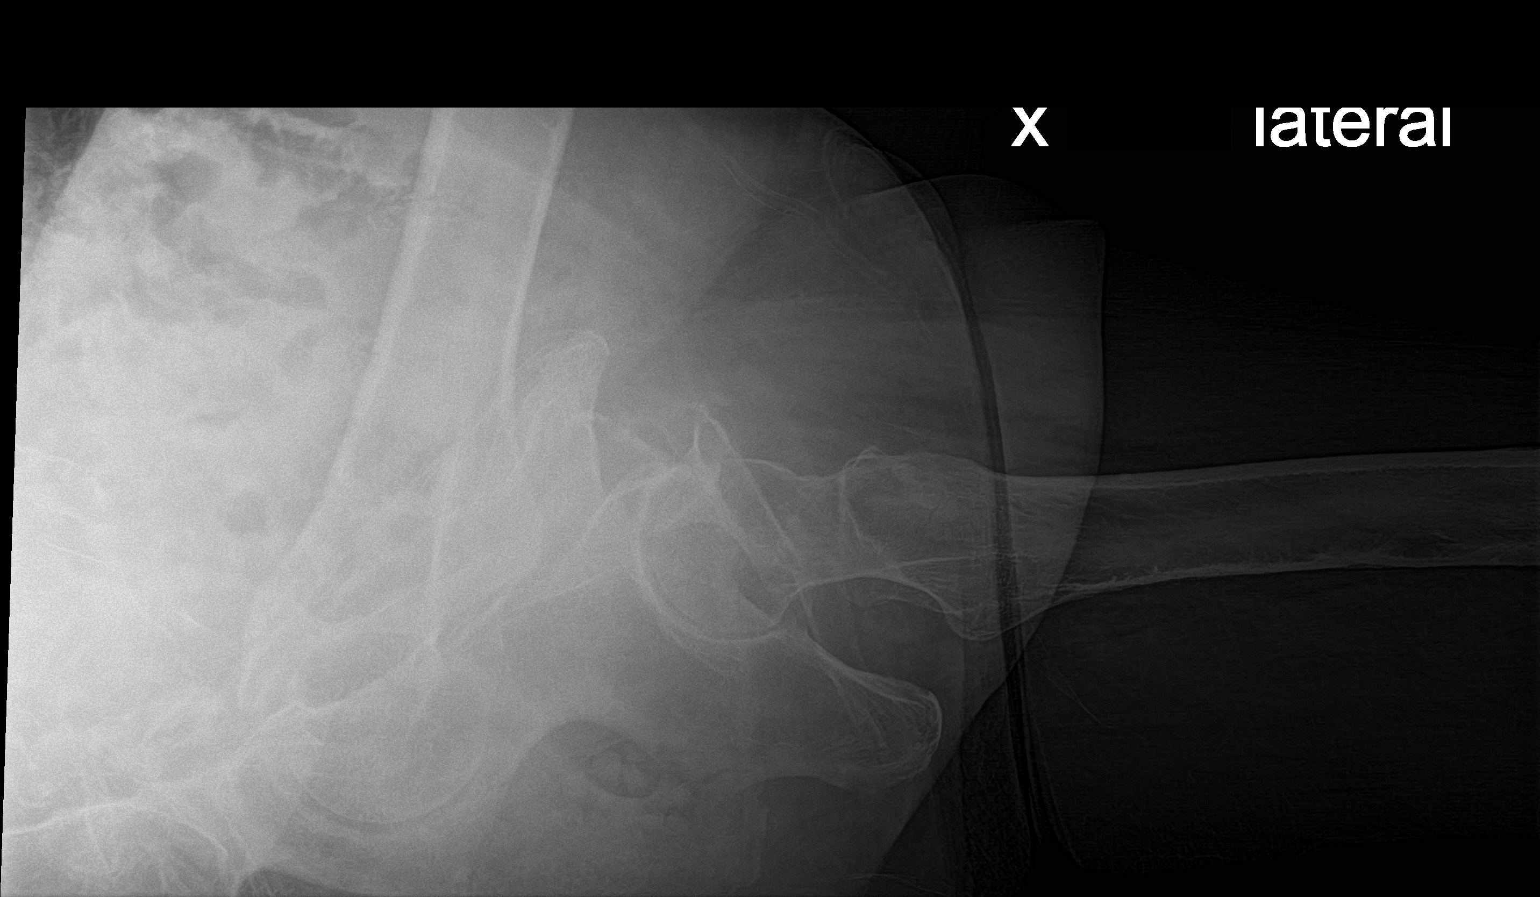

[3 of 3 positions shown; findings below may reference images not displayed]

FINDINGS: Acute fracture of the left obturator ring with segmental fracturing
of the superior pubic ramus at the body and puboacetabular junction
and comminuted fracturing at the medial inferior ramus. The superior
ramus is mildly displaced. No evidence of hip fracture or
dislocation. Based on fracture pattern a sacral insufficiency
fracture is expected but not visualized. Osteopenia. Left hip
osteoarthritis with spurring but no joint narrowing. Calcification
from left gluteal tendinitis at the greater trochanter.
IMPRESSION: Left obturator ring fractures as described above.

## 2016-08-30 DIAGNOSIS — R279 Unspecified lack of coordination: Secondary | ICD-10-CM | POA: Diagnosis not present

## 2016-08-30 DIAGNOSIS — M6281 Muscle weakness (generalized): Secondary | ICD-10-CM | POA: Diagnosis not present

## 2016-08-30 DIAGNOSIS — Z9181 History of falling: Secondary | ICD-10-CM | POA: Diagnosis not present

## 2016-08-31 DIAGNOSIS — M6281 Muscle weakness (generalized): Secondary | ICD-10-CM | POA: Diagnosis not present

## 2016-08-31 DIAGNOSIS — R279 Unspecified lack of coordination: Secondary | ICD-10-CM | POA: Diagnosis not present

## 2016-08-31 DIAGNOSIS — Z9181 History of falling: Secondary | ICD-10-CM | POA: Diagnosis not present

## 2016-09-01 DIAGNOSIS — R279 Unspecified lack of coordination: Secondary | ICD-10-CM | POA: Diagnosis not present

## 2016-09-01 DIAGNOSIS — M6281 Muscle weakness (generalized): Secondary | ICD-10-CM | POA: Diagnosis not present

## 2016-09-01 DIAGNOSIS — Z9181 History of falling: Secondary | ICD-10-CM | POA: Diagnosis not present

## 2016-09-03 DIAGNOSIS — M6281 Muscle weakness (generalized): Secondary | ICD-10-CM | POA: Diagnosis not present

## 2016-09-03 DIAGNOSIS — Z9181 History of falling: Secondary | ICD-10-CM | POA: Diagnosis not present

## 2016-09-03 DIAGNOSIS — R279 Unspecified lack of coordination: Secondary | ICD-10-CM | POA: Diagnosis not present

## 2016-09-06 DIAGNOSIS — R279 Unspecified lack of coordination: Secondary | ICD-10-CM | POA: Diagnosis not present

## 2016-09-06 DIAGNOSIS — Z9181 History of falling: Secondary | ICD-10-CM | POA: Diagnosis not present

## 2016-09-06 DIAGNOSIS — M6281 Muscle weakness (generalized): Secondary | ICD-10-CM | POA: Diagnosis not present

## 2016-09-07 DIAGNOSIS — R279 Unspecified lack of coordination: Secondary | ICD-10-CM | POA: Diagnosis not present

## 2016-09-07 DIAGNOSIS — M6281 Muscle weakness (generalized): Secondary | ICD-10-CM | POA: Diagnosis not present

## 2016-09-07 DIAGNOSIS — Z9181 History of falling: Secondary | ICD-10-CM | POA: Diagnosis not present

## 2016-09-09 DIAGNOSIS — R279 Unspecified lack of coordination: Secondary | ICD-10-CM | POA: Diagnosis not present

## 2016-09-09 DIAGNOSIS — Z9181 History of falling: Secondary | ICD-10-CM | POA: Diagnosis not present

## 2016-09-09 DIAGNOSIS — M6281 Muscle weakness (generalized): Secondary | ICD-10-CM | POA: Diagnosis not present

## 2016-09-10 DIAGNOSIS — M6281 Muscle weakness (generalized): Secondary | ICD-10-CM | POA: Diagnosis not present

## 2016-09-10 DIAGNOSIS — Z9181 History of falling: Secondary | ICD-10-CM | POA: Diagnosis not present

## 2016-09-10 DIAGNOSIS — R279 Unspecified lack of coordination: Secondary | ICD-10-CM | POA: Diagnosis not present

## 2016-11-02 DIAGNOSIS — R609 Edema, unspecified: Secondary | ICD-10-CM | POA: Diagnosis not present

## 2017-03-16 NOTE — Progress Notes (Signed)
This encounter was created in error - please disregard.

## 2017-04-07 ENCOUNTER — Encounter: Payer: Self-pay | Admitting: Cardiology

## 2017-04-26 ENCOUNTER — Encounter: Payer: Self-pay | Admitting: Cardiology

## 2017-04-26 ENCOUNTER — Ambulatory Visit (INDEPENDENT_AMBULATORY_CARE_PROVIDER_SITE_OTHER): Payer: 59 | Admitting: Cardiology

## 2017-04-26 VITALS — BP 168/70 | HR 63 | Ht 59.0 in | Wt 127.0 lb

## 2017-04-26 DIAGNOSIS — I2510 Atherosclerotic heart disease of native coronary artery without angina pectoris: Secondary | ICD-10-CM | POA: Diagnosis not present

## 2017-04-26 DIAGNOSIS — E782 Mixed hyperlipidemia: Secondary | ICD-10-CM | POA: Diagnosis not present

## 2017-04-26 DIAGNOSIS — I10 Essential (primary) hypertension: Secondary | ICD-10-CM

## 2017-04-26 NOTE — Patient Instructions (Signed)

## 2017-04-26 NOTE — Progress Notes (Signed)
Cardiology Office Note:    Date:  04/26/2017   ID:  Marisa Hicks, DOB [birth year], MRN [id number]  PCP:  Arnetha Courser, MD  Cardiologist:  Jenean Lindau, MD   Referring MD: Arnetha Courser, MD    ASSESSMENT:    1. Coronary artery disease, angina presence unspecified, unspecified vessel or lesion type, unspecified whether native or transplanted heart   2. Coronary artery disease involving native coronary artery of native heart without angina pectoris   3. Essential hypertension   4. Mixed hyperlipidemia    PLAN:    In order of problems listed above:  1. Coronary artery disease clinically stable. He can the prevention stressed to the patient. Importance of compliance with diet and medications stressed and she vocalized understanding. She and her doctor had multiple questions which were answered to their satisfaction. 2. Her blood pressure stable. Diet was discussed with dyslipidemia. We will obtain recent blood work from her primary care physician and also obtain a copy of the EKG and compare the present EKG that we did today. I discussed this with her and her family member.Patient will be seen in follow-up appointment in 6 months or earlier if the patient has any concerns.  Medication Adjustments/Labs and Tests Ordered: Current medicines are reviewed at length with the patient today.  Concerns regarding medicines are outlined above.  Orders Placed This Encounter  Procedures  . EKG 12-Lead   No orders of the defined types were placed in this encounter.    History of Present Illness:    Marisa Hicks is a 83 y.o. female who is being seen today for the evaluation of Coronary artery disease at the request of Lada, Satira Anis, MD. Patient is a pleasant 83 year old female. She is past medical history of coronary artery disease, essential hypertension and dyslipidemia. She lives in an assisted living facility. She is here for routine follow-up. She is a new patient to this practice.  She was seen and was under my care in the previous practice and she wants to transfer her care here. She denies any chest pain orthopnea or PND. The time of my evaluation she is alert awake oriented and in no distress. Her daughter accompanies her.   Past Medical History:  Diagnosis Date  . Allergy   . Anxiety   . Arthritis   . CAD (coronary artery disease)   . CHF (congestive heart failure) ([place])   . COPD (chronic obstructive pulmonary disease) ([place])   . Dysphagia   . Dyspnea   . Hyperlipidemia   . Hypertension   . Hypertriglyceridemia   . Hyponatremia   . Left ventricular hypertrophy   . Lymphocytosis   . Monocytosis   . Osteoarthritis   . Osteoporosis   . Proteinuria   . Pulmonary hypertension ([place])   . Renal disorder     Past Surgical History:  Procedure Laterality Date  . APPENDECTOMY    . right knee surgery    . TONSILLECTOMY    . TUBAL LIGATION      Current Medications: Current Meds  Medication Sig  . acetaminophen (TYLENOL) 500 MG tablet Take 1,000 mg by mouth 2 (two) times daily.  Marland Kitchen acidophilus (RISAQUAD) CAPS capsule Take 1 capsule by mouth daily.  Marland Kitchen albuterol (PROVENTIL HFA;VENTOLIN HFA) 108 (90 BASE) MCG/ACT inhaler Inhale 2 puffs into the lungs every 6 (six) hours as needed for wheezing or shortness of breath.  Marland Kitchen alendronate (FOSAMAX) 70 MG tablet Take 70 mg by mouth once  a week. Take with a full glass of water on an empty stomach.  . ALPRAZolam (XANAX) 0.25 MG tablet Take 1 tablet (0.25 mg total) by mouth at bedtime.  Marland Kitchen amLODipine (NORVASC) 10 MG tablet Take 10 mg by mouth daily.  . calcium-vitamin D (OSCAL WITH D) 500-200 MG-UNIT per tablet Take 1 tablet by mouth daily.   . Cholecalciferol (VITAMIN D3) 2000 UNITS TABS Take 2,000 Units by mouth daily.  . clopidogrel (PLAVIX) 75 MG tablet Take 75 mg by mouth daily.  Marland Kitchen Dextromethorphan HBr 10 MG/5ML SYRP Take 5 mLs by mouth every 6 (six) hours as needed.  . fexofenadine (ALLEGRA) 180 MG tablet Take 180 mg by  mouth daily as needed.   . fluticasone (FLONASE) 50 MCG/ACT nasal spray Place 1 spray into both nostrils 2 (two) times daily.  . fluticasone furoate-vilanterol (BREO ELLIPTA) 200-25 MCG/INH AEPB Inhale 1 puff into the lungs daily.  . furosemide (LASIX) 20 MG tablet Take 20 mg by mouth daily.  Marland Kitchen guaiFENesin (MUCINEX) 600 MG 12 hr tablet Take 1,200 mg by mouth 2 (two) times daily.  Marland Kitchen ipratropium-albuterol (DUONEB) 0.5-2.5 (3) MG/3ML SOLN Take 3 mLs by nebulization every 6 (six) hours as needed.  . isosorbide dinitrate (ISORDIL) 30 MG tablet Take 30 mg by mouth 4 (four) times daily.  Marland Kitchen lidocaine (LIDODERM) 5 % Place 1 patch onto the skin daily. Remove & Discard patch within 12 hours or as directed by MD  . meclizine (ANTIVERT) 25 MG tablet Take 25 mg by mouth daily as needed for dizziness.  . Melatonin 3 MG TABS Take 3 mg by mouth at bedtime as needed.   . methocarbamol (ROBAXIN) 500 MG tablet Take 500 mg by mouth every 8 (eight) hours as needed for muscle spasms.  . mometasone (NASONEX) 50 MCG/ACT nasal spray Place 2 sprays into the nose at bedtime.   . montelukast (SINGULAIR) 10 MG tablet Take 10 mg by mouth at bedtime.  . Multiple Vitamins-Minerals (MULTIVITAMIN WITH MINERALS) tablet Take 1 tablet by mouth daily.  Marland Kitchen olmesartan (BENICAR) 40 MG tablet Take 40 mg by mouth daily.  Marland Kitchen omeprazole (PRILOSEC) 40 MG capsule Take 40 mg by mouth daily.  Vladimir Faster Glycol-Propyl Glycol (SYSTANE) 0.4-0.3 % SOLN Apply 1 drop to eye 2 (two) times daily. To both eyes  . Polyethyl Glycol-Propyl Glycol 0.4-0.3 % SOLN Apply 1 drop to eye daily.  . polyethylene glycol (MIRALAX / GLYCOLAX) packet Take 17 g by mouth daily.  . potassium chloride (K-DUR,KLOR-CON) 10 MEQ tablet Take 10 mEq by mouth daily.  . raloxifene (EVISTA) 60 MG tablet Take 60 mg by mouth daily.  . simvastatin (ZOCOR) 40 MG tablet Take 40 mg by mouth at bedtime.   . sotalol (BETAPACE) 80 MG tablet Take 1 tablet (80 mg total) by mouth every 12  (twelve) hours.  . [DISCONTINUED] albuterol (PROVENTIL) (2.5 MG/3ML) 0.083% nebulizer solution Take 3 mLs (2.5 mg total) by nebulization every 4 (four) hours as needed for wheezing or shortness of breath. Do not use with your rescue inhaler; just one or the other     Allergies:   Ace inhibitors; Aliskiren-hydrochlorothiazide; Altace [ramipril]; Atorvastatin; Hydralazine hcl; Hydrochlorothiazide; Irbesartan-hydrochlorothiazide; Labetalol hcl; Rosuvastatin; and Tribenzor [olmesartan-amlodipine-hctz]   Social History   Social History  . Marital status: Widowed    Spouse name: N/A  . Number of children: N/A  . Years of education: N/A   Social History Main Topics  . Smoking status: Never Smoker  . Smokeless tobacco: Never Used  .  Alcohol use No  . Drug use: No  . Sexual activity: Not Asked   Other Topics Concern  . None   Social History Narrative  . None     Family History: The patient's family history includes COPD in her daughter, maternal grandfather, and sister; Cancer in her sister; Emphysema in her father; Heart disease in her paternal grandfather; Hypertension in her father; Stroke in her brother.  ROS:   Please see the history of present illness.    All other systems reviewed and are negative.  EKGs/Labs/Other Studies Reviewed:    The following studies were reviewed today: Sinus rhythm on specific ST-T changes and QT shows a prolongation. I requested labs and prior EKGs from her primary care doctor and my previous practice for comparison.    Recent Labs: No results found for requested labs within last 8760 hours.  Recent Lipid Panel    Component Value Date/Time   CHOL 169 09/13/2015 0536   TRIG 228 (H) 09/13/2015 0536   HDL 60 09/13/2015 0536   CHOLHDL 2.8 09/13/2015 0536   VLDL 46 (H) 09/13/2015 0536   LDLCALC 63 09/13/2015 0536    Physical Exam:    VS:  BP (!) 168/70   Pulse 63   Ht 4\' 11"  (1.499 m)   Wt 127 lb (57.6 kg)   SpO2 97%   BMI 25.65 kg/m      Wt Readings from Last 3 Encounters:  04/26/17 127 lb (57.6 kg)  10/30/15 126 lb 8 oz (57.4 kg)  09/12/15 122 lb (55.3 kg)     GEN: Patient is in no acute distress HEENT: Normal NECK: No JVD; No carotid bruits LYMPHATICS: No lymphadenopathy CARDIAC: S1 S2 regular, 2/6 systolic murmur at the apex. RESPIRATORY:  Clear to auscultation without rales, wheezing or rhonchi  ABDOMEN: Soft, non-tender, non-distended MUSCULOSKELETAL:  No edema; No deformity  SKIN: Warm and dry NEUROLOGIC:  Alert and oriented x 3 PSYCHIATRIC:  Normal affect    Signed, Jenean Lindau, MD  04/26/2017 3:08 PM    [place]

## 2017-08-11 DIAGNOSIS — J441 Chronic obstructive pulmonary disease with (acute) exacerbation: Secondary | ICD-10-CM | POA: Diagnosis not present

## 2017-08-11 DIAGNOSIS — I2510 Atherosclerotic heart disease of native coronary artery without angina pectoris: Secondary | ICD-10-CM | POA: Diagnosis not present

## 2017-08-11 DIAGNOSIS — E785 Hyperlipidemia, unspecified: Secondary | ICD-10-CM

## 2017-08-11 DIAGNOSIS — I10 Essential (primary) hypertension: Secondary | ICD-10-CM | POA: Diagnosis not present

## 2017-08-11 DIAGNOSIS — N179 Acute kidney failure, unspecified: Secondary | ICD-10-CM | POA: Diagnosis not present

## 2017-08-11 DIAGNOSIS — R978 Other abnormal tumor markers: Secondary | ICD-10-CM | POA: Diagnosis not present

## 2017-08-11 DIAGNOSIS — E860 Dehydration: Secondary | ICD-10-CM | POA: Diagnosis not present

## 2017-08-12 DIAGNOSIS — E785 Hyperlipidemia, unspecified: Secondary | ICD-10-CM | POA: Diagnosis not present

## 2017-08-12 DIAGNOSIS — J441 Chronic obstructive pulmonary disease with (acute) exacerbation: Secondary | ICD-10-CM | POA: Diagnosis not present

## 2017-08-12 DIAGNOSIS — N179 Acute kidney failure, unspecified: Secondary | ICD-10-CM | POA: Diagnosis not present

## 2017-08-12 DIAGNOSIS — I2510 Atherosclerotic heart disease of native coronary artery without angina pectoris: Secondary | ICD-10-CM | POA: Diagnosis not present

## 2017-08-13 DIAGNOSIS — R978 Other abnormal tumor markers: Secondary | ICD-10-CM | POA: Diagnosis not present

## 2017-08-13 DIAGNOSIS — E785 Hyperlipidemia, unspecified: Secondary | ICD-10-CM | POA: Diagnosis not present

## 2017-08-13 DIAGNOSIS — I2510 Atherosclerotic heart disease of native coronary artery without angina pectoris: Secondary | ICD-10-CM | POA: Diagnosis not present

## 2017-08-13 DIAGNOSIS — N179 Acute kidney failure, unspecified: Secondary | ICD-10-CM | POA: Diagnosis not present

## 2017-08-13 DIAGNOSIS — E860 Dehydration: Secondary | ICD-10-CM | POA: Diagnosis not present

## 2017-08-13 DIAGNOSIS — J441 Chronic obstructive pulmonary disease with (acute) exacerbation: Secondary | ICD-10-CM | POA: Diagnosis not present

## 2017-08-13 DIAGNOSIS — I10 Essential (primary) hypertension: Secondary | ICD-10-CM | POA: Diagnosis not present

## 2017-08-14 DIAGNOSIS — J441 Chronic obstructive pulmonary disease with (acute) exacerbation: Secondary | ICD-10-CM | POA: Diagnosis not present

## 2017-08-14 DIAGNOSIS — I2510 Atherosclerotic heart disease of native coronary artery without angina pectoris: Secondary | ICD-10-CM | POA: Diagnosis not present

## 2017-08-14 DIAGNOSIS — N179 Acute kidney failure, unspecified: Secondary | ICD-10-CM | POA: Diagnosis not present

## 2017-08-14 DIAGNOSIS — E785 Hyperlipidemia, unspecified: Secondary | ICD-10-CM | POA: Diagnosis not present

## 2017-08-15 DIAGNOSIS — I2510 Atherosclerotic heart disease of native coronary artery without angina pectoris: Secondary | ICD-10-CM | POA: Diagnosis not present

## 2017-08-15 DIAGNOSIS — E785 Hyperlipidemia, unspecified: Secondary | ICD-10-CM | POA: Diagnosis not present

## 2017-08-15 DIAGNOSIS — N179 Acute kidney failure, unspecified: Secondary | ICD-10-CM | POA: Diagnosis not present

## 2017-08-15 DIAGNOSIS — J441 Chronic obstructive pulmonary disease with (acute) exacerbation: Secondary | ICD-10-CM | POA: Diagnosis not present

## 2017-08-16 DIAGNOSIS — N179 Acute kidney failure, unspecified: Secondary | ICD-10-CM | POA: Diagnosis not present

## 2017-08-16 DIAGNOSIS — R0602 Shortness of breath: Secondary | ICD-10-CM

## 2017-08-16 DIAGNOSIS — I2510 Atherosclerotic heart disease of native coronary artery without angina pectoris: Secondary | ICD-10-CM | POA: Diagnosis not present

## 2017-08-16 DIAGNOSIS — J441 Chronic obstructive pulmonary disease with (acute) exacerbation: Secondary | ICD-10-CM | POA: Diagnosis not present

## 2017-08-16 DIAGNOSIS — E785 Hyperlipidemia, unspecified: Secondary | ICD-10-CM | POA: Diagnosis not present

## 2017-08-17 DIAGNOSIS — E860 Dehydration: Secondary | ICD-10-CM | POA: Diagnosis not present

## 2017-08-17 DIAGNOSIS — E785 Hyperlipidemia, unspecified: Secondary | ICD-10-CM | POA: Diagnosis not present

## 2017-08-17 DIAGNOSIS — I2510 Atherosclerotic heart disease of native coronary artery without angina pectoris: Secondary | ICD-10-CM | POA: Diagnosis not present

## 2017-08-17 DIAGNOSIS — I10 Essential (primary) hypertension: Secondary | ICD-10-CM | POA: Diagnosis not present

## 2017-08-17 DIAGNOSIS — R978 Other abnormal tumor markers: Secondary | ICD-10-CM | POA: Diagnosis not present

## 2017-08-17 DIAGNOSIS — J441 Chronic obstructive pulmonary disease with (acute) exacerbation: Secondary | ICD-10-CM | POA: Diagnosis not present

## 2017-08-17 DIAGNOSIS — N179 Acute kidney failure, unspecified: Secondary | ICD-10-CM | POA: Diagnosis not present

## 2017-08-18 DIAGNOSIS — R978 Other abnormal tumor markers: Secondary | ICD-10-CM | POA: Diagnosis not present

## 2017-08-18 DIAGNOSIS — N179 Acute kidney failure, unspecified: Secondary | ICD-10-CM | POA: Diagnosis not present

## 2017-08-18 DIAGNOSIS — J441 Chronic obstructive pulmonary disease with (acute) exacerbation: Secondary | ICD-10-CM | POA: Diagnosis not present

## 2017-08-18 DIAGNOSIS — E860 Dehydration: Secondary | ICD-10-CM | POA: Diagnosis not present

## 2017-08-18 DIAGNOSIS — I2510 Atherosclerotic heart disease of native coronary artery without angina pectoris: Secondary | ICD-10-CM | POA: Diagnosis not present

## 2017-08-18 DIAGNOSIS — I10 Essential (primary) hypertension: Secondary | ICD-10-CM | POA: Diagnosis not present

## 2017-08-18 DIAGNOSIS — E785 Hyperlipidemia, unspecified: Secondary | ICD-10-CM | POA: Diagnosis not present

## 2017-08-19 DIAGNOSIS — N179 Acute kidney failure, unspecified: Secondary | ICD-10-CM | POA: Diagnosis not present

## 2017-08-19 DIAGNOSIS — E785 Hyperlipidemia, unspecified: Secondary | ICD-10-CM | POA: Diagnosis not present

## 2017-08-19 DIAGNOSIS — I2510 Atherosclerotic heart disease of native coronary artery without angina pectoris: Secondary | ICD-10-CM | POA: Diagnosis not present

## 2017-08-19 DIAGNOSIS — J441 Chronic obstructive pulmonary disease with (acute) exacerbation: Secondary | ICD-10-CM | POA: Diagnosis not present

## 2017-08-20 DIAGNOSIS — I10 Essential (primary) hypertension: Secondary | ICD-10-CM | POA: Diagnosis not present

## 2017-08-20 DIAGNOSIS — J441 Chronic obstructive pulmonary disease with (acute) exacerbation: Secondary | ICD-10-CM | POA: Diagnosis not present

## 2017-08-20 DIAGNOSIS — N179 Acute kidney failure, unspecified: Secondary | ICD-10-CM | POA: Diagnosis not present

## 2017-08-20 DIAGNOSIS — I2510 Atherosclerotic heart disease of native coronary artery without angina pectoris: Secondary | ICD-10-CM | POA: Diagnosis not present

## 2017-08-20 DIAGNOSIS — E785 Hyperlipidemia, unspecified: Secondary | ICD-10-CM | POA: Diagnosis not present

## 2017-08-20 DIAGNOSIS — E860 Dehydration: Secondary | ICD-10-CM | POA: Diagnosis not present

## 2017-08-20 DIAGNOSIS — R978 Other abnormal tumor markers: Secondary | ICD-10-CM | POA: Diagnosis not present

## 2017-08-21 DIAGNOSIS — E785 Hyperlipidemia, unspecified: Secondary | ICD-10-CM | POA: Diagnosis not present

## 2017-08-21 DIAGNOSIS — I10 Essential (primary) hypertension: Secondary | ICD-10-CM | POA: Diagnosis not present

## 2017-08-21 DIAGNOSIS — I2510 Atherosclerotic heart disease of native coronary artery without angina pectoris: Secondary | ICD-10-CM | POA: Diagnosis not present

## 2017-08-21 DIAGNOSIS — E860 Dehydration: Secondary | ICD-10-CM | POA: Diagnosis not present

## 2017-08-21 DIAGNOSIS — J441 Chronic obstructive pulmonary disease with (acute) exacerbation: Secondary | ICD-10-CM | POA: Diagnosis not present

## 2017-08-21 DIAGNOSIS — R978 Other abnormal tumor markers: Secondary | ICD-10-CM | POA: Diagnosis not present

## 2017-08-21 DIAGNOSIS — N179 Acute kidney failure, unspecified: Secondary | ICD-10-CM | POA: Diagnosis not present

## 2017-08-22 DIAGNOSIS — I2510 Atherosclerotic heart disease of native coronary artery without angina pectoris: Secondary | ICD-10-CM | POA: Diagnosis not present

## 2017-08-22 DIAGNOSIS — N179 Acute kidney failure, unspecified: Secondary | ICD-10-CM | POA: Diagnosis not present

## 2017-08-22 DIAGNOSIS — E785 Hyperlipidemia, unspecified: Secondary | ICD-10-CM | POA: Diagnosis not present

## 2017-08-22 DIAGNOSIS — J441 Chronic obstructive pulmonary disease with (acute) exacerbation: Secondary | ICD-10-CM | POA: Diagnosis not present

## 2017-08-23 DIAGNOSIS — N179 Acute kidney failure, unspecified: Secondary | ICD-10-CM | POA: Diagnosis not present

## 2017-08-23 DIAGNOSIS — J441 Chronic obstructive pulmonary disease with (acute) exacerbation: Secondary | ICD-10-CM | POA: Diagnosis not present

## 2017-08-23 DIAGNOSIS — I2510 Atherosclerotic heart disease of native coronary artery without angina pectoris: Secondary | ICD-10-CM | POA: Diagnosis not present

## 2017-08-23 DIAGNOSIS — E785 Hyperlipidemia, unspecified: Secondary | ICD-10-CM | POA: Diagnosis not present

## 2017-08-24 DIAGNOSIS — J441 Chronic obstructive pulmonary disease with (acute) exacerbation: Secondary | ICD-10-CM | POA: Diagnosis not present

## 2017-08-24 DIAGNOSIS — I2510 Atherosclerotic heart disease of native coronary artery without angina pectoris: Secondary | ICD-10-CM | POA: Diagnosis not present

## 2017-08-24 DIAGNOSIS — E785 Hyperlipidemia, unspecified: Secondary | ICD-10-CM | POA: Diagnosis not present

## 2017-08-24 DIAGNOSIS — N179 Acute kidney failure, unspecified: Secondary | ICD-10-CM | POA: Diagnosis not present

## 2017-08-25 DIAGNOSIS — E785 Hyperlipidemia, unspecified: Secondary | ICD-10-CM | POA: Diagnosis not present

## 2017-08-25 DIAGNOSIS — J441 Chronic obstructive pulmonary disease with (acute) exacerbation: Secondary | ICD-10-CM | POA: Diagnosis not present

## 2017-08-25 DIAGNOSIS — I2510 Atherosclerotic heart disease of native coronary artery without angina pectoris: Secondary | ICD-10-CM | POA: Diagnosis not present

## 2017-08-25 DIAGNOSIS — N179 Acute kidney failure, unspecified: Secondary | ICD-10-CM | POA: Diagnosis not present

## 2017-08-26 DIAGNOSIS — Z8739 Personal history of other diseases of the musculoskeletal system and connective tissue: Secondary | ICD-10-CM | POA: Diagnosis not present

## 2017-08-26 DIAGNOSIS — Z8659 Personal history of other mental and behavioral disorders: Secondary | ICD-10-CM | POA: Diagnosis not present

## 2017-08-26 DIAGNOSIS — Z8679 Personal history of other diseases of the circulatory system: Secondary | ICD-10-CM | POA: Diagnosis not present

## 2017-08-26 DIAGNOSIS — J189 Pneumonia, unspecified organism: Secondary | ICD-10-CM | POA: Diagnosis not present

## 2017-08-26 DIAGNOSIS — J441 Chronic obstructive pulmonary disease with (acute) exacerbation: Secondary | ICD-10-CM | POA: Diagnosis not present

## 2017-08-26 DIAGNOSIS — K922 Gastrointestinal hemorrhage, unspecified: Secondary | ICD-10-CM | POA: Diagnosis not present

## 2017-08-26 DIAGNOSIS — Z87440 Personal history of urinary (tract) infections: Secondary | ICD-10-CM | POA: Diagnosis not present

## 2017-08-26 DIAGNOSIS — Z8673 Personal history of transient ischemic attack (TIA), and cerebral infarction without residual deficits: Secondary | ICD-10-CM | POA: Diagnosis not present

## 2017-08-26 DIAGNOSIS — I2510 Atherosclerotic heart disease of native coronary artery without angina pectoris: Secondary | ICD-10-CM | POA: Diagnosis not present

## 2017-08-28 DIAGNOSIS — J189 Pneumonia, unspecified organism: Secondary | ICD-10-CM | POA: Diagnosis not present

## 2017-08-28 DIAGNOSIS — K922 Gastrointestinal hemorrhage, unspecified: Secondary | ICD-10-CM | POA: Diagnosis not present

## 2017-08-28 DIAGNOSIS — Z8679 Personal history of other diseases of the circulatory system: Secondary | ICD-10-CM | POA: Diagnosis not present

## 2017-08-28 DIAGNOSIS — I2510 Atherosclerotic heart disease of native coronary artery without angina pectoris: Secondary | ICD-10-CM | POA: Diagnosis not present

## 2017-08-28 DIAGNOSIS — Z87440 Personal history of urinary (tract) infections: Secondary | ICD-10-CM | POA: Diagnosis not present

## 2017-08-28 DIAGNOSIS — J441 Chronic obstructive pulmonary disease with (acute) exacerbation: Secondary | ICD-10-CM | POA: Diagnosis not present

## 2017-08-29 DIAGNOSIS — K922 Gastrointestinal hemorrhage, unspecified: Secondary | ICD-10-CM | POA: Diagnosis not present

## 2017-08-29 DIAGNOSIS — J441 Chronic obstructive pulmonary disease with (acute) exacerbation: Secondary | ICD-10-CM | POA: Diagnosis not present

## 2017-08-29 DIAGNOSIS — Z87440 Personal history of urinary (tract) infections: Secondary | ICD-10-CM | POA: Diagnosis not present

## 2017-08-29 DIAGNOSIS — Z8679 Personal history of other diseases of the circulatory system: Secondary | ICD-10-CM | POA: Diagnosis not present

## 2017-08-29 DIAGNOSIS — I2510 Atherosclerotic heart disease of native coronary artery without angina pectoris: Secondary | ICD-10-CM | POA: Diagnosis not present

## 2017-08-29 DIAGNOSIS — J189 Pneumonia, unspecified organism: Secondary | ICD-10-CM | POA: Diagnosis not present

## 2017-08-30 DIAGNOSIS — I2510 Atherosclerotic heart disease of native coronary artery without angina pectoris: Secondary | ICD-10-CM | POA: Diagnosis not present

## 2017-08-30 DIAGNOSIS — J189 Pneumonia, unspecified organism: Secondary | ICD-10-CM | POA: Diagnosis not present

## 2017-08-30 DIAGNOSIS — J441 Chronic obstructive pulmonary disease with (acute) exacerbation: Secondary | ICD-10-CM | POA: Diagnosis not present

## 2017-08-30 DIAGNOSIS — Z87440 Personal history of urinary (tract) infections: Secondary | ICD-10-CM | POA: Diagnosis not present

## 2017-08-30 DIAGNOSIS — Z8679 Personal history of other diseases of the circulatory system: Secondary | ICD-10-CM | POA: Diagnosis not present

## 2017-08-30 DIAGNOSIS — K922 Gastrointestinal hemorrhage, unspecified: Secondary | ICD-10-CM | POA: Diagnosis not present

## 2017-09-03 DEATH — deceased
